# Patient Record
Sex: Female | Born: 1955
Health system: Southern US, Community
[De-identification: ages and names within clinical notes are randomized; demographics above are authoritative.]

## PROBLEM LIST (undated history)

## (undated) DIAGNOSIS — M1990 Unspecified osteoarthritis, unspecified site: Secondary | ICD-10-CM

## (undated) DIAGNOSIS — Z21 Asymptomatic human immunodeficiency virus [HIV] infection status: Secondary | ICD-10-CM

## (undated) DIAGNOSIS — E119 Type 2 diabetes mellitus without complications: Secondary | ICD-10-CM

## (undated) DIAGNOSIS — B20 Human immunodeficiency virus [HIV] disease: Secondary | ICD-10-CM

## (undated) DIAGNOSIS — R200 Anesthesia of skin: Secondary | ICD-10-CM

## (undated) DIAGNOSIS — M21372 Foot drop, left foot: Secondary | ICD-10-CM

## (undated) DIAGNOSIS — E785 Hyperlipidemia, unspecified: Secondary | ICD-10-CM

## (undated) DIAGNOSIS — K219 Gastro-esophageal reflux disease without esophagitis: Secondary | ICD-10-CM

## (undated) DIAGNOSIS — F419 Anxiety disorder, unspecified: Secondary | ICD-10-CM

## (undated) HISTORY — DX: Foot drop, left foot: M21.372

## (undated) HISTORY — PX: TONSILLECTOMY: SUR1361

## (undated) HISTORY — DX: Anesthesia of skin: R20.0

## (undated) HISTORY — DX: Human immunodeficiency virus (HIV) disease: B20

## (undated) HISTORY — PX: FOOT SURGERY: SHX648

## (undated) HISTORY — DX: Anxiety disorder, unspecified: F41.9

## (undated) HISTORY — DX: Unspecified osteoarthritis, unspecified site: M19.90

## (undated) HISTORY — PX: CHOLECYSTECTOMY: SHX55

## (undated) HISTORY — DX: Gastro-esophageal reflux disease without esophagitis: K21.9

## (undated) HISTORY — DX: Hyperlipidemia, unspecified: E78.5

---

## 1998-05-06 ENCOUNTER — Ambulatory Visit (HOSPITAL_COMMUNITY): Admission: RE | Admit: 1998-05-06 | Discharge: 1998-05-06 | Payer: Self-pay | Admitting: Family Medicine

## 1999-01-14 ENCOUNTER — Other Ambulatory Visit: Admission: RE | Admit: 1999-01-14 | Discharge: 1999-01-14 | Payer: Self-pay | Admitting: Obstetrics and Gynecology

## 1999-05-03 ENCOUNTER — Ambulatory Visit (HOSPITAL_COMMUNITY): Admission: RE | Admit: 1999-05-03 | Discharge: 1999-05-03 | Payer: Self-pay | Admitting: Obstetrics and Gynecology

## 1999-05-03 ENCOUNTER — Encounter (INDEPENDENT_AMBULATORY_CARE_PROVIDER_SITE_OTHER): Payer: Self-pay | Admitting: Specialist

## 1999-05-26 ENCOUNTER — Other Ambulatory Visit: Admission: RE | Admit: 1999-05-26 | Discharge: 1999-05-26 | Payer: Self-pay | Admitting: Obstetrics and Gynecology

## 1999-09-26 ENCOUNTER — Encounter: Payer: Self-pay | Admitting: Family Medicine

## 1999-09-26 ENCOUNTER — Encounter: Admission: RE | Admit: 1999-09-26 | Discharge: 1999-09-26 | Payer: Self-pay | Admitting: Family Medicine

## 2000-01-11 ENCOUNTER — Encounter (INDEPENDENT_AMBULATORY_CARE_PROVIDER_SITE_OTHER): Payer: Self-pay | Admitting: *Deleted

## 2000-01-11 ENCOUNTER — Encounter: Payer: Self-pay | Admitting: Gastroenterology

## 2000-01-11 ENCOUNTER — Ambulatory Visit (HOSPITAL_COMMUNITY): Admission: RE | Admit: 2000-01-11 | Discharge: 2000-01-11 | Payer: Self-pay | Admitting: Gastroenterology

## 2000-06-21 ENCOUNTER — Other Ambulatory Visit: Admission: RE | Admit: 2000-06-21 | Discharge: 2000-06-21 | Payer: Self-pay | Admitting: Obstetrics and Gynecology

## 2002-03-27 ENCOUNTER — Encounter: Payer: Self-pay | Admitting: Family Medicine

## 2002-03-27 ENCOUNTER — Ambulatory Visit (HOSPITAL_COMMUNITY): Admission: RE | Admit: 2002-03-27 | Discharge: 2002-03-27 | Payer: Self-pay | Admitting: Family Medicine

## 2002-04-29 ENCOUNTER — Ambulatory Visit (HOSPITAL_COMMUNITY): Admission: RE | Admit: 2002-04-29 | Discharge: 2002-04-29 | Payer: Self-pay | Admitting: Gastroenterology

## 2002-06-03 ENCOUNTER — Other Ambulatory Visit: Admission: RE | Admit: 2002-06-03 | Discharge: 2002-06-03 | Payer: Self-pay | Admitting: Obstetrics and Gynecology

## 2002-06-06 ENCOUNTER — Encounter: Admission: RE | Admit: 2002-06-06 | Discharge: 2002-06-06 | Payer: Self-pay | Admitting: Obstetrics and Gynecology

## 2002-06-06 ENCOUNTER — Encounter: Payer: Self-pay | Admitting: Obstetrics and Gynecology

## 2002-07-01 ENCOUNTER — Encounter: Payer: Self-pay | Admitting: *Deleted

## 2002-07-01 ENCOUNTER — Ambulatory Visit (HOSPITAL_COMMUNITY): Admission: RE | Admit: 2002-07-01 | Discharge: 2002-07-01 | Payer: Self-pay | Admitting: *Deleted

## 2003-02-07 ENCOUNTER — Encounter: Payer: Self-pay | Admitting: Family Medicine

## 2003-02-07 ENCOUNTER — Ambulatory Visit (HOSPITAL_COMMUNITY): Admission: RE | Admit: 2003-02-07 | Discharge: 2003-02-07 | Payer: Self-pay | Admitting: Family Medicine

## 2003-07-08 ENCOUNTER — Encounter: Payer: Self-pay | Admitting: Obstetrics and Gynecology

## 2003-07-08 ENCOUNTER — Encounter: Admission: RE | Admit: 2003-07-08 | Discharge: 2003-07-08 | Payer: Self-pay | Admitting: Obstetrics and Gynecology

## 2003-09-10 ENCOUNTER — Other Ambulatory Visit: Admission: RE | Admit: 2003-09-10 | Discharge: 2003-09-10 | Payer: Self-pay | Admitting: Obstetrics and Gynecology

## 2004-09-28 ENCOUNTER — Other Ambulatory Visit: Admission: RE | Admit: 2004-09-28 | Discharge: 2004-09-28 | Payer: Self-pay | Admitting: Obstetrics and Gynecology

## 2006-02-28 ENCOUNTER — Encounter: Admission: RE | Admit: 2006-02-28 | Discharge: 2006-02-28 | Payer: Self-pay | Admitting: Obstetrics and Gynecology

## 2009-05-06 ENCOUNTER — Encounter: Admission: RE | Admit: 2009-05-06 | Discharge: 2009-05-06 | Payer: Self-pay | Admitting: Family Medicine

## 2009-11-25 ENCOUNTER — Ambulatory Visit (HOSPITAL_COMMUNITY): Admission: RE | Admit: 2009-11-25 | Discharge: 2009-11-25 | Payer: Self-pay | Admitting: Gastroenterology

## 2009-12-06 ENCOUNTER — Encounter: Payer: Self-pay | Admitting: Infectious Diseases

## 2010-01-20 ENCOUNTER — Encounter (INDEPENDENT_AMBULATORY_CARE_PROVIDER_SITE_OTHER): Payer: Self-pay | Admitting: *Deleted

## 2010-01-20 DIAGNOSIS — M069 Rheumatoid arthritis, unspecified: Secondary | ICD-10-CM | POA: Insufficient documentation

## 2010-01-20 DIAGNOSIS — K738 Other chronic hepatitis, not elsewhere classified: Secondary | ICD-10-CM | POA: Insufficient documentation

## 2010-01-20 DIAGNOSIS — F411 Generalized anxiety disorder: Secondary | ICD-10-CM | POA: Insufficient documentation

## 2010-01-20 DIAGNOSIS — R740 Nonspecific elevation of levels of transaminase and lactic acid dehydrogenase [LDH]: Secondary | ICD-10-CM

## 2010-01-20 DIAGNOSIS — Z9089 Acquired absence of other organs: Secondary | ICD-10-CM | POA: Insufficient documentation

## 2010-01-20 DIAGNOSIS — J309 Allergic rhinitis, unspecified: Secondary | ICD-10-CM | POA: Insufficient documentation

## 2010-01-20 DIAGNOSIS — Z8719 Personal history of other diseases of the digestive system: Secondary | ICD-10-CM | POA: Insufficient documentation

## 2010-01-20 DIAGNOSIS — R7401 Elevation of levels of liver transaminase levels: Secondary | ICD-10-CM | POA: Insufficient documentation

## 2010-01-20 DIAGNOSIS — E7800 Pure hypercholesterolemia, unspecified: Secondary | ICD-10-CM | POA: Insufficient documentation

## 2010-02-02 ENCOUNTER — Ambulatory Visit: Payer: Self-pay | Admitting: Infectious Diseases

## 2010-02-02 LAB — CONVERTED CEMR LAB

## 2010-10-03 ENCOUNTER — Ambulatory Visit
Admission: RE | Admit: 2010-10-03 | Discharge: 2010-10-03 | Payer: Self-pay | Source: Home / Self Care | Attending: Infectious Diseases | Admitting: Infectious Diseases

## 2010-10-20 NOTE — Assessment & Plan Note (Signed)
Summary: TB MEDS/VS   History of Present Illness: 55 yo with RA dxed within the last 2 yearrs referred by for management of +PPD 02-02-10. At that time she was started on INH and her PCP was asked to check monthly LFTs on her (she also has a hx of chronic active hepatitis). She went to her home town in [place] and got records that showed her dad had TB but there were no records that she had been treated. Has not yet started on DMARD. She lost her INH rx since her last visit.    Preventive Screening-Counseling & Management  Alcohol-Tobacco     Alcohol drinks/day: 0     Smoking Status: never  Current Medications (verified): 1)  Hydroxychloroquine Sulfate 200 Mg Tabs (Hydroxychloroquine Sulfate) .... Take 1 Tablet By Mouth Two Times A Day Per Pcp 2)  Sulfasalazine 500 Mg Tabs (Sulfasalazine) .... Take 1 Tablet By Mouth Two Times A Day Per Pcp 3)  Centrum Ultra Womens  Tabs (Multiple Vitamins-Minerals) .... Take 1 Tablet By Mouth Once A Day Per Pcp 4)  Isoniazid 300 Mg Tabs (Isoniazid) .... One By Mouth Once Daily For 9 Months 5)  Pyridoxine Hcl 50 Mg Tabs (Pyridoxine Hcl) .... One By Mouth Once Daily For 9 Months  Allergies (verified): 1)  ! Codeine    Updated Prior Medication List: HYDROXYCHLOROQUINE SULFATE 200 MG TABS (HYDROXYCHLOROQUINE SULFATE) Take 1 tablet by mouth two times a day per PCP SULFASALAZINE 500 MG TABS (SULFASALAZINE) Take 1 tablet by mouth two times a day per PCP CENTRUM ULTRA WOMENS  TABS (MULTIPLE VITAMINS-MINERALS) Take 1 tablet by mouth once a day per PCP ISONIAZID 300 MG TABS (ISONIAZID) one by mouth once daily for 9 months PYRIDOXINE HCL 50 MG TABS (PYRIDOXINE HCL) one by mouth once daily for 9 months  Current Allergies (reviewed today): ! CODEINE Family History: father had TB mother demenita brother deceased - Leukemia sister htn  Social History: Lives with her husband.  Has a 31 yr daughter with 2 grandkids. tob - quit 12 yrs ago alcohol -  occas  Review of Systems       wt steady, no f/c. no cough, no sob.   Vital Signs:  Patient profile:   55 year old female Height:      67 inches (170.18 cm) Weight:      180 pounds (81.82 kg) BMI:     28.29 Temp:     98.0 degrees F (36.67 degrees C) oral Pulse rate:   84 / minute BP sitting:   120 / 80  (left arm)  Vitals Entered By: Starleen Arms CMA (October 03, 2010 3:46 PM)  Physical Exam  General:  well-developed, well-nourished, and well-hydrated.   Eyes:  pupils equal, pupils round, and pupils reactive to light.   Mouth:  pharynx pink and moist and no exudates.   Neck:  no masses.   Lungs:  normal respiratory effort and normal breath sounds.   Heart:  normal rate, regular rhythm, and no murmur.   Abdomen:  soft, non-tender, and normal bowel sounds.     Impression & Recommendations:  Problem # 1:  CONTACT WITH OR EXPOSURE TO TUBERCULOSIS (ICD-V01.1)  will re-write her medcations. she will get them filled at [facility]. she will cont to have her LFTs monitored by her PCP. will see her back in 3 months.   Orders: Est. Patient Level III (19147)  Problem # 2:  RHEUMATOID ARTHRITIS (ICD-714.0) can start DMARD once on rx for PPD+ .  Prescriptions: PYRIDOXINE HCL 50 MG TABS (PYRIDOXINE HCL) one by mouth once daily for 9 months  #30 x 8   Entered and Authorized by:   Johny Sax MD   Signed by:   Johny Sax MD on 10/03/2010   Method used:   Print then Give to Patient   RxID:   [id number] ISONIAZID 300 MG TABS (ISONIAZID) one by mouth once daily for 9 months  #30 x 8   Entered and Authorized by:   Johny Sax MD   Signed by:   Johny Sax MD on 10/03/2010   Method used:   Print then Give to Patient   RxID:   [id number]

## 2010-10-20 NOTE — Consult Note (Signed)
Summary: [facility]   Imported By: Florinda Marker 02/03/2010 16:54:36  _____________________________________________________________________  External Attachment:    Type:   Image     Comment:   External Document

## 2010-10-20 NOTE — Letter (Signed)
Summary: Generic on Letterhead Paper  [facility]  [address]   [place], [place] [address]   Phone: [phone]  Fax: [phone]     Today's Date:  Feb 02, 2010  Re:  [facility]   Dear Dr Tiburcio Pea, Mrs Lovan will be starting INH for latent TB infection prior to possibly starting a biological agent for her RA.  Given her underlying hepatitis history, I would suggest monthly LFTS.  Would you kindly arrange for this through your lab. Please call me with questions.    Sincerely,    Clydie Braun MD

## 2010-10-20 NOTE — Miscellaneous (Signed)
Summary: Problems, Medications and Allergies undated  Clinical Lists Changes  Problems: Added new problem of ANXIETY STATE, UNSPECIFIED (ICD-300.00) Added new problem of ALLERGIC RHINITIS CAUSE UNSPECIFIED (ICD-477.9) Added new problem of NONSPEC ELEVATION OF LEVELS OF TRANSAMINASE/LDH (ICD-790.4) Added new problem of PURE HYPERCHOLESTEROLEMIA (ICD-272.0) Added new problem of RHEUMATOID ARTHRITIS (ICD-714.0) Added new problem of GASTROESOPHAGEAL REFLUX DISEASE, HX OF (ICD-V12.79) Added new problem of CAH (ICD-571.49) Added new problem of CONTACT WITH OR EXPOSURE TO TUBERCULOSIS (ICD-V01.1) - hx of w/ father Added new problem of CHOLECYSTECTOMY, HX OF (ICD-V45.79) - 1990 Added new problem of TONSILLECTOMY, HX OF (ICD-V45.79) Medications: Added new medication of HYDROXYCHLOROQUINE SULFATE 200 MG TABS (HYDROXYCHLOROQUINE SULFATE) Take 1 tablet by mouth two times a day per PCP Added new medication of SULFASALAZINE 500 MG TABS (SULFASALAZINE) Take 1 tablet by mouth two times a day per PCP Added new medication of CENTRUM ULTRA WOMENS  TABS (MULTIPLE VITAMINS-MINERALS) Take 1 tablet by mouth once a day per PCP Allergies: Added new allergy or adverse reaction of CODEINE Observations: Added new observation of ALLERGY REV: Done (01/20/2010 14:49) Added new observation of NKA: F (01/20/2010 14:49)

## 2010-10-20 NOTE — Miscellaneous (Signed)
Summary: HIPAA Restrictions  HIPAA Restrictions   Imported By: Florinda Marker 02/03/2010 10:16:35  _____________________________________________________________________  External Attachment:    Type:   Image     Comment:   External Document

## 2010-10-20 NOTE — Assessment & Plan Note (Signed)
Summary: new pt inh therapy/   CC:  new patient / inh therapy.  History of Present Illness: 55 yo with RA dxed within the last 2 yearrs referred by Dr Eustace Quail for management of +PPD   Her father was dxed with TB when she was 15 and had a tb test done then.  She had a positive test at that time.  She says that the health department treated her with meds but not sure about the dosing, name or duration.  She had another PPD done at her PCPs and it was positive (Dr Neva Seat is her PCP).  she cannot ask her mother about this since she has dementia.  SHe also has a history of chroninc active hepatitis first dxed in 2001  Preventive Screening-Counseling & Management  Alcohol-Tobacco     Alcohol drinks/day: occassionally     Alcohol type: wine     Smoking Status: quit     Year Quit: 1999  Caffeine-Diet-Exercise     Caffeine use/day: coffee and tea     Does Patient Exercise: no  Safety-Violence-Falls     Seat Belt Use: yes   Current Allergies (reviewed today): ! CODEINE Past History:  Past Medical History: RA Chronic active hepatitis 0 s/p bxp 2001 with grade 3 inflammation and grade 2 fibrosis.   Positive Hepatitis B e antibody. Anxiety Hyperlipidemia Genella Rife  Past Surgical History: s.p chole d/c  tonsilectomy.  Family History: tb in her father mother demenita brother deceased - Leukemia sister htn  Social History: Not currently working but was at [employer], [employer] with her husband.  Has a 70 yr daughter with 2 grandkids. tob - quit 12 yrs ago alcohol - occas  Review of Systems       11 systems reviewed and negative except per HPI   Vital Signs:  Patient profile:   55 year old female Height:      67 inches (170.18 cm) Weight:      176.12 pounds (80.05 kg) BMI:     27.68 Temp:     98.5 degrees F (36.94 degrees C) oral Pulse rate:   97 / minute BP sitting:   147 / 91  (right arm)  Vitals Entered By: Baxter Hire) (Feb 02, 2010  2:10 PM) CC: new patient / inh therapy Pain Assessment Patient in pain? no      Nutritional Status BMI of 25 - 29 = overweight Nutritional Status Detail appetite is good per patient  Does patient need assistance? Functional Status Self care Ambulation Normal   Physical Exam  General:  alert, well-developed, and well-nourished.   Head:  normocephalic and atraumatic.   Eyes:  vision grossly intact, pupils equal, and pupils round.   Ears:  R ear normal and L ear normal.   Mouth:  good dentition.   Neck:  supple.   Lungs:  normal respiratory effort and normal breath sounds.   Heart:  normal rate and regular rhythm.   Abdomen:  soft, non-tender, no hepatomegaly, and no splenomegaly.   Msk:  normal ROM and no joint tenderness.  no active synovitis at this time Extremities:  no cce Neurologic:  alert & oriented X3 and cranial nerves II-XII intact.   Skin:  no rashes.   Cervical Nodes:  no anterior cervical adenopathy and no posterior cervical adenopathy.   Psych:  Oriented X3.   Additional Exam:  Liver bxp   Unfortunately, i do not have a specific diagnosis for the findings in this  liver biopsy. As you suggested, there are findings that indicate prior hepatic   injury, but it seem that whatever process caused these findings has essentially   resolved, leaving only traces of evidence of what appears to have been a   necrotizing injury pattern, given the extensive collections of debris-laden   macrophages. It looks as though the patient's liver enzymes have been returning   in normal levels as well, correlating with these histologic findings. While i   do not know exactly what caused the findings, i do not see any evidence of any   ongoing hepatic injury or disease. I also shared this case with my colleague,   dr. Shirlean Mylar, who agrees.  1. LIVER, NEEDLE/CORE BIOPSY, 3 18G CORES : FINDINGS SUGGESTIVE OF LARGELY   REVOLVED HEPATIC INJURY INCLUDING FOCAL PERIPORTAL AND  CHOLESTATIC CHANGE,   EXTENSIVE DEBRIS-LADEN MACROPHAGES IN PORTAL TRACTS AND LOBULES, AND SCATTERED   CHRONIC PORTAL INFLAMMATION, SEE COMMENT  most recent lfts 1/18 2011 ast 40 alt 50    Impression & Recommendations:  Problem # 1:  CONTACT WITH OR EXPOSURE TO TUBERCULOSIS (ICD-V01.1)  She has a very unclear history of treatment but definitely has a history of exposure as a teen .  I do not know how we can confirm if she has been treated in the past.  Would favor INH treatment for 9 months with monthly lfts. I will give RX and ask her pcp to do monthly LFTs.  Of note she has tried to get her records from the [place] [facility] and will try again tomorrow  when she is back visitng home.   Orders: Consultation Level IV ([address]109)  Problem # 2:  RHEUMATOID ARTHRITIS (ICD-714.0)  on plaquenil and sulfasalazine and seems to be doing relatively well.    Dr Corliss Skains plans to start biologics so will need to treat LTBI first.  Orders: Consultation Level IV (60454) T-HIV Antibody  (Reflex) ([id number])  Problem # 3:  NONSPEC ELEVATION OF LEVELS OF TRANSAMINASE/LDH (ICD-790.4)  s/p bxp x 2 and she has seen Dr Madilyn Fireman.  Per Dr Corliss Skains and my discussion, Dr Madilyn Fireman has given the go ahead to treat with meds as she has no active hepatitis at this time.  Will check monthly lfts on INH and stop if we see a >3times rise from baseline.  Fredia Beets given her a letter asking Dr Tiburcio Pea to check this montly   Orders: Consultation Level IV [phone]) T-HIV Antibody  (Reflex) [id number])  Medications Added to Medication List This Visit: 1)  Isoniazid 300 Mg Tabs (Isoniazid) .... One by mouth once daily for 9 months 2)  Pyridoxine Hcl 50 Mg Tabs (Pyridoxine hcl) .... One by mouth once daily for 9 months  Patient Instructions: 1)  Start INH and pyridoxine daily for the next 9 months. 2)  Get monthly liver tests at your primary doctors office. 3)  Call if you have any abdominal pain, nausea, vomiting, yellowing of the  eyes or skin, fevers, weight loss, rash or other concerning symptoms. 4)  Follow up as needed if any of the above occur Prescriptions: PYRIDOXINE HCL 50 MG TABS (PYRIDOXINE HCL) one by mouth once daily for 9 months  #30 x 8   Entered and Authorized by:   Clydie Braun MD   Signed by:   Clydie Braun MD on 02/02/2010   Method used:   Print then Give to Patient   RxID:   [id number] ISONIAZID 300 MG TABS (ISONIAZID) one by mouth once daily for  9 months  #30 x 8   Entered and Authorized by:   Clydie Braun MD   Signed by:   Clydie Braun MD on 02/02/2010   Method used:   Print then Give to Patient   RxID:   [id number]

## 2010-11-01 ENCOUNTER — Other Ambulatory Visit: Payer: Self-pay | Admitting: Obstetrics and Gynecology

## 2010-11-04 ENCOUNTER — Telehealth: Payer: Self-pay | Admitting: Infectious Diseases

## 2010-11-29 ENCOUNTER — Encounter: Payer: Self-pay | Admitting: *Deleted

## 2010-12-06 NOTE — Progress Notes (Signed)
  Phone Note Call from Patient   Summary of Call: Patient needs order to have her LFT's checked monthly at [facility] due to her elevated LFT's and being on INH therapy Initial call taken by: Starleen Arms CMA,  November 04, 2010 9:58 AM  Follow-up for Phone Call        please check LFT monthly for INH therapy

## 2010-12-09 LAB — PROTIME-INR
INR: 0.89 (ref 0.00–1.49)
Prothrombin Time: 12 seconds (ref 11.6–15.2)

## 2010-12-09 LAB — CBC
Hemoglobin: 13.9 g/dL (ref 12.0–15.0)
MCHC: 35.1 g/dL (ref 30.0–36.0)
RDW: 12.9 % (ref 11.5–15.5)
WBC: 4.8 10*3/uL (ref 4.0–10.5)

## 2010-12-09 LAB — APTT: aPTT: 24 seconds (ref 24–37)

## 2011-02-03 NOTE — Op Note (Signed)
   Courtney Escobar, Courtney Escobar                       ACCOUNT NO.:  [id number]   MEDICAL RECORD NO.:  [id number]                   PATIENT TYPE:  AMB   LOCATION:  ENDO                                 FACILITY:  [facility]   PHYSICIAN:  Barrie Folk, M.D.                  DATE OF BIRTH:  [birth year]   DATE OF PROCEDURE:  04/29/2002  DATE OF DISCHARGE:                                 OPERATIVE REPORT   PROCEDURE:  Esophagogastroduodenoscopy.   INDICATION FOR PROCEDURE:  Recurrent chest pain and a burning sensation in  her throat only partly improved by Aciphex.   DESCRIPTION OF PROCEDURE:  The patient was placed in the left lateral  decubitus position and placed on the pulse monitor with continuous low-flow  oxygen delivered by nasal cannula.  She was sedated with 60 mg IV Demerol  and 6 mg IV Versed.  The Olympus video endoscope was advanced under direct  vision into the oropharynx and esophagus.  The esophagus was relatively  straight and of normal caliber with the squamocolumnar line at 38 cm above a  small 1-1.5 cm hiatal hernia.  There also appeared to be an incomplete ring  that was visible over about 30-40% of the circumference of the lower  esophagus but was not completely circumferential with no significant  narrowing of the lumen at that point.  There was no visible esophagitis,  esophageal ulcer or other abnormality of the esophagus or GE junction.  The  stomach was entered, and a small amount of liquid secretions were suctioned  from the fundus.  Retroflexed view of the cardia was unremarkable.  The  fundus, body, antrum, and pylorus all appeared normal.  The duodenum was  entered, and both the bulb and second portion were well-inspected and  appeared to be within normal limits.  The scope was then withdrawn, and the  patient returned to the recovery room in stable condition.  She tolerated  the procedure well, and there were no immediate complications.   IMPRESSION:  1. Small  hiatal hernia.  2. Incomplete lower esophageal ring.   PLAN:  Continue Aciphex and keep appointment that has been made with her  otolaryngologist for further evaluation of her burning sensation in her  throat.                                               Barrie Folk, M.D.    JCH/MEDQ  D:  04/29/2002  T:  04/30/2002  Job:  [id number]   cc:   Jerel Shepherd. Ezzard Standing, M.D.

## 2012-02-16 ENCOUNTER — Telehealth: Payer: Self-pay | Admitting: *Deleted

## 2012-02-16 NOTE — Telephone Encounter (Signed)
She is seeing Dr. Titus Dubin for RA. Wants to try Enbrel but the md wants clearance from [place] that she has completed her TB meds & is ok to take thi sdrug. Needs a letter from her md here sent to RA md. Phone is [phone]   Will fax when we get the letter

## 2012-02-19 NOTE — Telephone Encounter (Signed)
Dr. Ninetta Lights asked that thi spt get the letter from the health dept. I called & left her a message to call me back

## 2012-03-05 ENCOUNTER — Telehealth: Payer: Self-pay | Admitting: *Deleted

## 2012-03-05 NOTE — Telephone Encounter (Signed)
She states the health department was never involved with her care, so they cannot provide a letter.  Says Dr. Jovita Gamma her the meds here & she took all 9 months. Her RA md appt is the 28th & wants a letter before then so she can try Enbrel. I told her I will ask md again but he is out of town until later this month

## 2012-03-15 ENCOUNTER — Ambulatory Visit
Admission: RE | Admit: 2012-03-15 | Discharge: 2012-03-15 | Disposition: A | Discharge status: Home / Self Care | Payer: BC Managed Care – PPO | Source: Ambulatory Visit | Attending: Rheumatology | Admitting: Rheumatology

## 2012-03-15 ENCOUNTER — Other Ambulatory Visit: Payer: Self-pay | Admitting: Rheumatology

## 2012-03-15 DIAGNOSIS — Z111 Encounter for screening for respiratory tuberculosis: Secondary | ICD-10-CM

## 2012-05-22 ENCOUNTER — Ambulatory Visit (INDEPENDENT_AMBULATORY_CARE_PROVIDER_SITE_OTHER): Payer: BC Managed Care – PPO | Admitting: Physician Assistant

## 2012-05-22 VITALS — BP 120/80 | HR 131 | Temp 102.2°F | Resp 16 | Ht 66.0 in | Wt 169.6 lb

## 2012-05-22 DIAGNOSIS — R319 Hematuria, unspecified: Secondary | ICD-10-CM

## 2012-05-22 DIAGNOSIS — R8271 Bacteriuria: Secondary | ICD-10-CM

## 2012-05-22 DIAGNOSIS — R350 Frequency of micturition: Secondary | ICD-10-CM

## 2012-05-22 DIAGNOSIS — R82998 Other abnormal findings in urine: Secondary | ICD-10-CM

## 2012-05-22 DIAGNOSIS — J329 Chronic sinusitis, unspecified: Secondary | ICD-10-CM

## 2012-05-22 LAB — POCT URINALYSIS DIPSTICK
Glucose, UA: NEGATIVE
Ketones, UA: 15
Urobilinogen, UA: >=8

## 2012-05-22 LAB — POCT UA - MICROSCOPIC ONLY
Crystals, Ur, HPF, POC: NEGATIVE
Mucus, UA: NEGATIVE
Yeast, UA: NEGATIVE

## 2012-05-22 MED ORDER — LEVOFLOXACIN 500 MG PO TABS: 500.0000 mg | ORAL_TABLET | Freq: Every day | ORAL | Status: AC

## 2012-05-22 MED ORDER — PHENAZOPYRIDINE HCL 200 MG PO TABS: 200.0000 mg | ORAL_TABLET | Freq: Three times a day (TID) | ORAL | Status: AC | PRN

## 2012-05-22 MED ORDER — IPRATROPIUM BROMIDE 0.03 % NA SOLN: 2.0000 | Freq: Two times a day (BID) | NASAL | Status: DC

## 2012-05-22 NOTE — Progress Notes (Signed)
Subjective:    Patient ID: Courtney Escobar, female    DOB: 1956/04/03, 56 y.o.   MRN: [id number]  HPI This 56 y.o. female presents for evaluation of HA, gum pain, ear pain, "sinus."  Symptoms began 05/19/2012 in the evening.  Subjective fever, bad taste in her mouth, no nausea, vomiting, diarrhea; increased urinary frequency and darker urine than usual. No coughing, no ST, no congestion.    Review of Systems  Past Medical History  Diagnosis Date  . Arthritis   . Anxiety   . GERD (gastroesophageal reflux disease)     due to anxiety  . Hyperlipidemia     Past Surgical History  Procedure Date  . Tonsillectomy   . Foot surgery   . Cholecystectomy     Prior to Admission medications   Medication Sig Start Date End Date Taking? Authorizing Provider  etanercept (ENBREL) 50 MG/ML injection Inject 50 mg into the skin once a week.   Yes Historical Provider, MD  hydroxychloroquine (PLAQUENIL) 200 MG tablet Take 200 mg by mouth 2 (two) times daily. Per pcp    Yes Historical Provider, MD  sulfaDIAZINE 500 MG tablet Take 500 mg by mouth 2 (two) times daily. Per pcp    Yes Historical Provider, MD  Multiple Vitamins-Minerals (CENTRUM ULTRA WOMENS) TABS Take 1 tablet by mouth daily. Per pcp     Historical Provider, MD  pyridoxine (B-6) 50 MG tablet Take 50 mg by mouth daily. For 9 months     Historical Provider, MD    Allergies  Allergen Reactions  . Codeine     REACTION: itching    History   Social History  . Marital Status: Married    Spouse Name: Asher Muir    Number of Children: 1  . Years of Education: 12   Occupational History  . Inspect and Secondary school teacher   Social History Main Topics  . Smoking status: Former Smoker    Types: Cigarettes    Quit date: 05/22/1996  . Smokeless tobacco: Never Used  . Alcohol Use: 0.6 oz/week    1 Cans of beer per week     occasionally, on the weekend  . Drug Use: No  . Sexually Active: Not Currently -- Female partner(s)   Birth Control/ Protection: Post-menopausal   Other Topics Concern  . Not on file   Social History Narrative  . No narrative on file    Family History  Problem Relation Age of Onset  . Hypertension Sister   . Arthritis Brother     ?rheumatoid arthritis, dermal changes  . Cancer Brother        Objective:   Physical Exam Blood pressure 120/80, pulse 131, temperature 102.2 F (39 C), temperature source Oral, resp. rate 16, height 5\' 6"  (1.676 m), weight 169 lb 9.6 oz (76.93 kg), SpO2 98.00%. Body mass index is 27.37 kg/(m^2). Well-developed, well nourished BF who is awake, alert and oriented, in NAD, but who obviously doesn't feel well. HEENT: [place]/AT, sclera and conjunctiva are clear.  EAC are patent, TMs are normal in appearance. Nasal mucosa is pink and moist. OP is clear.  Sinuses are non-tender on palpation. Neck: supple, non-tender, no lymphadenopathy, thyromegaly. Heart: RRR, no murmur Lungs: normal effort, CTA Extremities: no cyanosis, clubbing or edema. Skin: warm and dry without rash.  Results for orders placed in visit on 05/22/12  POCT UA - MICROSCOPIC ONLY      Component Value Range   WBC, Ur, HPF, POC TNTC  RBC, urine, microscopic TNTC     Bacteria, U Microscopic 4+     Mucus, UA negative     Epithelial cells, urine per micros 1-3     Crystals, Ur, HPF, POC negative     Casts, Ur, LPF, POC negative     Yeast, UA negative    POCT URINALYSIS DIPSTICK      Component Value Range   Color, UA orange     Clarity, UA turbid     Glucose, UA negative     Bilirubin, UA moderate     Ketones, UA 15     Spec Grav, UA 1.015     Blood, UA large     pH, UA 6.5     Protein, UA 100     Urobilinogen, UA >=8.0     Nitrite, UA positive     Leukocytes, UA large (3+)        Assessment & Plan:   1. Sinusitis  levofloxacin (LEVAQUIN) 500 MG tablet, ipratropium (ATROVENT) 0.03 % nasal spray  2. Urinary frequency  POCT UA - Microscopic Only, POCT urinalysis dipstick,  phenazopyridine (PYRIDIUM) 200 MG tablet  3. Bacteriuria with pyuria  Urine culture, levofloxacin (LEVAQUIN) 500 MG tablet  4. Hematuria  RTC for repeat UA in 2 weeks.   Levaquin is selected to cover for both Sinusitis and UTI.  She will hold the Enbrel until she completes the antibiotic therapy. OOW today and tomorrow, return to work 05/27/2012.

## 2012-05-25 LAB — URINE CULTURE

## 2013-11-21 ENCOUNTER — Other Ambulatory Visit: Payer: Self-pay | Admitting: Rheumatology

## 2013-11-21 ENCOUNTER — Ambulatory Visit
Admission: RE | Admit: 2013-11-21 | Discharge: 2013-11-21 | Disposition: A | Discharge status: Home / Self Care | Payer: Managed Care, Other (non HMO) | Source: Ambulatory Visit | Attending: Rheumatology | Admitting: Rheumatology

## 2013-11-21 DIAGNOSIS — A150 Tuberculosis of lung: Secondary | ICD-10-CM

## 2014-04-16 ENCOUNTER — Other Ambulatory Visit: Payer: Self-pay | Admitting: Obstetrics and Gynecology

## 2014-04-17 LAB — CYTOLOGY - PAP

## 2014-06-19 ENCOUNTER — Other Ambulatory Visit: Payer: Self-pay | Admitting: Dermatology

## 2014-07-23 ENCOUNTER — Other Ambulatory Visit: Payer: Self-pay | Admitting: Dermatology

## 2014-09-01 ENCOUNTER — Other Ambulatory Visit: Payer: Self-pay | Admitting: Gastroenterology

## 2014-09-01 DIAGNOSIS — R945 Abnormal results of liver function studies: Principal | ICD-10-CM

## 2014-09-01 DIAGNOSIS — R7989 Other specified abnormal findings of blood chemistry: Secondary | ICD-10-CM

## 2014-09-04 ENCOUNTER — Ambulatory Visit
Admission: RE | Admit: 2014-09-04 | Discharge: 2014-09-04 | Disposition: A | Discharge status: Home / Self Care | Payer: Managed Care, Other (non HMO) | Source: Ambulatory Visit | Attending: Gastroenterology | Admitting: Gastroenterology

## 2014-09-04 DIAGNOSIS — R7989 Other specified abnormal findings of blood chemistry: Secondary | ICD-10-CM

## 2014-09-04 DIAGNOSIS — R945 Abnormal results of liver function studies: Principal | ICD-10-CM

## 2015-02-17 ENCOUNTER — Other Ambulatory Visit: Payer: Self-pay | Admitting: Gastroenterology

## 2015-02-17 DIAGNOSIS — R7402 Elevation of levels of lactic acid dehydrogenase (LDH): Secondary | ICD-10-CM

## 2015-02-17 DIAGNOSIS — R7401 Elevation of levels of liver transaminase levels: Secondary | ICD-10-CM

## 2015-02-17 DIAGNOSIS — R740 Nonspecific elevation of levels of transaminase and lactic acid dehydrogenase [LDH]: Principal | ICD-10-CM

## 2015-02-26 ENCOUNTER — Ambulatory Visit
Admission: RE | Admit: 2015-02-26 | Discharge: 2015-02-26 | Disposition: A | Discharge status: Home / Self Care | Payer: Managed Care, Other (non HMO) | Source: Ambulatory Visit | Attending: Gastroenterology | Admitting: Gastroenterology

## 2015-02-26 DIAGNOSIS — R740 Nonspecific elevation of levels of transaminase and lactic acid dehydrogenase [LDH]: Principal | ICD-10-CM

## 2015-02-26 DIAGNOSIS — R7402 Elevation of levels of lactic acid dehydrogenase (LDH): Secondary | ICD-10-CM

## 2015-02-26 DIAGNOSIS — R7401 Elevation of levels of liver transaminase levels: Secondary | ICD-10-CM

## 2015-02-26 MED ORDER — GADOBENATE DIMEGLUMINE 529 MG/ML IV SOLN
14.0000 mL | Freq: Once | INTRAVENOUS | Status: AC | PRN
  Administered 2015-02-26: 14 mL via INTRAVENOUS

## 2015-03-09 ENCOUNTER — Ambulatory Visit
Admission: RE | Admit: 2015-03-09 | Discharge: 2015-03-09 | Disposition: A | Discharge status: Home / Self Care | Payer: Managed Care, Other (non HMO) | Source: Ambulatory Visit | Attending: Family Medicine | Admitting: Family Medicine

## 2015-03-09 ENCOUNTER — Other Ambulatory Visit: Payer: Self-pay | Admitting: Family Medicine

## 2015-03-09 DIAGNOSIS — A150 Tuberculosis of lung: Secondary | ICD-10-CM

## 2015-03-16 ENCOUNTER — Encounter: Payer: Self-pay | Admitting: Infectious Diseases

## 2015-03-16 ENCOUNTER — Encounter: Payer: Self-pay | Admitting: *Deleted

## 2015-03-16 ENCOUNTER — Ambulatory Visit (INDEPENDENT_AMBULATORY_CARE_PROVIDER_SITE_OTHER): Payer: 59 | Admitting: Infectious Diseases

## 2015-03-16 VITALS — BP 122/83 | HR 139 | Temp 97.8°F | Ht 67.0 in | Wt 145.0 lb

## 2015-03-16 DIAGNOSIS — H6120 Impacted cerumen, unspecified ear: Secondary | ICD-10-CM | POA: Insufficient documentation

## 2015-03-16 DIAGNOSIS — H6123 Impacted cerumen, bilateral: Secondary | ICD-10-CM

## 2015-03-16 DIAGNOSIS — B20 Human immunodeficiency virus [HIV] disease: Secondary | ICD-10-CM | POA: Insufficient documentation

## 2015-03-16 DIAGNOSIS — Z23 Encounter for immunization: Secondary | ICD-10-CM | POA: Diagnosis not present

## 2015-03-16 DIAGNOSIS — Z113 Encounter for screening for infections with a predominantly sexual mode of transmission: Secondary | ICD-10-CM

## 2015-03-16 DIAGNOSIS — E119 Type 2 diabetes mellitus without complications: Secondary | ICD-10-CM | POA: Diagnosis not present

## 2015-03-16 DIAGNOSIS — Z79899 Other long term (current) drug therapy: Secondary | ICD-10-CM

## 2015-03-16 LAB — COMPREHENSIVE METABOLIC PANEL
ALT: 43 U/L — AB (ref 0–35)
AST: 93 U/L — ABNORMAL HIGH (ref 0–37)
Albumin: 2.5 g/dL — ABNORMAL LOW (ref 3.5–5.2)
Alkaline Phosphatase: 553 U/L — ABNORMAL HIGH (ref 39–117)
BUN: 8 mg/dL (ref 6–23)
CALCIUM: 8.2 mg/dL — AB (ref 8.4–10.5)
CHLORIDE: 99 meq/L (ref 96–112)
CO2: 25 meq/L (ref 19–32)
CREATININE: 0.51 mg/dL (ref 0.50–1.10)
Glucose, Bld: 137 mg/dL — ABNORMAL HIGH (ref 70–99)
Potassium: 3.5 mEq/L (ref 3.5–5.3)
Sodium: 136 mEq/L (ref 135–145)
Total Bilirubin: 2.4 mg/dL — ABNORMAL HIGH (ref 0.2–1.2)
Total Protein: 6.2 g/dL (ref 6.0–8.3)

## 2015-03-16 LAB — LIPID PANEL
CHOLESTEROL: 195 mg/dL (ref 0–200)
HDL: 26 mg/dL — ABNORMAL LOW (ref >=46)
LDL CALC: 143 mg/dL — AB (ref 0–99)
Total CHOL/HDL Ratio: 7.5 Ratio
Triglycerides: 130 mg/dL (ref <150)
VLDL: 26 mg/dL (ref 0–40)

## 2015-03-16 LAB — HEPATITIS B SURFACE ANTIGEN: HEP B S AG: NEGATIVE

## 2015-03-16 LAB — CBC
HEMATOCRIT: 32 % — AB (ref 36.0–46.0)
Hemoglobin: 11.1 g/dL — ABNORMAL LOW (ref 12.0–15.0)
MCH: 31.6 pg (ref 26.0–34.0)
MCHC: 34.7 g/dL (ref 30.0–36.0)
MCV: 91.2 fL (ref 78.0–100.0)
MPV: 10.4 fL (ref 8.6–12.4)
PLATELETS: 206 10*3/uL (ref 150–400)
RBC: 3.51 MIL/uL — ABNORMAL LOW (ref 3.87–5.11)
RDW: 13.9 % (ref 11.5–15.5)
WBC: 7.3 10*3/uL (ref 4.0–10.5)

## 2015-03-16 LAB — HEMOGLOBIN A1C
HEMOGLOBIN A1C: 5.4 % (ref <5.7)
MEAN PLASMA GLUCOSE: 108 mg/dL (ref <117)

## 2015-03-16 LAB — HEPATITIS C ANTIBODY: HCV Ab: NEGATIVE

## 2015-03-16 LAB — HEPATITIS B SURFACE ANTIBODY,QUALITATIVE: HEP B S AB: POSITIVE — AB

## 2015-03-16 LAB — HEPATITIS A ANTIBODY, TOTAL: Hep A Total Ab: NONREACTIVE

## 2015-03-16 LAB — HEPATITIS B CORE ANTIBODY, TOTAL: Hep B Core Total Ab: REACTIVE — AB

## 2015-03-16 MED ORDER — AZITHROMYCIN 600 MG PO TABS: 1200.0000 mg | ORAL_TABLET | ORAL | Status: DC

## 2015-03-16 MED ORDER — SULFAMETHOXAZOLE-TRIMETHOPRIM 400-80 MG PO TABS: 1.0000 | ORAL_TABLET | ORAL | Status: DC

## 2015-03-16 MED ORDER — DOLUTEGRAVIR SODIUM 50 MG PO TABS: 50.0000 mg | ORAL_TABLET | Freq: Every day | ORAL | Status: DC

## 2015-03-16 MED ORDER — EMTRICITABINE-TENOFOVIR AF 200-25 MG PO TABS: 1.0000 | ORAL_TABLET | Freq: Every day | ORAL | Status: DC

## 2015-03-16 NOTE — Progress Notes (Signed)
Subjective:    Patient ID: Courtney Escobar, female    DOB: 1955-11-16, 59 y.o.   MRN: [id number]  HPI 59 yo F who was seen previously in ID for LTBI in 2011 (HIV - then) as she was being screened for embrel (off since 05-2014). Her father had TB. She was treated with INH at that time. She also has hx of RA, elevated lipids, chronic hepatitis (elevated alk phos, MR abd 02-27-15 normal). Has had 2 negative liver Bx per pt.  She is married and has only been sexually active with one other person ~5 yr ago when she and her husband separated.  She was seen by her PCP 6-21 and had Glc of 200 and was noted to have new HIV+ (check as she had unexplained wt loss ~30# over 6 months). She was also noted to have an oral ulcer and thrush.   Her CD4 was found to be 12.   PMHx, Soc, FHx reviewed.   Review of Systems  Constitutional: Positive for appetite change and unexpected weight change.  HENT: Positive for ear pain and mouth sores.   Respiratory: Positive for cough. Negative for shortness of breath.   Gastrointestinal: Negative for diarrhea and constipation.  Genitourinary: Positive for frequency. Negative for menstrual problem.  mennorheic > 5 yrs.Last pap 04-2014     Objective:   Physical Exam  Constitutional: She appears well-developed and well-nourished.  HENT:  Ears:  Mouth/Throat: No oropharyngeal exudate.  Eyes: EOM are normal. Pupils are equal, round, and reactive to light.  Neck: Neck supple.  Cardiovascular: Normal rate, regular rhythm and normal heart sounds.   Pulmonary/Chest: Effort normal and breath sounds normal.  Abdominal: Soft. Bowel sounds are normal. There is no tenderness.  Musculoskeletal: She exhibits no edema.  Lymphadenopathy:    She has no cervical adenopathy.       Assessment & Plan:

## 2015-03-16 NOTE — Addendum Note (Signed)
Addended by: Myrtis Hopping A on: 03/16/2015 10:14 AM   Modules accepted: Orders

## 2015-03-16 NOTE — Assessment & Plan Note (Signed)
Will lavage

## 2015-03-16 NOTE — Assessment & Plan Note (Signed)
Will check her A1C, have her seen by PCP asap

## 2015-03-16 NOTE — Progress Notes (Signed)
Patient ID: Courtney Escobar, female   DOB: 1956/06/12, 59 y.o.   MRN: [id number] HPI: Courtney Escobar is a 59 y.o. female who was newly dx and is here for her initial visit.   Allergies: Allergies  Allergen Reactions  . Codeine     REACTION: itching    Vitals: Temp: 97.8 F (36.6 C) (06/28 0856) Temp Source: Oral (06/28 0856) BP: 122/83 mmHg (06/28 0856) Pulse Rate: 139 (06/28 0856)  Past Medical History: Past Medical History  Diagnosis Date  . Arthritis   . Anxiety   . GERD (gastroesophageal reflux disease)     due to anxiety  . Hyperlipidemia   . Acquired immune deficiency syndrome     Social History: History   Social History  . Marital Status: Married    Spouse Name: [name]  . Number of Children: 1  . Years of Education: 12   Occupational History  . 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 Smoking status: Former Smoker    Types: Cigarettes    Quit date: 05/22/1996  . Smokeless tobacco: Never Used  . Alcohol Use: No     Comment: none since 10/19/14  . Drug Use: No  . Sexual Activity:    Partners: Male    Birth Control/ Protection: Post-menopausal   Other Topics Concern  . Not on file   Social History Narrative    Previous Regimen: Naive  Current Regimen: None  Labs: No results found for: HIV1RNAQUANT, HIV1RNAVL, CD4TABS, HEPBSAB, HEPBSAG, HCVAB  CrCl: CrCl cannot be calculated (Patient has no serum creatinine result on file.).  Lipids: No results found for: CHOL, TRIG, HDL, CHOLHDL, VLDL, LDLCALC  Assessment: She was seen here a few years ago for TB screening for Enbrel. She was tested neg for HIV at that time. Her recent visit with her PCP found to be positive for HIV. Her CD4 was apparently 12. She prob has HIV for at least several years now. We are planning to start her on ART, PCP and MAC prophylaxis at this point. She does have insurance but it's [employer] so Genvoya will be an issue. We are deciding to use DTG +  TRV instead due to insurance issue. After the discussion with her, taking meds is not an issue with her. Really stressed the importance of adherence here.   Recommendations: Tivicay 50mg  PO qday Truvaday 1 PO qday Azith 1200mg  PO qwk Septra DS 1 PO qday  Wilfred Lacy, PharmD Clinical [facility] 03/16/2015, 2:13 PM

## 2015-03-16 NOTE — Assessment & Plan Note (Addendum)
Will recheck all her labs Will start on bactrim TIW Will start her on genvoya Husband needs to be tested Will see her back in 1 month Offered/refused condoms.  Start vax series in 1 month as serologies indicate, start pnvx today I do not see thrush today.

## 2015-03-17 LAB — URINE CYTOLOGY ANCILLARY ONLY
CHLAMYDIA, DNA PROBE: NEGATIVE
NEISSERIA GONORRHEA: NEGATIVE

## 2015-03-17 LAB — URINALYSIS, ROUTINE W REFLEX MICROSCOPIC
GLUCOSE, UA: NEGATIVE mg/dL
Hgb urine dipstick: NEGATIVE
Ketones, ur: NEGATIVE mg/dL
Leukocytes, UA: NEGATIVE
Nitrite: NEGATIVE
PH: 7 (ref 5.0–8.0)
Protein, ur: NEGATIVE mg/dL
SPECIFIC GRAVITY, URINE: 1.011 (ref 1.005–1.030)
Urobilinogen, UA: 2 mg/dL — ABNORMAL HIGH (ref 0.0–1.0)

## 2015-03-17 LAB — RPR

## 2015-03-17 LAB — T-HELPER CELL (CD4) - (RCID CLINIC ONLY)
CD4 % Helper T Cell: <1 % — ABNORMAL LOW (ref 33–55)
CD4 T Cell Abs: <10 /uL — ABNORMAL LOW (ref 400–2700)

## 2015-03-17 LAB — HIV-1 RNA ULTRAQUANT REFLEX TO GENTYP+
HIV 1 RNA Quant: 118978 copies/mL — ABNORMAL HIGH (ref <20)
HIV-1 RNA Quant, Log: 5.08 {Log} — ABNORMAL HIGH (ref <1.30)

## 2015-03-19 ENCOUNTER — Telehealth: Payer: Self-pay | Admitting: *Deleted

## 2015-03-19 NOTE — Telephone Encounter (Signed)
What can I do about my cough?  Pt was started on Bactrim DS and Azithromycin at [facility] on 03/17/15.  RN advised to to take OTC Robitussin or Mucinex per the package instructions.  Pt verbalized understanding.

## 2015-03-23 LAB — HLA B*5701: HLA-B*5701 w/rflx HLA-B High: NEGATIVE

## 2015-03-24 ENCOUNTER — Telehealth: Payer: Self-pay | Admitting: *Deleted

## 2015-03-24 LAB — HIV-1 INTEGRASE GENOTYPE

## 2015-03-24 NOTE — Telephone Encounter (Signed)
Requesting to discuss her HIV medications possible side effects with pharmacist.  Pt gets off work at 4 PM. ([phone])   Pt was told by her PCP, Dr. Minette Brine at [facility], that since Dr. Johnnye Sima is seeing her for new HIV diagnosis that Dr. Kenton Kingfisher is turning over her primary care to Dr. Johnnye Sima.  Pt unclear about how that "works."

## 2015-03-25 ENCOUNTER — Telehealth: Payer: Self-pay | Admitting: Pharmacist Clinician (PhC)/ Clinical Pharmacy Specialist

## 2015-03-25 NOTE — Telephone Encounter (Signed)
Courtney Escobar called yesterday and asked about the side effects for her new ART. Just described some mild side effects with her regimen. It should be minimal.

## 2015-03-26 NOTE — Telephone Encounter (Signed)
Patient called back this morning to ask if Dr Johnnye Sima has addressed her issue with Dr Kenton Kingfisher. Advised her no but that there is nothing we can do if Dr Kenton Kingfisher will not treat her any longer. She will have to find a new PCP she needs to find a doctor to help with her DM. Advised her will mail her out a list of PCP's in the area she can call and see if they are taking new patients and make an appt.

## 2015-03-27 ENCOUNTER — Inpatient Hospital Stay (HOSPITAL_COMMUNITY)
Admission: EM | Admit: 2015-03-27 | Discharge: 2015-04-02 | DRG: 974 | Disposition: A | Discharge status: Home Health | Payer: 59 | Attending: Internal Medicine | Admitting: Internal Medicine

## 2015-03-27 ENCOUNTER — Emergency Department (HOSPITAL_COMMUNITY): Payer: 59

## 2015-03-27 ENCOUNTER — Encounter (HOSPITAL_COMMUNITY): Payer: Self-pay

## 2015-03-27 DIAGNOSIS — E43 Unspecified severe protein-calorie malnutrition: Secondary | ICD-10-CM | POA: Diagnosis present

## 2015-03-27 DIAGNOSIS — E1165 Type 2 diabetes mellitus with hyperglycemia: Secondary | ICD-10-CM | POA: Diagnosis present

## 2015-03-27 DIAGNOSIS — G9341 Metabolic encephalopathy: Secondary | ICD-10-CM | POA: Diagnosis present

## 2015-03-27 DIAGNOSIS — R6521 Severe sepsis with septic shock: Secondary | ICD-10-CM | POA: Diagnosis present

## 2015-03-27 DIAGNOSIS — Z9911 Dependence on respirator [ventilator] status: Secondary | ICD-10-CM | POA: Diagnosis not present

## 2015-03-27 DIAGNOSIS — J181 Lobar pneumonia, unspecified organism: Secondary | ICD-10-CM | POA: Diagnosis not present

## 2015-03-27 DIAGNOSIS — Z79899 Other long term (current) drug therapy: Secondary | ICD-10-CM

## 2015-03-27 DIAGNOSIS — E872 Acidosis: Secondary | ICD-10-CM | POA: Diagnosis present

## 2015-03-27 DIAGNOSIS — B20 Human immunodeficiency virus [HIV] disease: Secondary | ICD-10-CM | POA: Diagnosis present

## 2015-03-27 DIAGNOSIS — T380X5A Adverse effect of glucocorticoids and synthetic analogues, initial encounter: Secondary | ICD-10-CM | POA: Diagnosis present

## 2015-03-27 DIAGNOSIS — B59 Pneumocystosis: Secondary | ICD-10-CM | POA: Diagnosis not present

## 2015-03-27 DIAGNOSIS — K219 Gastro-esophageal reflux disease without esophagitis: Secondary | ICD-10-CM | POA: Diagnosis present

## 2015-03-27 DIAGNOSIS — A419 Sepsis, unspecified organism: Secondary | ICD-10-CM | POA: Diagnosis not present

## 2015-03-27 DIAGNOSIS — D72829 Elevated white blood cell count, unspecified: Secondary | ICD-10-CM | POA: Diagnosis present

## 2015-03-27 DIAGNOSIS — M1990 Unspecified osteoarthritis, unspecified site: Secondary | ICD-10-CM | POA: Diagnosis present

## 2015-03-27 DIAGNOSIS — E861 Hypovolemia: Secondary | ICD-10-CM | POA: Diagnosis present

## 2015-03-27 DIAGNOSIS — Z8249 Family history of ischemic heart disease and other diseases of the circulatory system: Secondary | ICD-10-CM

## 2015-03-27 DIAGNOSIS — D638 Anemia in other chronic diseases classified elsewhere: Secondary | ICD-10-CM | POA: Diagnosis present

## 2015-03-27 DIAGNOSIS — Z87891 Personal history of nicotine dependence: Secondary | ICD-10-CM

## 2015-03-27 DIAGNOSIS — Z809 Family history of malignant neoplasm, unspecified: Secondary | ICD-10-CM

## 2015-03-27 DIAGNOSIS — J80 Acute respiratory distress syndrome: Secondary | ICD-10-CM

## 2015-03-27 DIAGNOSIS — I10 Essential (primary) hypertension: Secondary | ICD-10-CM | POA: Diagnosis present

## 2015-03-27 DIAGNOSIS — Z21 Asymptomatic human immunodeficiency virus [HIV] infection status: Secondary | ICD-10-CM | POA: Diagnosis not present

## 2015-03-27 DIAGNOSIS — K759 Inflammatory liver disease, unspecified: Secondary | ICD-10-CM | POA: Diagnosis present

## 2015-03-27 DIAGNOSIS — E871 Hypo-osmolality and hyponatremia: Secondary | ICD-10-CM | POA: Diagnosis present

## 2015-03-27 DIAGNOSIS — J9600 Acute respiratory failure, unspecified whether with hypoxia or hypercapnia: Secondary | ICD-10-CM

## 2015-03-27 DIAGNOSIS — D893 Immune reconstitution syndrome: Secondary | ICD-10-CM | POA: Insufficient documentation

## 2015-03-27 DIAGNOSIS — F419 Anxiety disorder, unspecified: Secondary | ICD-10-CM | POA: Diagnosis present

## 2015-03-27 DIAGNOSIS — R351 Nocturia: Secondary | ICD-10-CM | POA: Diagnosis present

## 2015-03-27 DIAGNOSIS — J189 Pneumonia, unspecified organism: Secondary | ICD-10-CM | POA: Diagnosis not present

## 2015-03-27 DIAGNOSIS — J9601 Acute respiratory failure with hypoxia: Secondary | ICD-10-CM | POA: Diagnosis not present

## 2015-03-27 DIAGNOSIS — Z885 Allergy status to narcotic agent status: Secondary | ICD-10-CM | POA: Diagnosis not present

## 2015-03-27 DIAGNOSIS — E785 Hyperlipidemia, unspecified: Secondary | ICD-10-CM | POA: Diagnosis present

## 2015-03-27 DIAGNOSIS — I959 Hypotension, unspecified: Secondary | ICD-10-CM | POA: Diagnosis not present

## 2015-03-27 DIAGNOSIS — Z452 Encounter for adjustment and management of vascular access device: Secondary | ICD-10-CM

## 2015-03-27 DIAGNOSIS — R0602 Shortness of breath: Secondary | ICD-10-CM | POA: Diagnosis present

## 2015-03-27 HISTORY — DX: Type 2 diabetes mellitus without complications: E11.9

## 2015-03-27 HISTORY — DX: Human immunodeficiency virus (HIV) disease: B20

## 2015-03-27 HISTORY — DX: Asymptomatic human immunodeficiency virus (hiv) infection status: Z21

## 2015-03-27 LAB — CBC WITH DIFFERENTIAL/PLATELET
BASOS PCT: 1 % (ref 0–1)
BASOS PCT: 1 % (ref 0–1)
Basophils Absolute: 0.1 10*3/uL (ref 0.0–0.1)
Basophils Absolute: 0.1 10*3/uL (ref 0.0–0.1)
Eosinophils Absolute: 0.2 10*3/uL (ref 0.0–0.7)
Eosinophils Absolute: 0.2 10*3/uL (ref 0.0–0.7)
Eosinophils Relative: 2 % (ref 0–5)
Eosinophils Relative: 2 % (ref 0–5)
HCT: 32.4 % — ABNORMAL LOW (ref 36.0–46.0)
HCT: 35.9 % — ABNORMAL LOW (ref 36.0–46.0)
HEMOGLOBIN: 11.2 g/dL — AB (ref 12.0–15.0)
HEMOGLOBIN: 12.5 g/dL (ref 12.0–15.0)
LYMPHS PCT: 10 % — AB (ref 12–46)
Lymphocytes Relative: 10 % — ABNORMAL LOW (ref 12–46)
Lymphs Abs: 1.2 10*3/uL (ref 0.7–4.0)
Lymphs Abs: 1.2 10*3/uL (ref 0.7–4.0)
MCH: 31.9 pg (ref 26.0–34.0)
MCH: 32.4 pg (ref 26.0–34.0)
MCHC: 34.6 g/dL (ref 30.0–36.0)
MCHC: 34.8 g/dL (ref 30.0–36.0)
MCV: 92.3 fL (ref 78.0–100.0)
MCV: 93 fL (ref 78.0–100.0)
MONOS PCT: 3 % (ref 3–12)
Monocytes Absolute: 0.4 10*3/uL (ref 0.1–1.0)
Monocytes Absolute: 0.4 10*3/uL (ref 0.1–1.0)
Monocytes Relative: 3 % (ref 3–12)
NEUTROS PCT: 84 % — AB (ref 43–77)
NEUTROS PCT: 84 % — AB (ref 43–77)
Neutro Abs: 9.8 10*3/uL — ABNORMAL HIGH (ref 1.7–7.7)
Neutro Abs: 10.5 10*3/uL — ABNORMAL HIGH (ref 1.7–7.7)
PLATELETS: 288 10*3/uL (ref 150–400)
Platelets: 258 10*3/uL (ref 150–400)
RBC: 3.51 MIL/uL — ABNORMAL LOW (ref 3.87–5.11)
RBC: 3.86 MIL/uL — ABNORMAL LOW (ref 3.87–5.11)
RDW: 14.7 % (ref 11.5–15.5)
RDW: 14.9 % (ref 11.5–15.5)
WBC: 11.7 10*3/uL — ABNORMAL HIGH (ref 4.0–10.5)
WBC: 12.4 10*3/uL — ABNORMAL HIGH (ref 4.0–10.5)

## 2015-03-27 LAB — BASIC METABOLIC PANEL
Anion gap: 12 (ref 5–15)
Anion gap: 10 (ref 5–15)
BUN: 11 mg/dL (ref 6–20)
BUN: 11 mg/dL (ref 6–20)
CO2: 21 mmol/L — ABNORMAL LOW (ref 22–32)
CO2: 24 mmol/L (ref 22–32)
CREATININE: 0.72 mg/dL (ref 0.44–1.00)
CREATININE: 0.57 mg/dL (ref 0.44–1.00)
Calcium: 8.6 mg/dL — ABNORMAL LOW (ref 8.9–10.3)
Calcium: 8.1 mg/dL — ABNORMAL LOW (ref 8.9–10.3)
Chloride: 101 mmol/L (ref 101–111)
Chloride: 101 mmol/L (ref 101–111)
GFR calc Af Amer: >60 mL/min (ref >60)
GFR calc Af Amer: >60 mL/min (ref >60)
GLUCOSE: 74 mg/dL (ref 65–99)
Glucose, Bld: 134 mg/dL — ABNORMAL HIGH (ref 65–99)
Potassium: 3.8 mmol/L (ref 3.5–5.1)
Potassium: 3.5 mmol/L (ref 3.5–5.1)
SODIUM: 135 mmol/L (ref 135–145)
Sodium: 134 mmol/L — ABNORMAL LOW (ref 135–145)

## 2015-03-27 LAB — PROTIME-INR
INR: 1.22 (ref 0.00–1.49)
Prothrombin Time: 15.6 seconds — ABNORMAL HIGH (ref 11.6–15.2)

## 2015-03-27 LAB — BLOOD GAS, ARTERIAL
ACID-BASE EXCESS: 1.9 mmol/L (ref 0.0–2.0)
Bicarbonate: 23.9 mEq/L (ref 20.0–24.0)
Drawn by: 422461
O2 Content: 6 L/min
O2 Saturation: 84.6 %
PATIENT TEMPERATURE: 98.2
PH ART: 7.517 — AB (ref 7.350–7.450)
TCO2: 21.1 mmol/L (ref 0–100)
pCO2 arterial: 29.5 mmHg — ABNORMAL LOW (ref 35.0–45.0)
pO2, Arterial: 50.2 mmHg — ABNORMAL LOW (ref 80.0–100.0)

## 2015-03-27 LAB — I-STAT TROPONIN, ED: Troponin i, poc: 0.01 ng/mL (ref 0.00–0.08)

## 2015-03-27 LAB — LACTIC ACID, PLASMA
LACTIC ACID, VENOUS: 2.6 mmol/L — AB (ref 0.5–2.0)
Lactic Acid, Venous: 2.9 mmol/L (ref 0.5–2.0)

## 2015-03-27 LAB — PROCALCITONIN: PROCALCITONIN: 1.83 ng/mL

## 2015-03-27 LAB — MAGNESIUM: MAGNESIUM: 1.7 mg/dL (ref 1.7–2.4)

## 2015-03-27 LAB — HIV-1 GENOTYPR PLUS

## 2015-03-27 LAB — APTT: aPTT: 24 seconds (ref 24–37)

## 2015-03-27 LAB — GLUCOSE, CAPILLARY: Glucose-Capillary: 82 mg/dL (ref 65–99)

## 2015-03-27 LAB — BRAIN NATRIURETIC PEPTIDE: B Natriuretic Peptide: 50.4 pg/mL (ref 0.0–100.0)

## 2015-03-27 LAB — D-DIMER, QUANTITATIVE (NOT AT ARMC): D DIMER QUANT: 3.14 ug{FEU}/mL — AB (ref 0.00–0.48)

## 2015-03-27 LAB — TSH: TSH: 0.978 u[IU]/mL (ref 0.350–4.500)

## 2015-03-27 LAB — MRSA PCR SCREENING: MRSA BY PCR: NEGATIVE

## 2015-03-27 LAB — CBG MONITORING, ED: GLUCOSE-CAPILLARY: 82 mg/dL (ref 65–99)

## 2015-03-27 LAB — PHOSPHORUS: PHOSPHORUS: 3.2 mg/dL (ref 2.5–4.6)

## 2015-03-27 MED ORDER — CEFTRIAXONE SODIUM 250 MG IJ SOLR
250.0000 mg | Freq: Once | INTRAMUSCULAR | Status: AC
  Administered 2015-03-27: 250 mg via INTRAMUSCULAR
  Filled 2015-03-27: qty 250

## 2015-03-27 MED ORDER — DOLUTEGRAVIR SODIUM 50 MG PO TABS
50.0000 mg | ORAL_TABLET | Freq: Every day | ORAL | Status: DC
  Administered 2015-03-27 – 2015-04-02 (×5): 50 mg via ORAL
  Filled 2015-03-27 (×9): qty 1

## 2015-03-27 MED ORDER — LIDOCAINE HCL (PF) 1 % IJ SOLN
INTRAMUSCULAR | Status: AC
  Filled 2015-03-27: qty 5

## 2015-03-27 MED ORDER — SULFAMETHOXAZOLE-TRIMETHOPRIM 800-160 MG PO TABS
1.0000 | ORAL_TABLET | Freq: Once | ORAL | Status: AC
  Administered 2015-03-27: 1 via ORAL
  Filled 2015-03-27: qty 1

## 2015-03-27 MED ORDER — SODIUM CHLORIDE 0.9 % IJ SOLN
3.0000 mL | Freq: Two times a day (BID) | INTRAMUSCULAR | Status: DC
  Administered 2015-03-27 – 2015-04-01 (×10): 3 mL via INTRAVENOUS

## 2015-03-27 MED ORDER — ACETAMINOPHEN 650 MG RE SUPP
650.0000 mg | Freq: Four times a day (QID) | RECTAL | Status: DC | PRN
Start: 2015-03-27 — End: 2015-03-29

## 2015-03-27 MED ORDER — DEXTROSE 5 % IV SOLN
500.0000 mg | INTRAVENOUS | Status: DC
  Administered 2015-03-28 – 2015-04-02 (×6): 500 mg via INTRAVENOUS
  Filled 2015-03-27 (×6): qty 500

## 2015-03-27 MED ORDER — MORPHINE SULFATE 2 MG/ML IJ SOLN
0.5000 mg | Freq: Once | INTRAMUSCULAR | Status: AC
  Administered 2015-03-27: 0.5 mg via INTRAVENOUS
  Filled 2015-03-27: qty 1

## 2015-03-27 MED ORDER — CEFTRIAXONE SODIUM IN DEXTROSE 20 MG/ML IV SOLN
1.0000 g | INTRAVENOUS | Status: DC
  Administered 2015-03-27 – 2015-04-02 (×7): 1 g via INTRAVENOUS
  Filled 2015-03-27 (×8): qty 50

## 2015-03-27 MED ORDER — ENSURE ENLIVE PO LIQD: 237.0000 mL | Freq: Two times a day (BID) | ORAL | Status: DC

## 2015-03-27 MED ORDER — ONDANSETRON HCL 4 MG PO TABS: 4.0000 mg | ORAL_TABLET | Freq: Four times a day (QID) | ORAL | Status: DC | PRN

## 2015-03-27 MED ORDER — ACETAMINOPHEN 325 MG PO TABS
650.0000 mg | ORAL_TABLET | Freq: Four times a day (QID) | ORAL | Status: DC | PRN
  Administered 2015-03-27: 650 mg via ORAL
  Filled 2015-03-27: qty 2

## 2015-03-27 MED ORDER — EMTRICITABINE-TENOFOVIR AF 200-25 MG PO TABS
1.0000 | ORAL_TABLET | Freq: Every day | ORAL | Status: DC
  Administered 2015-03-27: 1 via ORAL

## 2015-03-27 MED ORDER — DIPHENHYDRAMINE HCL 50 MG/ML IJ SOLN
12.5000 mg | Freq: Three times a day (TID) | INTRAMUSCULAR | Status: DC | PRN
  Administered 2015-04-01: 12.5 mg via INTRAVENOUS
  Filled 2015-03-27: qty 1

## 2015-03-27 MED ORDER — IPRATROPIUM-ALBUTEROL 0.5-2.5 (3) MG/3ML IN SOLN
3.0000 mL | RESPIRATORY_TRACT | Status: DC
  Administered 2015-03-27 – 2015-03-29 (×10): 3 mL via RESPIRATORY_TRACT
  Filled 2015-03-27 (×10): qty 3

## 2015-03-27 MED ORDER — CETYLPYRIDINIUM CHLORIDE 0.05 % MT LIQD
7.0000 mL | Freq: Two times a day (BID) | OROMUCOSAL | Status: DC
  Administered 2015-03-27: 7 mL via OROMUCOSAL

## 2015-03-27 MED ORDER — EMTRICITABINE-TENOFOVIR AF 200-25 MG PO TABS
1.0000 | ORAL_TABLET | Freq: Every day | ORAL | Status: DC
  Filled 2015-03-27: qty 1

## 2015-03-27 MED ORDER — METOPROLOL TARTRATE 1 MG/ML IV SOLN
5.0000 mg | Freq: Once | INTRAVENOUS | Status: AC
  Administered 2015-03-27: 5 mg via INTRAVENOUS
  Filled 2015-03-27: qty 5

## 2015-03-27 MED ORDER — SODIUM CHLORIDE 0.9 % IV BOLUS (SEPSIS)
250.0000 mL | Freq: Once | INTRAVENOUS | Status: AC
  Administered 2015-03-27: 250 mL via INTRAVENOUS

## 2015-03-27 MED ORDER — SODIUM CHLORIDE 0.9 % IV BOLUS (SEPSIS): 1000.0000 mL | Freq: Once | INTRAVENOUS | Status: DC

## 2015-03-27 MED ORDER — ONDANSETRON HCL 4 MG/2ML IJ SOLN
4.0000 mg | Freq: Four times a day (QID) | INTRAMUSCULAR | Status: DC | PRN
Start: 2015-03-27 — End: 2015-04-02

## 2015-03-27 MED ORDER — DEXTROSE 5 % IV SOLN
500.0000 mg | Freq: Once | INTRAVENOUS | Status: AC
  Administered 2015-03-27: 500 mg via INTRAVENOUS
  Filled 2015-03-27: qty 500

## 2015-03-27 MED ORDER — IOHEXOL 350 MG/ML SOLN
100.0000 mL | Freq: Once | INTRAVENOUS | Status: AC | PRN
  Administered 2015-03-27: 80 mL via INTRAVENOUS

## 2015-03-27 MED ORDER — CEFTRIAXONE SODIUM IN DEXTROSE 20 MG/ML IV SOLN: 1.0000 g | Freq: Once | INTRAVENOUS | Status: DC

## 2015-03-27 MED ORDER — DEXTROSE 5 % IV SOLN: 500.0000 mg | Freq: Once | INTRAVENOUS | Status: DC

## 2015-03-27 MED ORDER — FUROSEMIDE 10 MG/ML IJ SOLN: 40.0000 mg | Freq: Once | INTRAMUSCULAR | Status: DC

## 2015-03-27 MED ORDER — DOLUTEGRAVIR SODIUM 50 MG PO TABS
50.0000 mg | ORAL_TABLET | Freq: Every day | ORAL | Status: DC
  Filled 2015-03-27: qty 1

## 2015-03-27 MED ORDER — SULFAMETHOXAZOLE-TRIMETHOPRIM 800-160 MG PO TABS: 1.0000 | ORAL_TABLET | Freq: Every day | ORAL | Status: DC

## 2015-03-27 NOTE — Progress Notes (Signed)
Notified Elink Occupational hygienist. Pt seems to be having less WOB, and more comfortable. However, pt is still tachycardic and tachypneic. Pt has been wearing venturi mask receiving 55 % FiO2 at 14 liters since 2100. Also temperature has decreased to 99.6 orally. Will continue to monitor and assess.

## 2015-03-27 NOTE — ED Notes (Signed)
She remains in no distress and is taken to [facility] at this time.

## 2015-03-27 NOTE — ED Notes (Signed)
She states she has had dry mouth and polyuria, esp. At night x ~3 months.  She states she was recently dx with type II diabetes; however, has not received any medications for treatment of same.  Her skin is normal, warm and dry and she hyperventilates when observed. At times when observed surreptitiously her respirations are normal.

## 2015-03-27 NOTE — H&P (Signed)
[facility] History and Physical  Courtney Escobar TIW:[id number] DOB: 11/28/1955 DOA: 03/27/2015  Referring physician: ER physician: Dr. Theone Murdoch PCP: No primary care provider on file. -Follows with ID clinic   Chief Complaint: shortness of breath   HPI:  59 y.o. female with past medical history of HIV (on ART) who presented to [facility] ED with worsening shortness of breath for past few months but worsening over a day or so. Shortness of breath was worse with movement and is present at rest. She has intermittent cough. No chest pain. No palpitations. No fevers. No abdominal pain, nausea or vomiting. No weight loss and no reports of night sweats. No lightheadedness.   In ED, pt was hypoxic with oxygen saturation of 88% but has improved to 96% with nasal canula oxygen support. HR was 95, RR 22 and afebrile. Blood work showed sodium of 134, WBC count pf 11.7, hemoglobin 11.2, D dimer was 3.14, BNP was WNL, lactic acid 2.6. CXR showed multifocal patchy opacities bilaterally suspicious for multifocal pneumonia. CT angio chest ruled out pulmonary embolism but showed extensive airspace opacities throughout both lungs involving bases worse than apices, left greater than right. She was started on azithromycin, rocephin and bactrim for possible PCP. She was initially on telemetry unit but due to worse breathing she was transferred to SDU.   Assessment & Plan    Principal Problem: Acute respiratory failure with hypoxia / multifocal pneumonia / possible PCP - Hypoxia likely from multifocal pneumonia (community and possible PCP) - Pneumonia order set placed; follow up blood culture results, HIV, influenza, strep pneumonia, legionella and resp culture results - Started on azithromycin and rocephin - Called e-link to make aware of patient's clinical status - Started duoneb every 4 hours scheduled adn as needed for shortness of breath or wheezing - Continue oxygen support via [place] to keep O2 sats above  90%  Active Problems: Sepsis due to multilobar pneumonia, unspecified organism / Leukocytosis - Sepsis criteria met on admission with tachycardia, tachypnea, hypoxia, lactic acidosis,elevated procalcitonin level. Source of infection - multifocal pneumonia seen on CXR and CT angio chest  - Sepsis work up initiated - Follow up blood culture results - Continue azithromycin and rocephin  HIV / AIDS - On ART - Last CD4 count 03/16/2015 less than 10 - Started bactrim for PCP prophylaxis - Azithromycin will cover for MAC - Consult ID in am - May need fluconazole and/or acyclovir but will follow up on ID recommendations    DVT prophylaxis:  - SCD's bilaterally   Radiological Exams on Admission: Dg Chest 2 View 03/27/2015  Multifocal patchy opacities bilaterally, suspicious for multifocal pneumonia.   Electronically Signed   By: Julian Hy M.D.   On: 03/27/2015 11:49   Ct Angio Chest Pe W/cm &/or Wo Cm 03/27/2015   1. Extensive airspace opacities throughout both lungs involving bases worse than apices, left greater than right. Asymmetric edema, pneumonia, and a variety of inflammatory processes can give this appearance. 2. Bilateral hilar and mediastinal adenopathy, possibly reactive but nonspecific. 3. Negative for acute PE or thoracic aortic dissection.   Electronically Signed   By: Lucrezia Europe M.D.   On: 03/27/2015 12:47    EKG: I have personally reviewed EKG. EKG shows sinus tachycardia   Code Status: Full Family Communication: Plan of care discussed with the patient  Disposition Plan: Admit for further evaluation; telemetry --> SDU  Leisa Lenz, MD  [facility] Pager [phone]  Time spent in minutes: 75 minutes  Review  of Systems:  Constitutional: Negative for fever, chills and malaise/fatigue. Negative for diaphoresis.  HENT: Negative for hearing loss, ear pain, nosebleeds, congestion, sore throat, neck pain, tinnitus and ear discharge.   Eyes: Negative for blurred  vision, double vision, photophobia, pain, discharge and redness.  Respiratory: per HPI.   Cardiovascular: Negative for chest pain, palpitations, orthopnea, claudication and leg swelling.  Gastrointestinal: Negative for nausea, vomiting and abdominal pain. Negative for heartburn, constipation, blood in stool and melena.  Genitourinary: Negative for dysuria, urgency, frequency, hematuria and flank pain.  Musculoskeletal: Negative for myalgias, back pain, joint pain and falls.  Skin: Negative for itching and rash.  Neurological: Negative for dizziness and weakness. Negative for tingling, tremors, sensory change, speech change, focal weakness, loss of consciousness and headaches.  Endo/Heme/Allergies: Negative for environmental allergies and polydipsia. Does not bruise/bleed easily.  Psychiatric/Behavioral: Negative for suicidal ideas. The patient is not nervous/anxious.      Past Medical History  Diagnosis Date  . Arthritis   . Anxiety   . GERD (gastroesophageal reflux disease)     due to anxiety  . Hyperlipidemia   . Acquired immune deficiency syndrome   . Diabetes mellitus without complication   . HIV (human immunodeficiency virus infection)    Past Surgical History  Procedure Laterality Date  . Tonsillectomy    . Foot surgery    . Cholecystectomy     Social History:  reports that she quit smoking about 18 years ago. Her smoking use included Cigarettes. She has never used smokeless tobacco. She reports that she does not drink alcohol or use illicit drugs.  Allergies  Allergen Reactions  . Codeine     REACTION: itching    Family History:  Family History  Problem Relation Age of Onset  . Hypertension Sister   . Arthritis Brother     ?rheumatoid arthritis, dermal changes  . Cancer Brother   . Tuberculosis Father      Prior to Admission medications   Medication Sig Start Date End Date Taking? Authorizing Provider  dolutegravir (TIVICAY) 50 MG tablet Take 1 tablet (50 mg  total) by mouth daily. 03/16/15  Yes Campbell Riches, MD  emtricitabine-tenofovir AF (DESCOVY) 200-25 MG per tablet Take 1 tablet by mouth daily. 03/16/15  Yes Campbell Riches, MD  fluocinonide ointment (LIDEX) 2.35 % Apply 1 application topically daily.  03/16/15  Yes Historical Provider, MD  sulfamethoxazole-trimethoprim (BACTRIM) 400-80 MG per tablet Take 1 tablet by mouth 3 (three) times a week. 03/16/15  Yes Campbell Riches, MD  azithromycin (ZITHROMAX) 600 MG tablet Take 2 tablets (1,200 mg total) by mouth once a week. 03/16/15   Campbell Riches, MD   Physical Exam: Filed Vitals:   03/27/15 1343 03/27/15 1523 03/27/15 1604 03/27/15 1618  BP:  144/81 123/75   Pulse:  132 128   Temp:  99.1 F (37.3 C) 99.1 F (37.3 C)   TempSrc:  Oral Oral   Resp:  32 32   Height:    '5\' 7"'  (1.702 m)  Weight:    63.9 kg (140 lb 14 oz)  SpO2: 94% 92% 98%     Physical Exam  Constitutional: Appears well-developed and well-nourished. No distress.  HENT: Normocephalic. No tonsillar erythema or exudates Eyes: Conjunctivae and EOM are normal. PERRLA, no scleral icterus.  Neck: Normal ROM. Neck supple. No JVD. No tracheal deviation. No thyromegaly.  CVS: RRR, S1/S2 +, no murmurs, no gallops, no carotid bruit.  Pulmonary: Diminished breath sounds, no wheezing .  Abdominal: Soft. BS +,  no distension, tenderness, rebound or guarding.  Musculoskeletal: Normal range of motion. No edema and no tenderness.  Lymphadenopathy: No lymphadenopathy noted, cervical, inguinal. Neuro: Alert. Normal reflexes, muscle tone coordination. No focal neurologic deficits. Skin: Skin is warm and dry. No rash noted.  No erythema. No pallor.  Psychiatric: Normal mood and affect. Behavior, judgment, thought content normal.   Labs on Admission:  Basic Metabolic Panel:  Recent Labs Lab 03/27/15 1056  NA 134*  K 3.8  CL 101  CO2 21*  GLUCOSE 134*  BUN 11  CREATININE 0.72  CALCIUM 8.6*   Liver Function Tests: No  results for input(s): AST, ALT, ALKPHOS, BILITOT, PROT, ALBUMIN in the last 168 hours. No results for input(s): LIPASE, AMYLASE in the last 168 hours. No results for input(s): AMMONIA in the last 168 hours. CBC:  Recent Labs Lab 03/27/15 1327  WBC 11.7*  NEUTROABS 9.8*  HGB 11.2*  HCT 32.4*  MCV 92.3  PLT 258   Cardiac Enzymes: No results for input(s): CKTOTAL, CKMB, CKMBINDEX, TROPONINI in the last 168 hours. BNP: Invalid input(s): POCBNP CBG:  Recent Labs Lab 03/27/15 1030  GLUCAP 82    If 7PM-7AM, please contact night-coverage [email or web address] Password [facility] 03/27/2015, 4:46 PM

## 2015-03-27 NOTE — Progress Notes (Signed)
CRITICAL VALUE ALERT  Critical value received: Lactic acid 2.9  Date of notification:  03/27/15   Time of notification:  2055  Critical value read back: Yes   Nurse who received alert: Polly Cobia RN   MD notified (1st page):  MD Mungal   Time of first page: 2059  MD notified (2nd page):  Time of second page:  Responding MD: Dreama Saa RN   Time MD responded: 2100

## 2015-03-27 NOTE — Progress Notes (Signed)
eLink Physician-Brief Progress Note Patient Name: Courtney Escobar DOB: 1956/05/27 MRN: [id number]   Date of Service  03/27/2015  HPI/Events of Note  59 yo female with PMHx of HIV (on HAART), HLD, GERD, presenting with dyspnea found to have respiratory distress and multifocal pneumonia (on CT Chest)  eICU Interventions  Resp Distress - sats >88%, may need to use ventimask or NRB - 0.5 morphine IV x 1, prn benadryl (itching with codeine) - monitor resp status - cont with current antibiotics - 250 NS bolus given HR and insensible water loss.   HTN  - no hx of HTN - secondary to inflammatory state\infection - metoprolol 5mg  IV x 1, may assist with respiratory status also.      Intervention Category Evaluation Type: New Patient Evaluation  Gurfateh Mcclain 03/27/2015, 8:19 PM

## 2015-03-27 NOTE — Progress Notes (Signed)
Received pt to room from 1427. Pt noted anxious and tachypneic. ECG noted SVT in the 140's. Alerted CCM, but no consult at this time. Notified Dr Charlies Silvers also. Pt denied any pain, or SOB. Alert and oriented x4.

## 2015-03-27 NOTE — ED Provider Notes (Signed)
CSN: [id number]     Arrival date & time 03/27/15  1009 History   First MD Initiated Contact with Patient 03/27/15 1022     Chief Complaint  Patient presents with  . Hyperventilating     (Consider location/radiation/quality/duration/timing/severity/associated sxs/prior Treatment) Patient is a 59 y.o. female presenting with shortness of breath.  Shortness of Breath Severity:  Moderate Onset quality:  Gradual Duration:  3 months Timing:  Constant Progression:  Unchanged Chronicity:  New Context comment:  Recent HIV dx, states she is anxious Relieved by:  Nothing Worsened by:  Nothing tried Ineffective treatments:  None tried Associated symptoms: no abdominal pain, no chest pain, no cough, no fever and no vomiting     Past Medical History  Diagnosis Date  . Arthritis   . Anxiety   . GERD (gastroesophageal reflux disease)     due to anxiety  . Hyperlipidemia   . Acquired immune deficiency syndrome   . Diabetes mellitus without complication   . HIV (human immunodeficiency virus infection)    Past Surgical History  Procedure Laterality Date  . Tonsillectomy    . Foot surgery    . Cholecystectomy     Family History  Problem Relation Age of Onset  . Hypertension Sister   . Arthritis Brother     ?rheumatoid arthritis, dermal changes  . Cancer Brother   . Tuberculosis Father    History  Substance Use Topics  . Smoking status: Former Smoker    Types: Cigarettes    Quit date: 05/22/1996  . Smokeless tobacco: Never Used  . Alcohol Use: No     Comment: none since 10/19/14   OB History    Gravida Para Term Preterm AB TAB SAB Ectopic Multiple Living   2 1 1  0 1 0 0 0 0 1     Review of Systems  Constitutional: Negative for fever.  Respiratory: Positive for shortness of breath. Negative for cough.   Cardiovascular: Negative for chest pain.  Gastrointestinal: Negative for vomiting and abdominal pain.  All other systems reviewed and are negative.     Allergies   Codeine  Home Medications   Prior to Admission medications   Medication Sig Start Date End Date Taking? Authorizing Provider  dolutegravir (TIVICAY) 50 MG tablet Take 1 tablet (50 mg total) by mouth daily. 03/16/15  Yes Campbell Riches, MD  emtricitabine-tenofovir AF (DESCOVY) 200-25 MG per tablet Take 1 tablet by mouth daily. 03/16/15  Yes Campbell Riches, MD  fluocinonide ointment (LIDEX) 3.71 % Apply 1 application topically daily.  03/16/15  Yes Historical Provider, MD  sulfamethoxazole-trimethoprim (BACTRIM) 400-80 MG per tablet Take 1 tablet by mouth 3 (three) times a week. 03/16/15  Yes Campbell Riches, MD  azithromycin (ZITHROMAX) 600 MG tablet Take 2 tablets (1,200 mg total) by mouth once a week. 03/16/15   Campbell Riches, MD   BP 89/66 mmHg  Pulse 109  Temp(Src) 99.1 F (37.3 C) (Core (Comment))  Resp 29  Ht 5\' 7"  (1.702 m)  Wt 140 lb 14 oz (63.9 kg)  BMI 22.06 kg/m2  SpO2 94% Physical Exam  Constitutional: She is oriented to person, place, and time. She appears well-developed and well-nourished.  HENT:  Head: Normocephalic and atraumatic.  Right Ear: External ear normal.  Left Ear: External ear normal.  Eyes: Conjunctivae and EOM are normal. Pupils are equal, round, and reactive to light.  Neck: Normal range of motion. Neck supple.  Cardiovascular: Regular rhythm, normal heart sounds and intact  distal pulses.  Tachycardia present.   HR 155 on my exam  Pulmonary/Chest: Effort normal and breath sounds normal. Tachypnea noted.  Abdominal: Soft. Bowel sounds are normal. There is no tenderness.  Musculoskeletal: Normal range of motion.  Neurological: She is alert and oriented to person, place, and time.  Skin: Skin is warm and dry.  Vitals reviewed.   ED Course  Procedures (including critical care time) Labs Review Labs Reviewed  BASIC METABOLIC PANEL - Abnormal; Notable for the following:    Sodium 134 (*)    CO2 21 (*)    Glucose, Bld 134 (*)    Calcium 8.6  (*)    All other components within normal limits  D-DIMER, QUANTITATIVE (NOT AT [facility]) - Abnormal; Notable for the following:    D-Dimer, Quant 3.14 (*)    All other components within normal limits  CBC WITH DIFFERENTIAL/PLATELET - Abnormal; Notable for the following:    WBC 11.7 (*)    RBC 3.51 (*)    Hemoglobin 11.2 (*)    HCT 32.4 (*)    Neutrophils Relative % 84 (*)    Lymphocytes Relative 10 (*)    Neutro Abs 9.8 (*)    All other components within normal limits  CBC WITH DIFFERENTIAL/PLATELET - Abnormal; Notable for the following:    WBC 12.4 (*)    RBC 3.86 (*)    HCT 35.9 (*)    Neutrophils Relative % 84 (*)    Lymphocytes Relative 10 (*)    Neutro Abs 10.5 (*)    All other components within normal limits  LACTIC ACID, PLASMA - Abnormal; Notable for the following:    Lactic Acid, Venous 2.6 (*)    All other components within normal limits  LACTIC ACID, PLASMA - Abnormal; Notable for the following:    Lactic Acid, Venous 2.9 (*)    All other components within normal limits  PROTIME-INR - Abnormal; Notable for the following:    Prothrombin Time 15.6 (*)    All other components within normal limits  BASIC METABOLIC PANEL - Abnormal; Notable for the following:    Calcium 8.1 (*)    All other components within normal limits  COMPREHENSIVE METABOLIC PANEL - Abnormal; Notable for the following:    Calcium 7.8 (*)    Total Protein 5.9 (*)    Albumin 1.6 (*)    AST 148 (*)    ALT 65 (*)    Alkaline Phosphatase 337 (*)    Total Bilirubin 2.1 (*)    All other components within normal limits  BLOOD GAS, ARTERIAL - Abnormal; Notable for the following:    pH, Arterial 7.517 (*)    pCO2 arterial 29.5 (*)    pO2, Arterial 50.2 (*)    All other components within normal limits  BLOOD GAS, ARTERIAL - Abnormal; Notable for the following:    pH, Arterial 7.347 (*)    Acid-base deficit 2.6 (*)    All other components within normal limits  BLOOD GAS, ARTERIAL - Abnormal; Notable  for the following:    pH, Arterial 7.205 (*)    pCO2 arterial 59.6 (*)    pO2, Arterial 109 (*)    Acid-base deficit 6.1 (*)    All other components within normal limits  URINALYSIS, ROUTINE W REFLEX MICROSCOPIC (NOT AT [facility]) - Abnormal; Notable for the following:    Color, Urine AMBER (*)    APPearance CLOUDY (*)    Bilirubin Urine SMALL (*)    Protein, ur  30 (*)    All other components within normal limits  BLOOD GAS, ARTERIAL - Abnormal; Notable for the following:    pH, Arterial 7.255 (*)    pCO2 arterial 49.0 (*)    pO2, Arterial 105 (*)    Acid-base deficit 5.7 (*)    All other components within normal limits  URINE MICROSCOPIC-ADD ON - Abnormal; Notable for the following:    Casts HYALINE CASTS (*)    All other components within normal limits  GLUCOSE, CAPILLARY - Abnormal; Notable for the following:    Glucose-Capillary 123 (*)    All other components within normal limits  CBC - Abnormal; Notable for the following:    WBC 13.6 (*)    RBC 3.18 (*)    Hemoglobin 10.2 (*)    HCT 30.2 (*)    All other components within normal limits  MRSA PCR SCREENING  CULTURE, BLOOD (ROUTINE X 2)  CULTURE, BLOOD (ROUTINE X 2)  CULTURE, EXPECTORATED SPUTUM-ASSESSMENT  GRAM STAIN  PNEUMOCYSTIS JIROVECI SMEAR BY DFA  CULTURE, RESPIRATORY (NON-EXPECTORATED)  BRAIN NATRIURETIC PEPTIDE  PROCALCITONIN  GLUCOSE, CAPILLARY  APTT  TSH  PHOSPHORUS  MAGNESIUM  CARBOXYHEMOGLOBIN  HIV ANTIBODY (ROUTINE TESTING)  LEGIONELLA ANTIGEN, URINE  STREP PNEUMONIAE URINARY ANTIGEN  INFLUENZA PANEL BY PCR (TYPE A & B, H1N1)(NOT AT [facility])  T-HELPER CELLS (CD4) COUNT  CBC WITH DIFFERENTIAL/PLATELET  CBG MONITORING, ED  Randolm Idol, ED    Imaging Review Dg Chest 2 View  03/27/2015   CLINICAL DATA:  Cough, upper back pain, HIV  EXAM: CHEST  2 VIEW  COMPARISON:  03/09/2015  FINDINGS: Multifocal patchy opacities bilaterally with a mid/lower lung predominance, worrisome for multifocal pneumonia.  No  pleural effusion or pneumothorax.  The heart is normal in size.  Visualized osseous structures are within normal limits.  IMPRESSION: Multifocal patchy opacities bilaterally, suspicious for multifocal pneumonia.   Electronically Signed   By: Julian Hy M.D.   On: 03/27/2015 11:49   Ct Angio Chest Pe W/cm &/or Wo Cm  03/27/2015   CLINICAL DATA:  polyuria, esp. At night x ~3 months. She states she was recently dx with type II diabetes; however, has not received any medications for treatment of same. Her skin is normal, warm and dry and she hyperventilates when observed. At times when observed surreptitiously her respirations are normal. Sob with tachycardic progressive over a week  EXAM: CT ANGIOGRAPHY CHEST WITH CONTRAST  TECHNIQUE: Multidetector CT imaging of the chest was performed using the standard protocol during bolus administration of intravenous contrast. Multiplanar CT image reconstructions and MIPs were obtained to evaluate the vascular anatomy.  CONTRAST:  53mL OMNIPAQUE IOHEXOL 350 MG/ML SOLN  COMPARISON:  None.  FINDINGS: Left arm contrast injection. The SVC is patent. Right ventricle nondilated. Satisfactory opacification of pulmonary arteries noted, and there is no evidence of pulmonary emboli. Patent pulmonary veins bilaterally. Adequate contrast opacification of the thoracic aorta with no evidence of dissection, aneurysm, or stenosis. There is bovine variant brachiocephalic arch anatomy without proximal stenosis.  Trace left pleural effusion. No pericardial effusion. Bilateral hilar, subcarinal, and precarinal adenopathy. Emphysematous changes most marked in the apices. Extensive airspace consolidation throughout both lower lobes left worse than right, with a patchy airspace in extensive alveolar opacities throughout the lingula and right middle lobe. There peripheral airspace and alveolar opacities in both upper lobes. Innumerable subcentimeter cystic lucencies scattered throughout both  lungs primarily peripherally and in the bases. Lumbar spine and sternum intact. Visualized portions of upper  abdomen unremarkable.  Review of the MIP images confirms the above findings.  IMPRESSION: 1. Extensive airspace opacities throughout both lungs involving bases worse than apices, left greater than right. Asymmetric edema, pneumonia, and a variety of inflammatory processes can give this appearance. 2. Bilateral hilar and mediastinal adenopathy, possibly reactive but nonspecific. 3. Negative for acute PE or thoracic aortic dissection.   Electronically Signed   By: Lucrezia Europe M.D.   On: 03/27/2015 12:47   Dg Chest Port 1 View  03/28/2015   CLINICAL DATA:  Acute respiratory failure with hypoxia. Severe sepsis was septic shock. HIV. On ventilator.  EXAM: PORTABLE CHEST - 1 VIEW  COMPARISON:  03/28/2015  FINDINGS: Support lines and tubes in appropriate position. Diffuse symmetric bilateral airspace disease shows no significant interval change. No evidence of pneumothorax or definite pleural effusion. Heart size remains within normal limits.  IMPRESSION: Diffuse bilateral airspace disease, without significant change.   Electronically Signed   By: Earle Gell M.D.   On: 03/28/2015 07:36   Dg Chest Port 1 View  03/28/2015   CLINICAL DATA:  Encounter for central line placement  EXAM: PORTABLE CHEST - 1 VIEW  COMPARISON:  Chest x-ray from yesterday  FINDINGS: New left IJ central line, tip at the upper SVC. No pneumothorax. New endotracheal tube with tip just below the clavicular heads. A orogastric tube reaches the stomach at least.  Unchanged widespread airspace disease. Normal heart size and stable mediastinal contours. No evidence of effusion.  IMPRESSION: 1. New tubes and central line are in good position. No pneumothorax. 2. Unchanged widespread airspace disease.   Electronically Signed   By: Monte Fantasia M.D.   On: 03/28/2015 02:18     EKG Interpretation   Date/Time:  Saturday March 27 2015 11:32:09  EDT Ventricular Rate:  138 PR Interval:  158 QRS Duration: 89 QT Interval:  272 QTC Calculation: 412 R Axis:   18 Text Interpretation:  Sinus tachycardia Abnormal R-wave progression, early  transition No old tracing to compare Confirmed by Debby Freiberg [phone])  on 03/27/2015 2:23:49 PM       CRITICAL CARE Performed by: Debby Freiberg   Total critical care time: 35  Critical care time was exclusive of separately billable procedures and treating other patients.  Critical care was necessary to treat or prevent imminent or life-threatening deterioration.  Critical care was time spent personally by me on the following activities: development of treatment plan with patient and/or surrogate as well as nursing, discussions with consultants, evaluation of patient's response to treatment, examination of patient, obtaining history from patient or surrogate, ordering and performing treatments and interventions, ordering and review of laboratory studies, ordering and review of radiographic studies, pulse oximetry and re-evaluation of patient's condition.  MDM   Final diagnoses:  Respiratory failure, acute  Acute respiratory failure  Encounter for central line placement    59 y.o. female with pertinent PMH of HIV, DM presents with primary stated complaint of nocturia, increased frequency, malaise, and dyspnea.  Physical exam on arrival as above.  No fevers or cough, however pt is tachypneic and tachycardic on arrival.    Wu as above with bil PNA, last CD4 count undetectable.  Concern for PJP.  Admitted after bactrim, rocephin/azithro  I have reviewed all laboratory and imaging studies if ordered as above  1. Respiratory failure, acute   2. Acute respiratory failure   3. Encounter for central line placement         Debby Freiberg, MD 03/28/15  0916 

## 2015-03-27 NOTE — Progress Notes (Signed)
Notified Elink RN Gretchin in regards to patient respiratory status. Pt respiratory rate ranges from 35-upper 50s. Pt is tachycardic as well. Temperature 102.8 orally. Blood gas obtained. MD Mungal ordered to give metoprolol 5 mg, 250 cc bolus of normal saline, and 0.5 mg of morphine IV. Tylenol was also given for temp. Will continue to monitor and assess.

## 2015-03-27 NOTE — Progress Notes (Signed)
Report called to stepdown RN. Stacey Drain

## 2015-03-27 NOTE — ED Notes (Signed)
Spoke with Manuela Schwartz re time upstairs at [phone]

## 2015-03-27 NOTE — ED Notes (Signed)
She remains in no distress; and is tachypneic at times.  Her skin remains normal, warm and dry and she denies pain or discomfort--"just don't feel good".

## 2015-03-27 NOTE — Progress Notes (Addendum)
ANTIBIOTIC CONSULT NOTE - INITIAL  Pharmacy Consult for Ceftriaxone and Azithromycin Indication: CAP  Allergies  Allergen Reactions  . Codeine     REACTION: itching    Patient Measurements: Height: 5\' 7"  (170.2 cm) Weight: 140 lb 14 oz (63.9 kg) IBW/kg (Calculated) : 61.6  Vital Signs: Temp: 99.1 F (37.3 C) (07/09 1604) Temp Source: Oral (07/09 1604) BP: 123/75 mmHg (07/09 1604) Pulse Rate: 128 (07/09 1604) Intake/Output from previous day:   Intake/Output from this shift:    Labs:  Recent Labs  03/27/15 1056 03/27/15 1327  WBC  --  11.7*  HGB  --  11.2*  PLT  --  258  CREATININE 0.72  --    Estimated Creatinine Clearance: 75.4 mL/min (by C-G formula based on Cr of 0.72). No results for input(s): VANCOTROUGH, VANCOPEAK, VANCORANDOM, GENTTROUGH, GENTPEAK, GENTRANDOM, TOBRATROUGH, TOBRAPEAK, TOBRARND, AMIKACINPEAK, AMIKACINTROU, AMIKACIN in the last 72 hours.   Microbiology: No results found for this or any previous visit (from the past 720 hour(s)).  Medical History: Past Medical History  Diagnosis Date  . Arthritis   . Anxiety   . GERD (gastroesophageal reflux disease)     due to anxiety  . Hyperlipidemia   . Acquired immune deficiency syndrome   . Diabetes mellitus without complication   . HIV (human immunodeficiency virus infection)     Assessment: 84 y/oF with PMH of HIV, arthritis, anxiety, HLD, recently diagnosed type II diabetes who presents with dry mouth and polyuria x 3 months, dyspnea, and cough. CXR shows multifocalpatchy opacities bilaterally, suspicious for multifocal pneumonia. CTa of chest with extensive airspace opacities throughout both lungs involving bases worse than apices, left greater than right. CTa of chest negative for PE. Pharmacy consulted to dose Ceftriaxone and Azithromycin for CAP.   7/9 blood x 2: sent 7/9 Strep pneumo urinary antigen: ordered 7/9 Legionella urinary antigen: ordered  Goal of Therapy:  Appropriate  antibiotic dosing for indication Eradication of infection  Plan:   Azithromycin 500mg  IV q24h  Ceftriaxone 1g IV q24h  No dose adjustments indicated, so pharmacy will sign off and follow culture results, clinical course peripherally.   Lindell Spar, PharmD, BCPS Pager: [phone] 03/27/2015 5:48 PM

## 2015-03-28 ENCOUNTER — Inpatient Hospital Stay (HOSPITAL_COMMUNITY): Payer: 59

## 2015-03-28 DIAGNOSIS — R6521 Severe sepsis with septic shock: Secondary | ICD-10-CM

## 2015-03-28 DIAGNOSIS — J9601 Acute respiratory failure with hypoxia: Secondary | ICD-10-CM | POA: Diagnosis present

## 2015-03-28 LAB — COMPREHENSIVE METABOLIC PANEL
ALK PHOS: 337 U/L — AB (ref 38–126)
ALT: 65 U/L — AB (ref 14–54)
AST: 148 U/L — AB (ref 15–41)
Albumin: 1.6 g/dL — ABNORMAL LOW (ref 3.5–5.0)
Anion gap: 6 (ref 5–15)
BILIRUBIN TOTAL: 2.1 mg/dL — AB (ref 0.3–1.2)
BUN: 14 mg/dL (ref 6–20)
CHLORIDE: 106 mmol/L (ref 101–111)
CO2: 24 mmol/L (ref 22–32)
Calcium: 7.8 mg/dL — ABNORMAL LOW (ref 8.9–10.3)
Creatinine, Ser: 0.56 mg/dL (ref 0.44–1.00)
GFR calc Af Amer: >60 mL/min (ref >60)
GFR calc non Af Amer: >60 mL/min (ref >60)
Glucose, Bld: 97 mg/dL (ref 65–99)
Potassium: 3.7 mmol/L (ref 3.5–5.1)
SODIUM: 136 mmol/L (ref 135–145)
Total Protein: 5.9 g/dL — ABNORMAL LOW (ref 6.5–8.1)

## 2015-03-28 LAB — CBC WITH DIFFERENTIAL/PLATELET
BASOS PCT: 1 % (ref 0–1)
Basophils Absolute: 0.1 10*3/uL (ref 0.0–0.1)
EOS ABS: 0 10*3/uL (ref 0.0–0.7)
Eosinophils Relative: 0 % (ref 0–5)
HEMATOCRIT: 29.1 % — AB (ref 36.0–46.0)
Hemoglobin: 9.7 g/dL — ABNORMAL LOW (ref 12.0–15.0)
Lymphocytes Relative: 10 % — ABNORMAL LOW (ref 12–46)
Lymphs Abs: 1.1 10*3/uL (ref 0.7–4.0)
MCH: 31.6 pg (ref 26.0–34.0)
MCHC: 33.3 g/dL (ref 30.0–36.0)
MCV: 94.8 fL (ref 78.0–100.0)
MONOS PCT: 1 % — AB (ref 3–12)
Monocytes Absolute: 0.1 10*3/uL (ref 0.1–1.0)
NEUTROS PCT: 88 % — AB (ref 43–77)
Neutro Abs: 9.3 10*3/uL — ABNORMAL HIGH (ref 1.7–7.7)
Platelets: 221 10*3/uL (ref 150–400)
RBC: 3.07 MIL/uL — ABNORMAL LOW (ref 3.87–5.11)
RDW: 15.3 % (ref 11.5–15.5)
WBC: 10.6 10*3/uL — AB (ref 4.0–10.5)

## 2015-03-28 LAB — BLOOD GAS, ARTERIAL
ACID-BASE DEFICIT: 2.6 mmol/L — AB (ref 0.0–2.0)
Acid-base deficit: 6.1 mmol/L — ABNORMAL HIGH (ref 0.0–2.0)
Acid-base deficit: 5.7 mmol/L — ABNORMAL HIGH (ref 0.0–2.0)
BICARBONATE: 22.4 meq/L (ref 20.0–24.0)
BICARBONATE: 20.9 meq/L (ref 20.0–24.0)
Bicarbonate: 22.4 mEq/L (ref 20.0–24.0)
Drawn by: 235321
Drawn by: 308601
Drawn by: 422461
FIO2: 1 %
FIO2: 0.8 %
FIO2: 0.7 %
MECHVT: 370 mL
MECHVT: 370 mL
O2 SAT: 95.3 %
O2 SAT: 95.6 %
O2 Saturation: 94.6 %
PATIENT TEMPERATURE: 37.8
PATIENT TEMPERATURE: 99.3
PEEP/CPAP: 10 cmH2O
PEEP: 5 cmH2O
PEEP: 10 cmH2O
Patient temperature: 98.8
RATE: 20 resp/min
RATE: 28 resp/min
RATE: 35 resp/min
TCO2: 20.6 mmol/L (ref 0–100)
TCO2: 20.3 mmol/L (ref 0–100)
TCO2: 19.7 mmol/L (ref 0–100)
VT: 490 mL
pCO2 arterial: 42 mmHg (ref 35.0–45.0)
pCO2 arterial: 59.6 mmHg (ref 35.0–45.0)
pCO2 arterial: 49 mmHg — ABNORMAL HIGH (ref 35.0–45.0)
pH, Arterial: 7.347 — ABNORMAL LOW (ref 7.350–7.450)
pH, Arterial: 7.205 — ABNORMAL LOW (ref 7.350–7.450)
pH, Arterial: 7.255 — ABNORMAL LOW (ref 7.350–7.450)
pO2, Arterial: 94.6 mmHg (ref 80.0–100.0)
pO2, Arterial: 109 mmHg — ABNORMAL HIGH (ref 80.0–100.0)
pO2, Arterial: 105 mmHg — ABNORMAL HIGH (ref 80.0–100.0)

## 2015-03-28 LAB — GLUCOSE, CAPILLARY
Glucose-Capillary: 123 mg/dL — ABNORMAL HIGH (ref 65–99)
Glucose-Capillary: 163 mg/dL — ABNORMAL HIGH (ref 65–99)

## 2015-03-28 LAB — CBC
HCT: 30.2 % — ABNORMAL LOW (ref 36.0–46.0)
HEMOGLOBIN: 10.2 g/dL — AB (ref 12.0–15.0)
MCH: 32.1 pg (ref 26.0–34.0)
MCHC: 33.8 g/dL (ref 30.0–36.0)
MCV: 95 fL (ref 78.0–100.0)
PLATELETS: 223 10*3/uL (ref 150–400)
RBC: 3.18 MIL/uL — ABNORMAL LOW (ref 3.87–5.11)
RDW: 15.3 % (ref 11.5–15.5)
WBC: 13.6 10*3/uL — ABNORMAL HIGH (ref 4.0–10.5)

## 2015-03-28 LAB — MAGNESIUM: MAGNESIUM: 1.8 mg/dL (ref 1.7–2.4)

## 2015-03-28 LAB — BASIC METABOLIC PANEL
ANION GAP: 6 (ref 5–15)
BUN: 13 mg/dL (ref 6–20)
CALCIUM: 7.6 mg/dL — AB (ref 8.9–10.3)
CO2: 22 mmol/L (ref 22–32)
Chloride: 105 mmol/L (ref 101–111)
Creatinine, Ser: 0.52 mg/dL (ref 0.44–1.00)
GFR calc Af Amer: >60 mL/min (ref >60)
Glucose, Bld: 189 mg/dL — ABNORMAL HIGH (ref 65–99)
POTASSIUM: 4 mmol/L (ref 3.5–5.1)
SODIUM: 133 mmol/L — AB (ref 135–145)

## 2015-03-28 LAB — URINALYSIS, ROUTINE W REFLEX MICROSCOPIC
Glucose, UA: NEGATIVE mg/dL
Hgb urine dipstick: NEGATIVE
Ketones, ur: NEGATIVE mg/dL
Leukocytes, UA: NEGATIVE
Nitrite: NEGATIVE
PH: 6 (ref 5.0–8.0)
Protein, ur: 30 mg/dL — AB
Specific Gravity, Urine: 1.02 (ref 1.005–1.030)
UROBILINOGEN UA: 1 mg/dL (ref 0.0–1.0)

## 2015-03-28 LAB — CARBOXYHEMOGLOBIN
CARBOXYHEMOGLOBIN: 0.9 % (ref 0.5–1.5)
METHEMOGLOBIN: 0.8 % (ref 0.0–1.5)
O2 Saturation: 87.2 %
TOTAL HEMOGLOBIN: 13.3 g/dL (ref 12.0–16.0)

## 2015-03-28 LAB — LACTIC ACID, PLASMA: LACTIC ACID, VENOUS: 3.5 mmol/L — AB (ref 0.5–2.0)

## 2015-03-28 LAB — INFLUENZA PANEL BY PCR (TYPE A & B)
H1N1 flu by pcr: NOT DETECTED
INFLBPCR: NEGATIVE
Influenza A By PCR: NEGATIVE

## 2015-03-28 LAB — URINE MICROSCOPIC-ADD ON

## 2015-03-28 LAB — STREP PNEUMONIAE URINARY ANTIGEN: STREP PNEUMO URINARY ANTIGEN: NEGATIVE

## 2015-03-28 MED ORDER — CHLORHEXIDINE GLUCONATE 0.12 % MT SOLN
15.0000 mL | Freq: Two times a day (BID) | OROMUCOSAL | Status: DC
  Administered 2015-03-28 – 2015-04-02 (×8): 15 mL via OROMUCOSAL
  Filled 2015-03-28 (×8): qty 15

## 2015-03-28 MED ORDER — ROCURONIUM BROMIDE 50 MG/5ML IV SOLN
INTRAVENOUS | Status: AC
  Administered 2015-03-28: 5 mg
  Filled 2015-03-28: qty 2

## 2015-03-28 MED ORDER — FENTANYL CITRATE (PF) 100 MCG/2ML IJ SOLN: 50.0000 ug | Freq: Once | INTRAMUSCULAR | Status: DC

## 2015-03-28 MED ORDER — FENTANYL CITRATE (PF) 100 MCG/2ML IJ SOLN
INTRAMUSCULAR | Status: AC
  Administered 2015-03-28: 100 ug
  Filled 2015-03-28: qty 2

## 2015-03-28 MED ORDER — SUCCINYLCHOLINE CHLORIDE 20 MG/ML IJ SOLN
INTRAMUSCULAR | Status: AC
  Filled 2015-03-28: qty 1

## 2015-03-28 MED ORDER — MIDAZOLAM HCL 2 MG/2ML IJ SOLN
INTRAMUSCULAR | Status: AC
  Administered 2015-03-28: 2 mg
  Filled 2015-03-28: qty 2

## 2015-03-28 MED ORDER — EMTRICITABINE-TENOFOVIR DF 200-300 MG PO TABS
1.0000 | ORAL_TABLET | Freq: Every day | ORAL | Status: DC
  Administered 2015-03-28 – 2015-04-01 (×4): 1 via ORAL
  Filled 2015-03-28 (×7): qty 1

## 2015-03-28 MED ORDER — SODIUM CHLORIDE 0.9 % IV SOLN
1.0000 mg/h | INTRAVENOUS | Status: DC
  Administered 2015-03-28 – 2015-03-30 (×4): 1 mg/h via INTRAVENOUS
  Filled 2015-03-28 (×3): qty 10

## 2015-03-28 MED ORDER — MIDAZOLAM HCL 2 MG/2ML IJ SOLN
2.0000 mg | INTRAMUSCULAR | Status: DC | PRN
  Administered 2015-03-29 (×2): 2 mg via INTRAVENOUS

## 2015-03-28 MED ORDER — SODIUM CHLORIDE 0.9 % IV SOLN
25.0000 ug/h | INTRAVENOUS | Status: DC
  Administered 2015-03-28 – 2015-03-30 (×2): 50 ug/h via INTRAVENOUS
  Administered 2015-03-30: 150 ug/h via INTRAVENOUS
  Filled 2015-03-28 (×4): qty 50

## 2015-03-28 MED ORDER — LIDOCAINE HCL (CARDIAC) 20 MG/ML IV SOLN
INTRAVENOUS | Status: AC
  Filled 2015-03-28: qty 5

## 2015-03-28 MED ORDER — PANTOPRAZOLE SODIUM 40 MG IV SOLR
40.0000 mg | Freq: Every day | INTRAVENOUS | Status: DC
  Administered 2015-03-28: 40 mg via INTRAVENOUS
  Filled 2015-03-28: qty 40

## 2015-03-28 MED ORDER — SULFAMETHOXAZOLE-TRIMETHOPRIM 400-80 MG/5ML IV SOLN
420.0000 mg | Freq: Three times a day (TID) | INTRAVENOUS | Status: DC
  Administered 2015-03-28 – 2015-03-30 (×8): 420 mg via INTRAVENOUS
  Filled 2015-03-28 (×11): qty 26.3

## 2015-03-28 MED ORDER — MIDAZOLAM HCL 2 MG/2ML IJ SOLN: 2.0000 mg | INTRAMUSCULAR | Status: DC | PRN

## 2015-03-28 MED ORDER — ETOMIDATE 2 MG/ML IV SOLN
INTRAVENOUS | Status: AC
  Administered 2015-03-28: 20 mg
  Filled 2015-03-28: qty 20

## 2015-03-28 MED ORDER — METHYLPREDNISOLONE SODIUM SUCC 40 MG IJ SOLR
40.0000 mg | Freq: Two times a day (BID) | INTRAMUSCULAR | Status: DC
  Administered 2015-03-28 – 2015-03-31 (×8): 40 mg via INTRAVENOUS
  Filled 2015-03-28 (×8): qty 1

## 2015-03-28 MED ORDER — CETYLPYRIDINIUM CHLORIDE 0.05 % MT LIQD
7.0000 mL | Freq: Four times a day (QID) | OROMUCOSAL | Status: DC
  Administered 2015-03-28 – 2015-04-02 (×19): 7 mL via OROMUCOSAL

## 2015-03-28 MED ORDER — SODIUM CHLORIDE 0.9 % IV SOLN
INTRAVENOUS | Status: DC
  Administered 2015-03-28 – 2015-04-01 (×6): via INTRAVENOUS

## 2015-03-28 MED ORDER — NOREPINEPHRINE BITARTRATE 1 MG/ML IV SOLN
5.0000 ug/min | INTRAVENOUS | Status: DC
  Filled 2015-03-28: qty 4

## 2015-03-28 MED ORDER — FENTANYL BOLUS VIA INFUSION
50.0000 ug | INTRAVENOUS | Status: DC | PRN
  Administered 2015-03-29: 50 ug via INTRAVENOUS
  Filled 2015-03-28: qty 50

## 2015-03-28 NOTE — Progress Notes (Signed)
Respiratory equipment wiped down.

## 2015-03-28 NOTE — Progress Notes (Signed)
ANTIBIOTIC CONSULT NOTE - INITIAL  Pharmacy Consult for Bactrim Indication: Pneumocystis Pneumonia  Allergies  Allergen Reactions  . Codeine     REACTION: itching    Patient Measurements: Height: 5\' 7"  (170.2 cm) Weight: 140 lb 14 oz (63.9 kg) IBW/kg (Calculated) : 61.6 Adjusted Body Weight:   Vital Signs: Temp: 98.9 F (37.2 C) (07/09 2333) Temp Source: Axillary (07/09 2333) BP: 127/81 mmHg (07/09 2100) Pulse Rate: 119 (07/10 0036) Intake/Output from previous day: 07/09 0701 - 07/10 0700 In: 50 [IV Piggyback:50] Out: -  Intake/Output from this shift: Total I/O In: 50 [IV Piggyback:50] Out: -   Labs:  Recent Labs  03/27/15 1056 03/27/15 1327 03/27/15 1643 03/27/15 1650  WBC  --  11.7* 12.4*  --   HGB  --  11.2* 12.5  --   PLT  --  258 288  --   CREATININE 0.72  --   --  0.57   Estimated Creatinine Clearance: 75.4 mL/min (by C-G formula based on Cr of 0.57). No results for input(s): VANCOTROUGH, VANCOPEAK, VANCORANDOM, GENTTROUGH, GENTPEAK, GENTRANDOM, TOBRATROUGH, TOBRAPEAK, TOBRARND, AMIKACINPEAK, AMIKACINTROU, AMIKACIN in the last 72 hours.   Microbiology: Recent Results (from the past 720 hour(s))  MRSA PCR Screening     Status: None   Collection Time: 03/27/15  2:00 PM  Result Value Ref Range Status   MRSA by PCR NEGATIVE NEGATIVE Final    Comment:        The GeneXpert MRSA Assay (FDA approved for NASAL specimens only), is one component of a comprehensive MRSA colonization surveillance program. It is not intended to diagnose MRSA infection nor to guide or monitor treatment for MRSA infections.     Medical History: Past Medical History  Diagnosis Date  . Arthritis   . Anxiety   . GERD (gastroesophageal reflux disease)     due to anxiety  . Hyperlipidemia   . Acquired immune deficiency syndrome   . Diabetes mellitus without complication   . HIV (human immunodeficiency virus infection)     Medications:  Anti-infectives    Start      Dose/Rate Route Frequency Ordered Stop   03/28/15 1500  azithromycin (ZITHROMAX) 500 mg in dextrose 5 % 250 mL IVPB     500 mg 250 mL/hr over 60 Minutes Intravenous Every 24 hours 03/27/15 1647     03/28/15 1000  sulfamethoxazole-trimethoprim (BACTRIM DS,SEPTRA DS) 800-160 MG per tablet 1 tablet  Status:  Discontinued     1 tablet Oral Daily 03/27/15 1728 03/28/15 0047   03/28/15 0100  sulfamethoxazole-trimethoprim (BACTRIM) 420 mg of trimethoprim in dextrose 5 % 500 mL IVPB     420 mg of trimethoprim 350.8 mL/hr over 90 Minutes Intravenous 3 times per day 03/28/15 0055     03/27/15 2000  cefTRIAXone (ROCEPHIN) 1 g in dextrose 5 % 50 mL IVPB - Premix     1 g 100 mL/hr over 30 Minutes Intravenous Every 24 hours 03/27/15 1647     03/27/15 2000  dolutegravir (TIVICAY) tablet 50 mg     50 mg Oral Daily 03/27/15 1753     03/27/15 1800  dolutegravir (TIVICAY) tablet 50 mg  Status:  Discontinued     50 mg Oral Daily 03/27/15 1706 03/27/15 1753   03/27/15 1800  emtricitabine-tenofovir AF (DESCOVY) 200-25 MG per tablet 1 tablet  Status:  Discontinued     1 tablet Oral Daily 03/27/15 1706 03/27/15 1720   03/27/15 1800  emtricitabine-tenofovir AF (DESCOVY) 200-25 MG per tablet 1 tablet  1 tablet Oral Daily 03/27/15 1720     03/27/15 1645  cefTRIAXone (ROCEPHIN) 1 g in dextrose 5 % 50 mL IVPB - Premix  Status:  Discontinued     1 g 100 mL/hr over 30 Minutes Intravenous  Once 03/27/15 1642 03/27/15 1644   03/27/15 1645  azithromycin (ZITHROMAX) 500 mg in dextrose 5 % 250 mL IVPB  Status:  Discontinued     500 mg 250 mL/hr over 60 Minutes Intravenous  Once 03/27/15 1642 03/27/15 1644   03/27/15 1430  cefTRIAXone (ROCEPHIN) injection 250 mg     250 mg Intramuscular  Once 03/27/15 1415 03/27/15 1515   03/27/15 1430  azithromycin (ZITHROMAX) 500 mg in dextrose 5 % 250 mL IVPB     500 mg 250 mL/hr over 60 Minutes Intravenous  Once 03/27/15 1415 03/27/15 1614   03/27/15 1430   sulfamethoxazole-trimethoprim (BACTRIM DS,SEPTRA DS) 800-160 MG per tablet 1 tablet     1 tablet Oral  Once 03/27/15 1415 03/27/15 1514     Assessment: MD wishes for pharmacy to dose bactrim to Pneumocystis Pneumonia.    Goal of Therapy:  Bactrim dosed based on patient weight and renal function   Plan:  Follow up culture results  Bactrim 420mg  iv q8hr  Tyler Deis, Shea Stakes [place]ford 03/28/2015,12:57 AM

## 2015-03-28 NOTE — Consult Note (Signed)
[facility]  Total days of antibiotics 2        Day 2 ceftriaxone        Day 2 azithrom        Day 2 bactrim/ steroids       Reason for Consult: advanced HIV disease, presumed PCP   Referring Physician: ramaswamy  Principal Problem:   Severe sepsis with septic shock Active Problems:   HIV (human immunodeficiency virus infection)   CAP (community acquired pneumonia)   Leukocytosis   Acute respiratory failure with hypoxia    HPI: Courtney Escobar is a 59 y.o. female with hx of RA, DM, hx of ltbi tx in 2001 and recent diagnosed of HIV disease in June 2016 in setting of 30 lb unexplained weight loss. Cd 4 count 12 /VL 119,000 (geno L90M resistance), had first visit to [facility] with Dr. Johnnye Sima on 6/28. She was started on tivicay daily, truvada daily as well as azithromycin weekly and bactrim Ds daily for oi proph. She presents on 7/9 to the ED with worsening shortness of breath, dry cough, fever of 102.3F, found to have tachypnea, tachycardia nad hypotension with sBP 80s. She developed respiratory distress requiring intubation. She was empirically started on tx doses of bactrim for presumed pcp, steroids, azithromycin and ceftriaxone for CAP. PCCM management of vent and started on ARDS protocol. Labs revealed leukocytosis fo 12 down to 10 this am.La of 3.5. cxr showed diffuse bilateral infiltrates. CTA confirmed Extensive airspace opacities throughout both lungs involvingbases worse than apices, left greater than right. Asymmetric edema, pneumonia, and a variety of inflammatory processes can give thisappearance. Bilateral hilar and mediastinal adenopathy, possibly reactive butnonspecific. Negative for acute PE.  I spoke with her husband and daughter (from [place]) who mentioned that they know of her HIV status. She has been taking her meds routinely for the last 7-10 days since starting them. Her husband has noticed that she had been tachypneic at rest for several days  roughly 2 wks but worsening in the last day prior to admit.   They are looking for new pcp who decided to no longer se her.they also feel that she would need counseling in adjusting to new diagnosis   Past Medical History  Diagnosis Date  . Arthritis   . Anxiety   . GERD (gastroesophageal reflux disease)     due to anxiety  . Hyperlipidemia   . Acquired immune deficiency syndrome   . Diabetes mellitus without complication   . HIV (human immunodeficiency virus infection)     Allergies:  Allergies  Allergen Reactions  . Codeine     REACTION: itching    MEDICATIONS: . antiseptic oral rinse  7 mL Mouth Rinse QID  . azithromycin  500 mg Intravenous Q24H  . cefTRIAXone (ROCEPHIN)  IV  1 g Intravenous Q24H  . chlorhexidine  15 mL Mouth Rinse BID  . dolutegravir  50 mg Oral Daily  . emtricitabine-tenofovir AF  1 tablet Oral Daily  . fentaNYL (SUBLIMAZE) injection  50 mcg Intravenous Once  . ipratropium-albuterol  3 mL Nebulization Q4H  . methylPREDNISolone (SOLU-MEDROL) injection  40 mg Intravenous BID  . pantoprazole (PROTONIX) IV  40 mg Intravenous Daily  . sodium chloride  1,000 mL Intravenous Once  . sodium chloride  3 mL Intravenous Q12H  . sulfamethoxazole-trimethoprim  420 mg of trimethoprim Intravenous 3 times per day    History  Substance Use Topics  . Smoking status: Former Smoker    Types:  Cigarettes    Quit date: 05/22/1996  . Smokeless tobacco: Never Used  . Alcohol Use: No     Comment: none since 10/19/14    Family History  Problem Relation Age of Onset  . Hypertension Sister   . Arthritis Brother     ?rheumatoid arthritis, dermal changes  . Cancer Brother   . Tuberculosis Father     Review of Systems -  Unable to obtain due to intubation and sedation OBJECTIVE: Temp:  [98.1 F (36.7 C)-102.8 F (39.3 C)] 99.1 F (37.3 C) (07/10 0800) Pulse Rate:  [95-147] 109 (07/10 0800) Resp:  [19-59] 29 (07/10 0800) BP: (80-165)/(59-137) 89/66 mmHg (07/10  0800) SpO2:  [93 %-100 %] 96 % (07/10 1131) FiO2 (%):  [50 %-100 %] 50 % (07/10 1133) Weight:  [140 lb 14 oz (63.9 kg)] 140 lb 14 oz (63.9 kg) (07/09 1618) Physical Exam  Constitutional:  Sedated, intubated.appears well-developed and well-nourished. No distress.  HENT: [place]/AT, PERRLA, no scleral icterus Mouth/Throat: Oropharynx is clear and moist. No oropharyngeal exudate.  Cardiovascular: Normal rate, regular rhythm and normal heart sounds. Exam reveals no gallop and no friction rub.  No murmur heard.  Pulmonary/Chest: Effort normal and breath sounds normal. No respiratory distress.  has no wheezes.  Neck = supple, no nuchal rigidity Abdominal: Soft. Bowel sounds are decreased  exhibits no distension. There is no tenderness.  Lymphadenopathy: no cervical adenopathy. No axillary adenopathy Skin: Skin is warm and dry. No rash noted. No erythema.   LABS: Results for orders placed or performed during the hospital encounter of 03/27/15 (from the past 48 hour(s))  CBG monitoring, ED     Status: None   Collection Time: 03/27/15 10:30 AM  Result Value Ref Range   Glucose-Capillary 82 65 - 99 mg/dL  Brain natriuretic peptide     Status: None   Collection Time: 03/27/15 10:53 AM  Result Value Ref Range   B Natriuretic Peptide 50.4 0.0 - 100.0 pg/mL  Basic metabolic panel     Status: Abnormal   Collection Time: 03/27/15 10:56 AM  Result Value Ref Range   Sodium 134 (L) 135 - 145 mmol/L   Potassium 3.8 3.5 - 5.1 mmol/L   Chloride 101 101 - 111 mmol/L   CO2 21 (L) 22 - 32 mmol/L   Glucose, Bld 134 (H) 65 - 99 mg/dL   BUN 11 6 - 20 mg/dL   Creatinine, Ser 0.72 0.44 - 1.00 mg/dL   Calcium 8.6 (L) 8.9 - 10.3 mg/dL   GFR calc non Af Amer >60 >60 mL/min   GFR calc Af Amer >60 >60 mL/min    Comment: (NOTE) The eGFR has been calculated using the CKD EPI equation. This calculation has not been validated in all clinical situations. eGFR's persistently <60 mL/min signify possible Chronic  Kidney Disease.    Anion gap 12 5 - 15  D-dimer, quantitative (not at [facility])     Status: Abnormal   Collection Time: 03/27/15 10:56 AM  Result Value Ref Range   D-Dimer, Quant 3.14 (H) 0.00 - 0.48 ug/mL-FEU    Comment:        AT THE INHOUSE ESTABLISHED CUTOFF VALUE OF 0.48 ug/mL FEU, THIS ASSAY HAS BEEN DOCUMENTED IN THE LITERATURE TO HAVE A SENSITIVITY AND NEGATIVE PREDICTIVE VALUE OF AT LEAST 98 TO 99%.  THE TEST RESULT SHOULD BE CORRELATED WITH AN ASSESSMENT OF THE CLINICAL PROBABILITY OF DVT / VTE.   I-stat troponin, ED     Status: None  Collection Time: 03/27/15 11:02 AM  Result Value Ref Range   Troponin i, poc 0.01 0.00 - 0.08 ng/mL   Comment 3            Comment: Due to the release kinetics of cTnI, a negative result within the first hours of the onset of symptoms does not rule out myocardial infarction with certainty. If myocardial infarction is still suspected, repeat the test at appropriate intervals.   CBC with Differential     Status: Abnormal   Collection Time: 03/27/15  1:27 PM  Result Value Ref Range   WBC 11.7 (H) 4.0 - 10.5 K/uL   RBC 3.51 (L) 3.87 - 5.11 MIL/uL   Hemoglobin 11.2 (L) 12.0 - 15.0 g/dL   HCT 32.4 (L) 36.0 - 46.0 %   MCV 92.3 78.0 - 100.0 fL   MCH 31.9 26.0 - 34.0 pg   MCHC 34.6 30.0 - 36.0 g/dL   RDW 14.7 11.5 - 15.5 %   Platelets 258 150 - 400 K/uL   Neutrophils Relative % 84 (H) 43 - 77 %   Lymphocytes Relative 10 (L) 12 - 46 %   Monocytes Relative 3 3 - 12 %   Eosinophils Relative 2 0 - 5 %   Basophils Relative 1 0 - 1 %   Neutro Abs 9.8 (H) 1.7 - 7.7 K/uL   Lymphs Abs 1.2 0.7 - 4.0 K/uL   Monocytes Absolute 0.4 0.1 - 1.0 K/uL   Eosinophils Absolute 0.2 0.0 - 0.7 K/uL   Basophils Absolute 0.1 0.0 - 0.1 K/uL   RBC Morphology POLYCHROMASIA PRESENT    WBC Morphology ATYPICAL LYMPHOCYTES   MRSA PCR Screening     Status: None   Collection Time: 03/27/15  2:00 PM  Result Value Ref Range   MRSA by PCR NEGATIVE NEGATIVE     Comment:        The GeneXpert MRSA Assay (FDA approved for NASAL specimens only), is one component of a comprehensive MRSA colonization surveillance program. It is not intended to diagnose MRSA infection nor to guide or monitor treatment for MRSA infections.   Glucose, capillary     Status: None   Collection Time: 03/27/15  4:41 PM  Result Value Ref Range   Glucose-Capillary 82 65 - 99 mg/dL  CBC with Differential     Status: Abnormal   Collection Time: 03/27/15  4:43 PM  Result Value Ref Range   WBC 12.4 (H) 4.0 - 10.5 K/uL   RBC 3.86 (L) 3.87 - 5.11 MIL/uL   Hemoglobin 12.5 12.0 - 15.0 g/dL   HCT 35.9 (L) 36.0 - 46.0 %   MCV 93.0 78.0 - 100.0 fL   MCH 32.4 26.0 - 34.0 pg   MCHC 34.8 30.0 - 36.0 g/dL   RDW 14.9 11.5 - 15.5 %   Platelets 288 150 - 400 K/uL   Neutrophils Relative % 84 (H) 43 - 77 %   Lymphocytes Relative 10 (L) 12 - 46 %   Monocytes Relative 3 3 - 12 %   Eosinophils Relative 2 0 - 5 %   Basophils Relative 1 0 - 1 %   Neutro Abs 10.5 (H) 1.7 - 7.7 K/uL   Lymphs Abs 1.2 0.7 - 4.0 K/uL   Monocytes Absolute 0.4 0.1 - 1.0 K/uL   Eosinophils Absolute 0.2 0.0 - 0.7 K/uL   Basophils Absolute 0.1 0.0 - 0.1 K/uL   WBC Morphology ATYPICAL LYMPHOCYTES   Lactic acid, plasma  Status: Abnormal   Collection Time: 03/27/15  4:43 PM  Result Value Ref Range   Lactic Acid, Venous 2.6 (HH) 0.5 - 2.0 mmol/L    Comment: RESULT REPEATED AND VERIFIED CRITICAL RESULT CALLED TO, READ BACK BY AND VERIFIED WITH: SOL AT 1838 ON 03/27/15 BY S.VANHOORNE   Procalcitonin     Status: None   Collection Time: 03/27/15  4:43 PM  Result Value Ref Range   Procalcitonin 1.83 ng/mL    Comment:        Interpretation: PCT > 0.5 ng/mL and <= 2 ng/mL: Systemic infection (sepsis) is possible, but other conditions are known to elevate PCT as well. (NOTE)         ICU PCT Algorithm               Non ICU PCT Algorithm    ----------------------------     ------------------------------          PCT < 0.25 ng/mL                 PCT < 0.1 ng/mL     Stopping of antibiotics            Stopping of antibiotics       strongly encouraged.               strongly encouraged.    ----------------------------     ------------------------------       PCT level decrease by               PCT < 0.25 ng/mL       >= 80% from peak PCT       OR PCT 0.25 - 0.5 ng/mL          Stopping of antibiotics                                             encouraged.     Stopping of antibiotics           encouraged.    ----------------------------     ------------------------------       PCT level decrease by              PCT >= 0.25 ng/mL       < 80% from peak PCT        AND PCT >= 0.5 ng/mL             Continuing antibiotics                                              encouraged.       Continuing antibiotics            encouraged.    ----------------------------     ------------------------------     PCT level increase compared          PCT > 0.5 ng/mL         with peak PCT AND          PCT >= 0.5 ng/mL             Escalation of antibiotics  strongly encouraged.      Escalation of antibiotics        strongly encouraged.   Phosphorus     Status: None   Collection Time: 03/27/15  4:50 PM  Result Value Ref Range   Phosphorus 3.2 2.5 - 4.6 mg/dL  Magnesium     Status: None   Collection Time: 03/27/15  4:50 PM  Result Value Ref Range   Magnesium 1.7 1.7 - 2.4 mg/dL  Basic metabolic panel     Status: Abnormal   Collection Time: 03/27/15  4:50 PM  Result Value Ref Range   Sodium 135 135 - 145 mmol/L   Potassium 3.5 3.5 - 5.1 mmol/L   Chloride 101 101 - 111 mmol/L   CO2 24 22 - 32 mmol/L   Glucose, Bld 74 65 - 99 mg/dL   BUN 11 6 - 20 mg/dL   Creatinine, Ser 0.57 0.44 - 1.00 mg/dL   Calcium 8.1 (L) 8.9 - 10.3 mg/dL   GFR calc non Af Amer >60 >60 mL/min   GFR calc Af Amer >60 >60 mL/min    Comment: (NOTE) The eGFR has been calculated using the CKD EPI  equation. This calculation has not been validated in all clinical situations. eGFR's persistently <60 mL/min signify possible Chronic Kidney Disease.    Anion gap 10 5 - 15  APTT     Status: None   Collection Time: 03/27/15  5:30 PM  Result Value Ref Range   aPTT 24 24 - 37 seconds  Protime-INR     Status: Abnormal   Collection Time: 03/27/15  5:30 PM  Result Value Ref Range   Prothrombin Time 15.6 (H) 11.6 - 15.2 seconds   INR 1.22 0.00 - 1.49  TSH     Status: None   Collection Time: 03/27/15  5:30 PM  Result Value Ref Range   TSH 0.978 0.350 - 4.500 uIU/mL  Influenza panel by pcr     Status: None   Collection Time: 03/27/15  6:07 PM  Result Value Ref Range   Influenza A By PCR NEGATIVE NEGATIVE   Influenza B By PCR NEGATIVE NEGATIVE   H1N1 flu by pcr NOT DETECTED NOT DETECTED    Comment:        The Xpert Flu assay (FDA approved for nasal aspirates or washes and nasopharyngeal swab specimens), is intended as an aid in the diagnosis of influenza and should not be used as a sole basis for treatment. Performed at [facility]   Lactic acid, plasma     Status: Abnormal   Collection Time: 03/27/15  7:43 PM  Result Value Ref Range   Lactic Acid, Venous 2.9 (HH) 0.5 - 2.0 mmol/L    Comment: REPEATED TO VERIFY CRITICAL RESULT CALLED TO, READ BACK BY AND VERIFIED WITH: C SMITH RN [id number] 03/27/15 A NAVARRO   Blood gas, arterial     Status: Abnormal   Collection Time: 03/27/15  7:44 PM  Result Value Ref Range   O2 Content 6.0 L/min   Delivery systems NASAL CANNULA    pH, Arterial 7.517 (H) 7.350 - 7.450   pCO2 arterial 29.5 (L) 35.0 - 45.0 mmHg   pO2, Arterial 50.2 (L) 80.0 - 100.0 mmHg   Bicarbonate 23.9 20.0 - 24.0 mEq/L   TCO2 21.1 0 - 100 mmol/L   Acid-Base Excess 1.9 0.0 - 2.0 mmol/L   O2 Saturation 84.6 %   Patient temperature 98.2    Collection site LEFT RADIAL  Drawn by [id number]    Sample type ARTERIAL DRAW    Allens test (pass/fail) PASS PASS  Draw ABG 1  hour after initiation of ventilator     Status: Abnormal   Collection Time: 03/28/15  1:45 AM  Result Value Ref Range   FIO2 1.00 %   Delivery systems VENTILATOR    Mode PRESSURE REGULATED VOLUME CONTROL    VT 490 mL   Rate 20 resp/min   Peep/cpap 5.0 cm H20   pH, Arterial 7.347 (L) 7.350 - 7.450   pCO2 arterial 42.0 35.0 - 45.0 mmHg   pO2, Arterial 94.6 80.0 - 100.0 mmHg   Bicarbonate 22.4 20.0 - 24.0 mEq/L   TCO2 20.6 0 - 100 mmol/L   Acid-base deficit 2.6 (H) 0.0 - 2.0 mmol/L   O2 Saturation 95.3 %   Patient temperature 98.8    Collection site RIGHT RADIAL    Drawn by [id number]    Sample type ARTERIAL DRAW    Allens test (pass/fail) PASS PASS  Blood gas, arterial     Status: Abnormal   Collection Time: 03/28/15  3:10 AM  Result Value Ref Range   FIO2 0.80 %   Delivery systems VENTILATOR    Mode PRESSURE REGULATED VOLUME CONTROL    VT 370 mL   Rate 28 resp/min   Peep/cpap 10.0 cm H20   pH, Arterial 7.205 (L) 7.350 - 7.450   pCO2 arterial 59.6 (HH) 35.0 - 45.0 mmHg    Comment: CRITICAL RESULT CALLED TO, READ BACK BY AND VERIFIED WITH:  Polly Cobia, RN AT [phone] BY JESSICA NEUGENT,RRT,RCP ON 03/28/15    pO2, Arterial 109 (H) 80.0 - 100.0 mmHg   Bicarbonate 22.4 20.0 - 24.0 mEq/L   TCO2 20.3 0 - 100 mmol/L   Acid-base deficit 6.1 (H) 0.0 - 2.0 mmol/L   O2 Saturation 94.6 %   Patient temperature 37.8    Collection site RIGHT BRACHIAL    Drawn by [id number]    Sample type ARTERIAL DRAW   Carboxyhemoglobin     Status: None   Collection Time: 03/28/15  3:19 AM  Result Value Ref Range   Total hemoglobin 13.3 12.0 - 16.0 g/dL   O2 Saturation 87.2 %   Carboxyhemoglobin 0.9 0.5 - 1.5 %   Methemoglobin 0.8 0.0 - 1.5 %  Urinalysis, Routine w reflex microscopic (not at [facility])     Status: Abnormal   Collection Time: 03/28/15  3:27 AM  Result Value Ref Range   Color, Urine AMBER (A) YELLOW    Comment: BIOCHEMICALS MAY BE AFFECTED BY COLOR   APPearance CLOUDY (A) CLEAR   Specific  Gravity, Urine 1.020 1.005 - 1.030   pH 6.0 5.0 - 8.0   Glucose, UA NEGATIVE NEGATIVE mg/dL   Hgb urine dipstick NEGATIVE NEGATIVE   Bilirubin Urine SMALL (A) NEGATIVE   Ketones, ur NEGATIVE NEGATIVE mg/dL   Protein, ur 30 (A) NEGATIVE mg/dL   Urobilinogen, UA 1.0 0.0 - 1.0 mg/dL   Nitrite NEGATIVE NEGATIVE   Leukocytes, UA NEGATIVE NEGATIVE  Urine microscopic-add on     Status: Abnormal   Collection Time: 03/28/15  3:27 AM  Result Value Ref Range   WBC, UA 0-2 <3 WBC/hpf   Casts HYALINE CASTS (A) NEGATIVE    Comment: GRANULAR CAST   Urine-Other AMORPHOUS URATES/PHOSPHATES   Blood gas, arterial     Status: Abnormal   Collection Time: 03/28/15  4:56 AM  Result Value Ref Range   FIO2 0.70 %  Delivery systems VENTILATOR    Mode PRESSURE REGULATED VOLUME CONTROL    VT 370 mL   Rate 35 resp/min   Peep/cpap 10.0 cm H20   pH, Arterial 7.255 (L) 7.350 - 7.450   pCO2 arterial 49.0 (H) 35.0 - 45.0 mmHg   pO2, Arterial 105 (H) 80.0 - 100.0 mmHg   Bicarbonate 20.9 20.0 - 24.0 mEq/L   TCO2 19.7 0 - 100 mmol/L   Acid-base deficit 5.7 (H) 0.0 - 2.0 mmol/L   O2 Saturation 95.6 %   Patient temperature 99.3    Collection site LEFT RADIAL    Drawn by [id number]    Sample type ARTERIAL DRAW    Allens test (pass/fail) PASS PASS  Glucose, capillary     Status: Abnormal   Collection Time: 03/28/15  5:16 AM  Result Value Ref Range   Glucose-Capillary 123 (H) 65 - 99 mg/dL  Comprehensive metabolic panel     Status: Abnormal   Collection Time: 03/28/15  6:25 AM  Result Value Ref Range   Sodium 136 135 - 145 mmol/L   Potassium 3.7 3.5 - 5.1 mmol/L   Chloride 106 101 - 111 mmol/L   CO2 24 22 - 32 mmol/L   Glucose, Bld 97 65 - 99 mg/dL   BUN 14 6 - 20 mg/dL   Creatinine, Ser 0.56 0.44 - 1.00 mg/dL   Calcium 7.8 (L) 8.9 - 10.3 mg/dL   Total Protein 5.9 (L) 6.5 - 8.1 g/dL   Albumin 1.6 (L) 3.5 - 5.0 g/dL   AST 148 (H) 15 - 41 U/L   ALT 65 (H) 14 - 54 U/L   Alkaline Phosphatase 337 (H) 38 - 126  U/L   Total Bilirubin 2.1 (H) 0.3 - 1.2 mg/dL   GFR calc non Af Amer >60 >60 mL/min   GFR calc Af Amer >60 >60 mL/min    Comment: (NOTE) The eGFR has been calculated using the CKD EPI equation. This calculation has not been validated in all clinical situations. eGFR's persistently <60 mL/min signify possible Chronic Kidney Disease.    Anion gap 6 5 - 15  CBC     Status: Abnormal   Collection Time: 03/28/15  7:42 AM  Result Value Ref Range   WBC 13.6 (H) 4.0 - 10.5 K/uL   RBC 3.18 (L) 3.87 - 5.11 MIL/uL   Hemoglobin 10.2 (L) 12.0 - 15.0 g/dL   HCT 30.2 (L) 36.0 - 46.0 %   MCV 95.0 78.0 - 100.0 fL   MCH 32.1 26.0 - 34.0 pg   MCHC 33.8 30.0 - 36.0 g/dL   RDW 15.3 11.5 - 15.5 %   Platelets 223 150 - 400 K/uL  Glucose, capillary     Status: Abnormal   Collection Time: 03/28/15  7:42 AM  Result Value Ref Range   Glucose-Capillary 163 (H) 65 - 99 mg/dL  CBC with Differential/Platelet     Status: Abnormal (Preliminary result)   Collection Time: 03/28/15  2:30 PM  Result Value Ref Range   WBC 10.6 (H) 4.0 - 10.5 K/uL   RBC 3.07 (L) 3.87 - 5.11 MIL/uL   Hemoglobin 9.7 (L) 12.0 - 15.0 g/dL   HCT 29.1 (L) 36.0 - 46.0 %   MCV 94.8 78.0 - 100.0 fL   MCH 31.6 26.0 - 34.0 pg   MCHC 33.3 30.0 - 36.0 g/dL   RDW 15.3 11.5 - 15.5 %   Platelets 221 150 - 400 K/uL   Neutrophils Relative % PENDING 43 -  77 %   Neutro Abs PENDING 1.7 - 7.7 K/uL   Band Neutrophils PENDING 0 - 10 %   Lymphocytes Relative PENDING 12 - 46 %   Lymphs Abs PENDING 0.7 - 4.0 K/uL   Monocytes Relative PENDING 3 - 12 %   Monocytes Absolute PENDING 0.1 - 1.0 K/uL   Eosinophils Relative PENDING 0 - 5 %   Eosinophils Absolute PENDING 0.0 - 0.7 K/uL   Basophils Relative PENDING 0 - 1 %   Basophils Absolute PENDING 0.0 - 0.1 K/uL   WBC Morphology PENDING    RBC Morphology PENDING    Smear Review PENDING    nRBC PENDING 0 /100 WBC   Metamyelocytes Relative PENDING %   Myelocytes PENDING %   Promyelocytes Absolute  PENDING %   Blasts PENDING %  Basic metabolic panel     Status: Abnormal   Collection Time: 03/28/15  2:30 PM  Result Value Ref Range   Sodium 133 (L) 135 - 145 mmol/L   Potassium 4.0 3.5 - 5.1 mmol/L   Chloride 105 101 - 111 mmol/L   CO2 22 22 - 32 mmol/L   Glucose, Bld 189 (H) 65 - 99 mg/dL   BUN 13 6 - 20 mg/dL   Creatinine, Ser 0.52 0.44 - 1.00 mg/dL   Calcium 7.6 (L) 8.9 - 10.3 mg/dL   GFR calc non Af Amer >60 >60 mL/min   GFR calc Af Amer >60 >60 mL/min    Comment: (NOTE) The eGFR has been calculated using the CKD EPI equation. This calculation has not been validated in all clinical situations. eGFR's persistently <60 mL/min signify possible Chronic Kidney Disease.    Anion gap 6 5 - 15  Magnesium     Status: None   Collection Time: 03/28/15  2:30 PM  Result Value Ref Range   Magnesium 1.8 1.7 - 2.4 mg/dL    MICRO:  IMAGING: Dg Chest 2 View  03/27/2015   CLINICAL DATA:  Cough, upper back pain, HIV  EXAM: CHEST  2 VIEW  COMPARISON:  03/09/2015  FINDINGS: Multifocal patchy opacities bilaterally with a mid/lower lung predominance, worrisome for multifocal pneumonia.  No pleural effusion or pneumothorax.  The heart is normal in size.  Visualized osseous structures are within normal limits.  IMPRESSION: Multifocal patchy opacities bilaterally, suspicious for multifocal pneumonia.   Electronically Signed   By: Julian Hy M.D.   On: 03/27/2015 11:49   Ct Angio Chest Pe W/cm &/or Wo Cm  03/27/2015   CLINICAL DATA:  polyuria, esp. At night x ~3 months. She states she was recently dx with type II diabetes; however, has not received any medications for treatment of same. Her skin is normal, warm and dry and she hyperventilates when observed. At times when observed surreptitiously her respirations are normal. Sob with tachycardic progressive over a week  EXAM: CT ANGIOGRAPHY CHEST WITH CONTRAST  TECHNIQUE: Multidetector CT imaging of the chest was performed using the standard  protocol during bolus administration of intravenous contrast. Multiplanar CT image reconstructions and MIPs were obtained to evaluate the vascular anatomy.  CONTRAST:  50m OMNIPAQUE IOHEXOL 350 MG/ML SOLN  COMPARISON:  None.  FINDINGS: Left arm contrast injection. The SVC is patent. Right ventricle nondilated. Satisfactory opacification of pulmonary arteries noted, and there is no evidence of pulmonary emboli. Patent pulmonary veins bilaterally. Adequate contrast opacification of the thoracic aorta with no evidence of dissection, aneurysm, or stenosis. There is bovine variant brachiocephalic arch anatomy without proximal stenosis.  Trace left pleural effusion. No pericardial effusion. Bilateral hilar, subcarinal, and precarinal adenopathy. Emphysematous changes most marked in the apices. Extensive airspace consolidation throughout both lower lobes left worse than right, with a patchy airspace in extensive alveolar opacities throughout the lingula and right middle lobe. There peripheral airspace and alveolar opacities in both upper lobes. Innumerable subcentimeter cystic lucencies scattered throughout both lungs primarily peripherally and in the bases. Lumbar spine and sternum intact. Visualized portions of upper abdomen unremarkable.  Review of the MIP images confirms the above findings.  IMPRESSION: 1. Extensive airspace opacities throughout both lungs involving bases worse than apices, left greater than right. Asymmetric edema, pneumonia, and a variety of inflammatory processes can give this appearance. 2. Bilateral hilar and mediastinal adenopathy, possibly reactive but nonspecific. 3. Negative for acute PE or thoracic aortic dissection.   Electronically Signed   By: Lucrezia Europe M.D.   On: 03/27/2015 12:47   Dg Chest Port 1 View  03/28/2015   CLINICAL DATA:  Acute respiratory failure with hypoxia. Severe sepsis was septic shock. HIV. On ventilator.  EXAM: PORTABLE CHEST - 1 VIEW  COMPARISON:  03/28/2015   FINDINGS: Support lines and tubes in appropriate position. Diffuse symmetric bilateral airspace disease shows no significant interval change. No evidence of pneumothorax or definite pleural effusion. Heart size remains within normal limits.  IMPRESSION: Diffuse bilateral airspace disease, without significant change.   Electronically Signed   By: Earle Gell M.D.   On: 03/28/2015 07:36   Dg Chest Port 1 View  03/28/2015   CLINICAL DATA:  Encounter for central line placement  EXAM: PORTABLE CHEST - 1 VIEW  COMPARISON:  Chest x-ray from yesterday  FINDINGS: New left IJ central line, tip at the upper SVC. No pneumothorax. New endotracheal tube with tip just below the clavicular heads. A orogastric tube reaches the stomach at least.  Unchanged widespread airspace disease. Normal heart size and stable mediastinal contours. No evidence of effusion.  IMPRESSION: 1. New tubes and central line are in good position. No pneumothorax. 2. Unchanged widespread airspace disease.   Electronically Signed   By: Monte Fantasia M.D.   On: 03/28/2015 02:18    Assessment/Plan:  59yo F with recent diagnosis of AIDS, just started tivicay and truvada beginning of July. She now presents with fevers, respiratory distress requiring intubation, cxr showing diffuse bilateral infiltrate concerning for PCP pneumonia. This is possibly the presentation of "unmasking" the opportunistic infection in setting of early immune reconstitution.  - recommend to continue with bactrim treatment doses plus steroids for PCP pneumonia - please get PCP IFA - continue with HIV regimen of tivicay daily plus truvada daily - can continue with CAP with ceftriaxone and azithromycin - recommend to get AFB blood culture looking for MAC  Dr Johnnye Sima to provide further recs tomorrow.  [facility] [phone]

## 2015-03-28 NOTE — Procedures (Signed)
Intubation Procedure Note Courtney Escobar [id number] 1955-11-08  Procedure: Intubation Indications: Respiratory insufficiency  Procedure Details Consent: Risks of procedure as well as the alternatives and risks of each were explained to the (patient/caregiver).  Consent for procedure obtained. Time Out: Verified patient identification, verified procedure, site/side was marked, verified correct patient position, special equipment/implants available, medications/allergies/relevent history reviewed, required imaging and test results available.  Performed  Maximum sterile technique was used including gloves, gown, hand hygiene and mask.  MAC and 3  Versed 4 mg  fentanyl 200 mcg - in divided doses Etomidate 20 mg Rocuronium 50  Evaluation Hemodynamic Status: BP stable throughout; O2 sats: stable throughout Patient's Current Condition: stable Complications: No apparent complications Patient did tolerate procedure well. Chest X-ray ordered to verify placement.  CXR: pending.   Debria Broecker V. 03/28/2015

## 2015-03-28 NOTE — Consult Note (Signed)
PULMONARY / CRITICAL CARE MEDICINE   Name: Courtney Escobar MRN: [id number] DOB: 11-17-1955    ADMISSION DATE:  03/27/2015 CONSULTATION DATE:  03/28/2015  REFERRING MD :  Charlies Silvers TRH  CHIEF COMPLAINT:   Resp distress, ARDS, poss PCP PNA  INITIAL PRESENTATION:  59 y.o.F  HIV on ART adm PM 7/9 to [facility] from ED with progressive dyspnea over months to acutely worsening and PCCM consulted early AM 03/28/2015 for bilateral progressive infiltrate. ?PCP PNA? PMH - RA , chronic hepatitis, neg liver bx  STUDIES:  7/9 CT Chest:  Bilateral infiltrates  SIGNIFICANT EVENTS: 6/28 ID consult reviewed 7/20 intubated   HISTORY OF PRESENT ILLNESS:   59 y.o.F with HIV presents with bilateral infiltrates and progressive decline x 2 weeks with non productive cough & dyspnea.  Adm to [facility] PM 7/9 and progressed with high lactate and resp distress early AM 03/28/2015. PCCM consulted. She was just diagnosed few weeks ago & started on HAART, CD4 was 12 . She also has RA , chronic hepatitis, neg liver bx Denies IVDU  PAST MEDICAL HISTORY :   has a past medical history of Arthritis; Anxiety; GERD (gastroesophageal reflux disease); Hyperlipidemia; Acquired immune deficiency syndrome; Diabetes mellitus without complication; and HIV (human immunodeficiency virus infection).  has past surgical history that includes Tonsillectomy; Foot surgery; and Cholecystectomy. Prior to Admission medications   Medication Sig Start Date End Date Taking? Authorizing Provider  dolutegravir (TIVICAY) 50 MG tablet Take 1 tablet (50 mg total) by mouth daily. 03/16/15  Yes Campbell Riches, MD  emtricitabine-tenofovir AF (DESCOVY) 200-25 MG per tablet Take 1 tablet by mouth daily. 03/16/15  Yes Campbell Riches, MD  fluocinonide ointment (LIDEX) 5.09 % Apply 1 application topically daily.  03/16/15  Yes Historical Provider, MD  sulfamethoxazole-trimethoprim (BACTRIM) 400-80 MG per tablet Take 1 tablet by mouth 3 (three) times a week. 03/16/15   Yes Campbell Riches, MD  azithromycin (ZITHROMAX) 600 MG tablet Take 2 tablets (1,200 mg total) by mouth once a week. 03/16/15   Campbell Riches, MD   Allergies  Allergen Reactions  . Codeine     REACTION: itching    FAMILY HISTORY:  indicated that her mother is alive. She indicated that her father is deceased. She indicated that her sister is alive. She indicated that only one of her two brothers is alive. She indicated that her daughter is alive.  SOCIAL HISTORY:  reports that she quit smoking about 18 years ago. Her smoking use included Cigarettes. She has never used smokeless tobacco. She reports that she does not drink alcohol or use illicit drugs.  REVIEW OF SYSTEMS:   Not able to obtain, pt in distress.  SUBJECTIVE:   VITAL SIGNS: Temp:  [98.2 F (36.8 C)-102.8 F (39.3 C)] 98.9 F (37.2 C) (07/09 2333) Pulse Rate:  [95-150] 118 (07/09 2100) Resp:  [22-59] 59 (07/09 2100) BP: (123-164)/(74-97) 127/81 mmHg (07/09 2100) SpO2:  [88 %-100 %] 100 % (07/09 2100) FiO2 (%):  [55 %] 55 % (07/09 2327) Weight:  [63.9 kg (140 lb 14 oz)] 63.9 kg (140 lb 14 oz) (07/09 1618) HEMODYNAMICS:   VENTILATOR SETTINGS: Vent Mode:  [-]  FiO2 (%):  [55 %] 55 % INTAKE / OUTPUT:  Intake/Output Summary (Last 24 hours) at 03/28/15 0029 Last data filed at 03/27/15 2052  Gross per 24 hour  Intake     50 ml  Output      0 ml  Net     50 ml  PHYSICAL EXAMINATION: Gen. Pleasant, thin, acutely ill, in mod resp distress, on bipap, anxious affect ENT - no lesions, no post nasal drip Neck: No JVD, no thyromegaly, no carotid bruits Lungs: use of accessory muscles, no dullness to percussion, BL air entry + without rales or rhonchi  Cardiovascular: Rhythm regular, heart sounds  normal, no murmurs, no peripheral edema Abdomen: soft and non-tender, no hepatosplenomegaly, BS normal. Musculoskeletal: No deformities, no cyanosis or clubbing Neuro:  alert, non focal Skin:  Warm, no lesions/  rash   LABS:  CBC  Recent Labs Lab 03/27/15 1327 03/27/15 1643  WBC 11.7* 12.4*  HGB 11.2* 12.5  HCT 32.4* 35.9*  PLT 258 288   Coag's  Recent Labs Lab 03/27/15 1730  APTT 24  INR 1.22   BMET  Recent Labs Lab 03/27/15 1056 03/27/15 1650  NA 134* 135  K 3.8 3.5  CL 101 101  CO2 21* 24  BUN 11 11  CREATININE 0.72 0.57  GLUCOSE 134* 74   Electrolytes  Recent Labs Lab 03/27/15 1056 03/27/15 1650  CALCIUM 8.6* 8.1*  MG  --  1.7  PHOS  --  3.2   Sepsis Markers  Recent Labs Lab 03/27/15 1643 03/27/15 1943  LATICACIDVEN 2.6* 2.9*  PROCALCITON 1.83  --    ABG  Recent Labs Lab 03/27/15 1944  PHART 7.517*  PCO2ART 29.5*  PO2ART 50.2*   Liver Enzymes No results for input(s): AST, ALT, ALKPHOS, BILITOT, ALBUMIN in the last 168 hours. Cardiac Enzymes No results for input(s): TROPONINI, PROBNP in the last 168 hours. Glucose  Recent Labs Lab 03/27/15 1030 03/27/15 1641  GLUCAP 82 82    Imaging Dg Chest 2 View  03/27/2015   CLINICAL DATA:  Cough, upper back pain, HIV  EXAM: CHEST  2 VIEW  COMPARISON:  03/09/2015  FINDINGS: Multifocal patchy opacities bilaterally with a mid/lower lung predominance, worrisome for multifocal pneumonia.  No pleural effusion or pneumothorax.  The heart is normal in size.  Visualized osseous structures are within normal limits.  IMPRESSION: Multifocal patchy opacities bilaterally, suspicious for multifocal pneumonia.   Electronically Signed   By: Julian Hy M.D.   On: 03/27/2015 11:49   Ct Angio Chest Pe W/cm &/or Wo Cm  03/27/2015   CLINICAL DATA:  polyuria, esp. At night x ~3 months. She states she was recently dx with type II diabetes; however, has not received any medications for treatment of same. Her skin is normal, warm and dry and she hyperventilates when observed. At times when observed surreptitiously her respirations are normal. Sob with tachycardic progressive over a week  EXAM: CT ANGIOGRAPHY CHEST WITH  CONTRAST  TECHNIQUE: Multidetector CT imaging of the chest was performed using the standard protocol during bolus administration of intravenous contrast. Multiplanar CT image reconstructions and MIPs were obtained to evaluate the vascular anatomy.  CONTRAST:  16mL OMNIPAQUE IOHEXOL 350 MG/ML SOLN  COMPARISON:  None.  FINDINGS: Left arm contrast injection. The SVC is patent. Right ventricle nondilated. Satisfactory opacification of pulmonary arteries noted, and there is no evidence of pulmonary emboli. Patent pulmonary veins bilaterally. Adequate contrast opacification of the thoracic aorta with no evidence of dissection, aneurysm, or stenosis. There is bovine variant brachiocephalic arch anatomy without proximal stenosis.  Trace left pleural effusion. No pericardial effusion. Bilateral hilar, subcarinal, and precarinal adenopathy. Emphysematous changes most marked in the apices. Extensive airspace consolidation throughout both lower lobes left worse than right, with a patchy airspace in extensive alveolar opacities throughout the lingula and  right middle lobe. There peripheral airspace and alveolar opacities in both upper lobes. Innumerable subcentimeter cystic lucencies scattered throughout both lungs primarily peripherally and in the bases. Lumbar spine and sternum intact. Visualized portions of upper abdomen unremarkable.  Review of the MIP images confirms the above findings.  IMPRESSION: 1. Extensive airspace opacities throughout both lungs involving bases worse than apices, left greater than right. Asymmetric edema, pneumonia, and a variety of inflammatory processes can give this appearance. 2. Bilateral hilar and mediastinal adenopathy, possibly reactive but nonspecific. 3. Negative for acute PE or thoracic aortic dissection.   Electronically Signed   By: Lucrezia Europe M.D.   On: 03/27/2015 12:47     ASSESSMENT / PLAN:  PULMONARY OETT 7/9 >> A:Acute hypoxemic resp failure Bilateral PNA ?PCP HIV   P:    Bipap,RR remains high >> proceed with intubation Vent settings ordered - ARDS protocol if high FIO2 needed (was on 55% venti mask)  CXR/ABG  CARDIOVASCULAR CVL none  A: Lactate 2.9.  Severe sepsis P:  Septic shock protocol  CVL placed - check CVP & co-ox x 1, rpt lactate  RENAL A:  Hyponatremia P:   montior  GASTROINTESTINAL A:  No acute issues P:   PPI Monitor NPO  HEMATOLOGIC A:  Mild anemia P:  monitor  INFECTIOUS A:  HIV Bilateral CAP NOS vs PCP P:   BCx2 03/26/14>>> Sputum cx7/9/16>>> DFA for PCP >>  Abx: Rocephin 7/9>> Azithromycin 7/9>> Bactrim 7/9>> ' Add solumedrol 40 q 12  for PCP  ENDOCRINE A:  No acute issues   P:   Monitor   NEUROLOGIC A:  anxiety P:   RASS goal: -1 fent gtt, versed prn   FAMILY  - Updates: pt & husband -husband is aware of HIV diagnosis & pt designated him HCPOA  - Inter-disciplinary family meet or Palliative Care meeting due by:  7/17    TODAY'S SUMMARY:  58 y.o. F with HIV , bilateral PNA /ARDS , severe sepsis, septic shock , rising lactate, acute respiratory failure. Will add empiric PCP therapy while awaiting BAL data, given low CD4 count  The patient is critically ill with multiple organ systems failure and requires high complexity decision making for assessment and support, frequent evaluation and titration of therapies, application of advanced monitoring technologies and extensive interpretation of multiple databases. Critical Care Time devoted to patient care services described in this note independent of APP time is 60 minutes.    03/28/2015, 12:29 AM

## 2015-03-28 NOTE — Progress Notes (Signed)
The Respiratory therapist for tonight told me she could not collect sputum sample for the pneumocystis smear due to the unavailability of a sputum trap container.

## 2015-03-28 NOTE — Progress Notes (Signed)
Quick bedside rounds  - S: RN denies issues  - O: ion fent versed, synch with vent, fio2 50%, 10 peep  PULMONARY  Recent Labs Lab 03/27/15 1944 03/28/15 0145 03/28/15 0310 03/28/15 0319 03/28/15 0456  PHART 7.517* 7.347* 7.205*  --  7.255*  PCO2ART 29.5* 42.0 59.6*  --  49.0*  PO2ART 50.2* 94.6 109*  --  105*  HCO3 23.9 22.4 22.4  --  20.9  TCO2 21.1 20.6 20.3  --  19.7  O2SAT 84.6 95.3 94.6 87.2 95.6    CBC  Recent Labs Lab 03/27/15 1327 03/27/15 1643 03/28/15 0742  HGB 11.2* 12.5 10.2*  HCT 32.4* 35.9* 30.2*  WBC 11.7* 12.4* 13.6*  PLT 258 288 223    COAGULATION  Recent Labs Lab 03/27/15 1730  INR 1.22    CARDIAC  No results for input(s): TROPONINI in the last 168 hours. No results for input(s): PROBNP in the last 168 hours.   CHEMISTRY  Recent Labs Lab 03/27/15 1056 03/27/15 1650 03/28/15 0625  NA 134* 135 136  K 3.8 3.5 3.7  CL 101 101 106  CO2 21* 24 24  GLUCOSE 134* 74 97  BUN 11 11 14   CREATININE 0.72 0.57 0.56  CALCIUM 8.6* 8.1* 7.8*  MG  --  1.7  --   PHOS  --  3.2  --    Estimated Creatinine Clearance: 75.4 mL/min (by C-G formula based on Cr of 0.56).   LIVER  Recent Labs Lab 03/27/15 1730 03/28/15 0625  AST  --  148*  ALT  --  65*  ALKPHOS  --  337*  BILITOT  --  2.1*  PROT  --  5.9*  ALBUMIN  --  1.6*  INR 1.22  --      INFECTIOUS  Recent Labs Lab 03/27/15 1643 03/27/15 1943  LATICACIDVEN 2.6* 2.9*  PROCALCITON 1.83  --      ENDOCRINE CBG (last 3)   Recent Labs  03/27/15 1641 03/28/15 0516 03/28/15 0742  GLUCAP 82 123* 163*         IMAGING x48h  - imaDg Chest 2 View  03/27/2015   CLINICAL DATA:  Cough, upper back pain, HIV  EXAM: CHEST  2 VIEW  COMPARISON:  03/09/2015  FINDINGS: Multifocal patchy opacities bilaterally with a mid/lower lung predominance, worrisome for multifocal pneumonia.  No pleural effusion or pneumothorax.  The heart is normal in size.  Visualized osseous structures are  within normal limits.  IMPRESSION: Multifocal patchy opacities bilaterally, suspicious for multifocal pneumonia.   Electronically Signed   By: Julian Hy M.D.   On: 03/27/2015 11:49   Ct Angio Chest Pe W/cm &/or Wo Cm  03/27/2015   CLINICAL DATA:  polyuria, esp. At night x ~3 months. She states she was recently dx with type II diabetes; however, has not received any medications for treatment of same. Her skin is normal, warm and dry and she hyperventilates when observed. At times when observed surreptitiously her respirations are normal. Sob with tachycardic progressive over a week  EXAM: CT ANGIOGRAPHY CHEST WITH CONTRAST  TECHNIQUE: Multidetector CT imaging of the chest was performed using the standard protocol during bolus administration of intravenous contrast. Multiplanar CT image reconstructions and MIPs were obtained to evaluate the vascular anatomy.  CONTRAST:  58mL OMNIPAQUE IOHEXOL 350 MG/ML SOLN  COMPARISON:  None.  FINDINGS: Left arm contrast injection. The SVC is patent. Right ventricle nondilated. Satisfactory opacification of pulmonary arteries noted, and there is no evidence of pulmonary  emboli. Patent pulmonary veins bilaterally. Adequate contrast opacification of the thoracic aorta with no evidence of dissection, aneurysm, or stenosis. There is bovine variant brachiocephalic arch anatomy without proximal stenosis.  Trace left pleural effusion. No pericardial effusion. Bilateral hilar, subcarinal, and precarinal adenopathy. Emphysematous changes most marked in the apices. Extensive airspace consolidation throughout both lower lobes left worse than right, with a patchy airspace in extensive alveolar opacities throughout the lingula and right middle lobe. There peripheral airspace and alveolar opacities in both upper lobes. Innumerable subcentimeter cystic lucencies scattered throughout both lungs primarily peripherally and in the bases. Lumbar spine and sternum intact. Visualized portions of  upper abdomen unremarkable.  Review of the MIP images confirms the above findings.  IMPRESSION: 1. Extensive airspace opacities throughout both lungs involving bases worse than apices, left greater than right. Asymmetric edema, pneumonia, and a variety of inflammatory processes can give this appearance. 2. Bilateral hilar and mediastinal adenopathy, possibly reactive but nonspecific. 3. Negative for acute PE or thoracic aortic dissection.   Electronically Signed   By: Lucrezia Europe M.D.   On: 03/27/2015 12:47   Dg Chest Port 1 View  03/28/2015   CLINICAL DATA:  Acute respiratory failure with hypoxia. Severe sepsis was septic shock. HIV. On ventilator.  EXAM: PORTABLE CHEST - 1 VIEW  COMPARISON:  03/28/2015  FINDINGS: Support lines and tubes in appropriate position. Diffuse symmetric bilateral airspace disease shows no significant interval change. No evidence of pneumothorax or definite pleural effusion. Heart size remains within normal limits.  IMPRESSION: Diffuse bilateral airspace disease, without significant change.   Electronically Signed   By: Earle Gell M.D.   On: 03/28/2015 07:36   Dg Chest Port 1 View  03/28/2015   CLINICAL DATA:  Encounter for central line placement  EXAM: PORTABLE CHEST - 1 VIEW  COMPARISON:  Chest x-ray from yesterday  FINDINGS: New left IJ central line, tip at the upper SVC. No pneumothorax. New endotracheal tube with tip just below the clavicular heads. A orogastric tube reaches the stomach at least.  Unchanged widespread airspace disease. Normal heart size and stable mediastinal contours. No evidence of effusion.  IMPRESSION: 1. New tubes and central line are in good position. No pneumothorax. 2. Unchanged widespread airspace disease.   Electronically Signed   By: Monte Fantasia M.D.   On: 03/28/2015 02:18    A/p  ARDS Lactic acidosis  P Full vent support Repeat labs     Dr. Brand Males, M.D., [facility].C.P Pulmonary and Critical Care Medicine Staff Physician [facility] Pager: [phone], If no answer or between  15:00h - 7:00h: call [phone]  03/28/2015 1:39 PM

## 2015-03-28 NOTE — Progress Notes (Signed)
eLink Physician-Brief Progress Note Patient Name: Courtney Escobar DOB: 04-07-1956 MRN: [id number]   Date of Service  03/28/2015  HPI/Events of Note  Pt with ARDS  eICU Interventions  Started ARDS protocol and adjusted peep to 10     Intervention Category Major Interventions: Respiratory failure - evaluation and management  Asencion Noble 03/28/2015, 2:20 AM

## 2015-03-28 NOTE — Progress Notes (Signed)
eLink Physician-Brief Progress Note Patient Name: Amirra Herling DOB: Jun 16, 1956 MRN: [id number]   Date of Service  03/28/2015  HPI/Events of Note  resp failure, bilateral ARDS pattern, HIV ?PCP  eICU Interventions  PCCM full note to follow, likely needs Vent. tfr to PCCM primary SVC     Intervention Category Major Interventions: Respiratory failure - evaluation and management  Asencion Noble 03/28/2015, 12:26 AM

## 2015-03-28 NOTE — Progress Notes (Signed)
eLink Physician-Brief Progress Note Patient Name: Jahna Liebert DOB: [birth year] MRN: [id number]   Date of Service  03/28/2015  HPI/Events of Note  pco2 59 on vent ards protocol  eICU Interventions  Change rate to 35 and f/u ABG     Intervention Category Major Interventions: Respiratory failure - evaluation and management  Asencion Noble 03/28/2015, 3:46 AM

## 2015-03-28 NOTE — Procedures (Signed)
Central Venous Catheter Insertion Procedure Note Courtney Escobar [id number] Jan 01, 1956  Procedure: Insertion of Central Venous Catheter Indications: Assessment of intravascular volume and Drug and/or fluid administration  Procedure Details Consent: Risks of procedure as well as the alternatives and risks of each were explained to the (patient/caregiver).  Consent for procedure obtained. Time Out: Verified patient identification, verified procedure, site/side was marked, verified correct patient position, special equipment/implants available, medications/allergies/relevent history reviewed, required imaging and test results available.  Performed  Maximum sterile technique was used including antiseptics, cap, gloves, gown, hand hygiene, mask and sheet. Skin prep: Chlorhexidine; local anesthetic administered A antimicrobial bonded/coated triple lumen catheter was placed in the left internal jugular vein using the Seldinger technique.  Evaluation Blood flow good Complications: No apparent complications Patient did tolerate procedure well. Chest X-ray ordered to verify placement.  CXR: pending.  ALVA,RAKESH V. 03/28/2015, 1:39 AM

## 2015-03-28 NOTE — Progress Notes (Signed)
CRITICAL VALUE ALERT  Critical value received: PCo2 59  Date of notification:  03/28/15  Time of notification: 0300  Critical value read back: Yes  Nurse who received alert: Lilli Light   MD notified (1st page):  MD Joya Gaskins   Time of first page:  0301  MD notified (2nd page):   Time of second page:  Responding MD:  MD Joya Gaskins   Time MD responded:  [phone]

## 2015-03-29 DIAGNOSIS — Z21 Asymptomatic human immunodeficiency virus [HIV] infection status: Secondary | ICD-10-CM

## 2015-03-29 DIAGNOSIS — I959 Hypotension, unspecified: Secondary | ICD-10-CM

## 2015-03-29 DIAGNOSIS — J9601 Acute respiratory failure with hypoxia: Secondary | ICD-10-CM

## 2015-03-29 DIAGNOSIS — E43 Unspecified severe protein-calorie malnutrition: Secondary | ICD-10-CM

## 2015-03-29 DIAGNOSIS — J189 Pneumonia, unspecified organism: Secondary | ICD-10-CM

## 2015-03-29 DIAGNOSIS — R7611 Nonspecific reaction to tuberculin skin test without active tuberculosis: Secondary | ICD-10-CM

## 2015-03-29 DIAGNOSIS — B59 Pneumocystosis: Secondary | ICD-10-CM

## 2015-03-29 DIAGNOSIS — Z9911 Dependence on respirator [ventilator] status: Secondary | ICD-10-CM

## 2015-03-29 LAB — GLUCOSE, CAPILLARY
GLUCOSE-CAPILLARY: 121 mg/dL — AB (ref 65–99)
GLUCOSE-CAPILLARY: 122 mg/dL — AB (ref 65–99)
GLUCOSE-CAPILLARY: 144 mg/dL — AB (ref 65–99)
Glucose-Capillary: 169 mg/dL — ABNORMAL HIGH (ref 65–99)
Glucose-Capillary: 115 mg/dL — ABNORMAL HIGH (ref 65–99)

## 2015-03-29 LAB — BASIC METABOLIC PANEL
Anion gap: 6 (ref 5–15)
BUN: 13 mg/dL (ref 6–20)
CHLORIDE: 105 mmol/L (ref 101–111)
CO2: 23 mmol/L (ref 22–32)
Calcium: 7.5 mg/dL — ABNORMAL LOW (ref 8.9–10.3)
Creatinine, Ser: 0.53 mg/dL (ref 0.44–1.00)
GFR calc Af Amer: >60 mL/min (ref >60)
GFR calc non Af Amer: >60 mL/min (ref >60)
GLUCOSE: 127 mg/dL — AB (ref 65–99)
Potassium: 3.7 mmol/L (ref 3.5–5.1)
Sodium: 134 mmol/L — ABNORMAL LOW (ref 135–145)

## 2015-03-29 LAB — CBC
HCT: 27.3 % — ABNORMAL LOW (ref 36.0–46.0)
HEMOGLOBIN: 9.3 g/dL — AB (ref 12.0–15.0)
MCH: 32.1 pg (ref 26.0–34.0)
MCHC: 34.1 g/dL (ref 30.0–36.0)
MCV: 94.1 fL (ref 78.0–100.0)
Platelets: 216 10*3/uL (ref 150–400)
RBC: 2.9 MIL/uL — ABNORMAL LOW (ref 3.87–5.11)
RDW: 15.4 % (ref 11.5–15.5)
WBC: 11.5 10*3/uL — ABNORMAL HIGH (ref 4.0–10.5)

## 2015-03-29 LAB — LACTIC ACID, PLASMA: Lactic Acid, Venous: 2.8 mmol/L (ref 0.5–2.0)

## 2015-03-29 LAB — T-HELPER CELLS (CD4) COUNT (NOT AT ARMC)
CD4 % Helper T Cell: 11 % — ABNORMAL LOW (ref 33–55)
CD4 T CELL ABS: 150 /uL — AB (ref 400–2700)

## 2015-03-29 LAB — LEGIONELLA ANTIGEN, URINE

## 2015-03-29 LAB — PNEUMOCYSTIS JIROVECI SMEAR BY DFA: PNEUMOCYSTIS JIROVECI AG: NEGATIVE

## 2015-03-29 MED ORDER — VITAL HIGH PROTEIN PO LIQD
1000.0000 mL | ORAL | Status: AC
  Administered 2015-03-29: 20 mL/h
  Filled 2015-03-29: qty 1000

## 2015-03-29 MED ORDER — SODIUM CHLORIDE 0.9 % IV BOLUS (SEPSIS)
1000.0000 mL | Freq: Once | INTRAVENOUS | Status: AC
  Administered 2015-03-29: 1000 mL via INTRAVENOUS

## 2015-03-29 MED ORDER — HEPARIN SODIUM (PORCINE) 5000 UNIT/ML IJ SOLN
5000.0000 [IU] | Freq: Three times a day (TID) | INTRAMUSCULAR | Status: DC
  Administered 2015-03-29 – 2015-04-02 (×14): 5000 [IU] via SUBCUTANEOUS
  Filled 2015-03-29 (×14): qty 1

## 2015-03-29 MED ORDER — PANTOPRAZOLE SODIUM 40 MG PO PACK
40.0000 mg | PACK | ORAL | Status: DC
  Administered 2015-03-29: 40 mg
  Filled 2015-03-29 (×3): qty 20

## 2015-03-29 MED ORDER — ACETAMINOPHEN 160 MG/5ML PO SOLN: 650.0000 mg | Freq: Four times a day (QID) | ORAL | Status: DC | PRN

## 2015-03-29 MED ORDER — INSULIN ASPART 100 UNIT/ML ~~LOC~~ SOLN
0.0000 [IU] | SUBCUTANEOUS | Status: DC
  Administered 2015-03-29: 4 [IU] via SUBCUTANEOUS
  Administered 2015-03-29 – 2015-03-30 (×3): 3 [IU] via SUBCUTANEOUS
  Administered 2015-03-30: 4 [IU] via SUBCUTANEOUS

## 2015-03-29 MED ORDER — IPRATROPIUM-ALBUTEROL 0.5-2.5 (3) MG/3ML IN SOLN
3.0000 mL | RESPIRATORY_TRACT | Status: DC | PRN
Start: 2015-03-29 — End: 2015-04-02

## 2015-03-29 MED ORDER — VITAL AF 1.2 CAL PO LIQD
1000.0000 mL | ORAL | Status: DC
  Administered 2015-03-29 (×2): 40 mL
  Filled 2015-03-29 (×2): qty 1000

## 2015-03-29 NOTE — Progress Notes (Signed)
Attempted to obtain sputum via a mucous specimen trap with no success. Patient has no endotracheal secretions when deep suctioned. Oral secretions, however, are copious at times. Sputum trap remains inline with suction for further attempts to collect.

## 2015-03-29 NOTE — Progress Notes (Signed)
PULMONARY / CRITICAL CARE MEDICINE   Name: Courtney Escobar MRN: [id number] DOB: 1956/07/10    ADMISSION DATE:  03/27/2015 CONSULTATION DATE:  03/29/2015  REFERRING MD :  Charlies Silvers TRH  CHIEF COMPLAINT:   Resp distress, ARDS, poss PCP PNA  INITIAL PRESENTATION:  59 y.o. female former smoker with hx of HIV on ART admitted 7/9 to [facility] from ED with progressive dyspnea over months to acutely worsening and PCCM consulted early AM 03/29/2015 for bilateral progressive infiltrate. PMH - RA , chronic hepatitis with neg liver bx  STUDIES:  7/9 CT Chest:  Bilateral infiltrates  SIGNIFICANT EVENTS: 6/28 ID consult 7/20 intubated  SUBJECTIVE:  Remains on PEEP 8.  VITAL SIGNS: Temp:  [97.9 F (36.6 C)-99.5 F (37.5 C)] 99.5 F (37.5 C) (07/11 0800) Pulse Rate:  [101-115] 104 (07/11 0700) Resp:  [17-38] 35 (07/11 0800) BP: (81-125)/(56-85) 103/59 mmHg (07/11 0800) SpO2:  [93 %-100 %] 100 % (07/11 0700) FiO2 (%):  [40 %-60 %] 40 % (07/11 0806) HEMODYNAMICS: CVP:  [8 mmHg-20 mmHg] 8 mmHg VENTILATOR SETTINGS: Vent Mode:  [-] PRVC FiO2 (%):  [40 %-60 %] 40 % Set Rate:  [35 bmp] 35 bmp Vt Set:  [370 mL] 370 mL PEEP:  [8 cmH20-10 cmH20] 8 cmH20 Plateau Pressure:  [21 cmH20-24 cmH20] 21 cmH20 INTAKE / OUTPUT:  Intake/Output Summary (Last 24 hours) at 03/29/15 0848 Last data filed at 03/29/15 0800  Gross per 24 hour  Intake 5172.42 ml  Output   1880 ml  Net 3292.42 ml    PHYSICAL EXAMINATION: General: sedated HEENT: ETT in place Lungs: b/l crackles, no wheeze Cardiovascular: regular, no murmur Abdomen: soft, non tender Musculoskeletal: No edema Neuro: RASS -1, follows commands, moves all extremities Skin: purple macules on arms   LABS:  CBC  Recent Labs Lab 03/28/15 0742 03/28/15 1430 03/29/15 0234  WBC 13.6* 10.6* 11.5*  HGB 10.2* 9.7* 9.3*  HCT 30.2* 29.1* 27.3*  PLT 223 221 216   Coag's  Recent Labs Lab 03/27/15 1730  APTT 24  INR 1.22   BMET  Recent  Labs Lab 03/28/15 0625 03/28/15 1430 03/29/15 0234  NA 136 133* 134*  K 3.7 4.0 3.7  CL 106 105 105  CO2 24 22 23   BUN 14 13 13   CREATININE 0.56 0.52 0.53  GLUCOSE 97 189* 127*   Electrolytes  Recent Labs Lab 03/27/15 1650 03/28/15 0625 03/28/15 1430 03/29/15 0234  CALCIUM 8.1* 7.8* 7.6* 7.5*  MG 1.7  --  1.8  --   PHOS 3.2  --   --   --    Sepsis Markers  Recent Labs Lab 03/27/15 1643 03/27/15 1943 03/28/15 1430 03/29/15 0234  LATICACIDVEN 2.6* 2.9* 3.5* 2.8*  PROCALCITON 1.83  --   --   --    ABG  Recent Labs Lab 03/28/15 0145 03/28/15 0310 03/28/15 0456  PHART 7.347* 7.205* 7.255*  PCO2ART 42.0 59.6* 49.0*  PO2ART 94.6 109* 105*   Liver Enzymes  Recent Labs Lab 03/28/15 0625  AST 148*  ALT 65*  ALKPHOS 337*  BILITOT 2.1*  ALBUMIN 1.6*   Glucose  Recent Labs Lab 03/27/15 1030 03/27/15 1641 03/28/15 0516 03/28/15 0742 03/29/15 0021  GLUCAP 82 82 123* 163* 144*    Imaging Dg Chest 2 View  03/27/2015   CLINICAL DATA:  Cough, upper back pain, HIV  EXAM: CHEST  2 VIEW  COMPARISON:  03/09/2015  FINDINGS: Multifocal patchy opacities bilaterally with a mid/lower lung predominance, worrisome for multifocal pneumonia.  No pleural effusion or pneumothorax.  The heart is normal in size.  Visualized osseous structures are within normal limits.  IMPRESSION: Multifocal patchy opacities bilaterally, suspicious for multifocal pneumonia.   Electronically Signed   By: Julian Hy M.D.   On: 03/27/2015 11:49   Ct Angio Chest Pe W/cm &/or Wo Cm  03/27/2015   CLINICAL DATA:  polyuria, esp. At night x ~3 months. She states she was recently dx with type II diabetes; however, has not received any medications for treatment of same. Her skin is normal, warm and dry and she hyperventilates when observed. At times when observed surreptitiously her respirations are normal. Sob with tachycardic progressive over a week  EXAM: CT ANGIOGRAPHY CHEST WITH CONTRAST   TECHNIQUE: Multidetector CT imaging of the chest was performed using the standard protocol during bolus administration of intravenous contrast. Multiplanar CT image reconstructions and MIPs were obtained to evaluate the vascular anatomy.  CONTRAST:  62mL OMNIPAQUE IOHEXOL 350 MG/ML SOLN  COMPARISON:  None.  FINDINGS: Left arm contrast injection. The SVC is patent. Right ventricle nondilated. Satisfactory opacification of pulmonary arteries noted, and there is no evidence of pulmonary emboli. Patent pulmonary veins bilaterally. Adequate contrast opacification of the thoracic aorta with no evidence of dissection, aneurysm, or stenosis. There is bovine variant brachiocephalic arch anatomy without proximal stenosis.  Trace left pleural effusion. No pericardial effusion. Bilateral hilar, subcarinal, and precarinal adenopathy. Emphysematous changes most marked in the apices. Extensive airspace consolidation throughout both lower lobes left worse than right, with a patchy airspace in extensive alveolar opacities throughout the lingula and right middle lobe. There peripheral airspace and alveolar opacities in both upper lobes. Innumerable subcentimeter cystic lucencies scattered throughout both lungs primarily peripherally and in the bases. Lumbar spine and sternum intact. Visualized portions of upper abdomen unremarkable.  Review of the MIP images confirms the above findings.  IMPRESSION: 1. Extensive airspace opacities throughout both lungs involving bases worse than apices, left greater than right. Asymmetric edema, pneumonia, and a variety of inflammatory processes can give this appearance. 2. Bilateral hilar and mediastinal adenopathy, possibly reactive but nonspecific. 3. Negative for acute PE or thoracic aortic dissection.   Electronically Signed   By: Lucrezia Europe M.D.   On: 03/27/2015 12:47   Dg Chest Port 1 View  03/28/2015   CLINICAL DATA:  Acute respiratory failure with hypoxia. Severe sepsis was septic shock.  HIV. On ventilator.  EXAM: PORTABLE CHEST - 1 VIEW  COMPARISON:  03/28/2015  FINDINGS: Support lines and tubes in appropriate position. Diffuse symmetric bilateral airspace disease shows no significant interval change. No evidence of pneumothorax or definite pleural effusion. Heart size remains within normal limits.  IMPRESSION: Diffuse bilateral airspace disease, without significant change.   Electronically Signed   By: Earle Gell M.D.   On: 03/28/2015 07:36   Dg Chest Port 1 View  03/28/2015   CLINICAL DATA:  Encounter for central line placement  EXAM: PORTABLE CHEST - 1 VIEW  COMPARISON:  Chest x-ray from yesterday  FINDINGS: New left IJ central line, tip at the upper SVC. No pneumothorax. New endotracheal tube with tip just below the clavicular heads. A orogastric tube reaches the stomach at least.  Unchanged widespread airspace disease. Normal heart size and stable mediastinal contours. No evidence of effusion.  IMPRESSION: 1. New tubes and central line are in good position. No pneumothorax. 2. Unchanged widespread airspace disease.   Electronically Signed   By: Monte Fantasia M.D.   On: 03/28/2015 02:18  ASSESSMENT / PLAN:  PULMONARY ETT 7/9 >> A: Acute hypoxemic resp failure 2nd to b/l pulmonary infiltrates in setting of HIV/AIDS. ARDS. P:   ARDS protocol started 7/09 F/u CXR, ABG Continue solumedrol with concern for PCP Changed BDs to prn  CARDIOVASCULAR Lt IJ CVL 7/10 >> A:  Severe sepsis. P:  Monitor hemodynamics  RENAL A:  Lactic acidosis. P:   F/u lactic acid  GASTROINTESTINAL A:   Nutrition. P:   Tube feeds on vent Protonix for SUP  HEMATOLOGIC A:   Anemia of critical illness and chronic disease. P:  F/u CBC SCD, SQ heparin for DVT prophylaxis  INFECTIOUS A:   B/l pulmonary infiltrates in setting of HIV/AIDS >> CD4 less than 10 from 03/16/15; influenza PCR negative 7/09. P:   BCx2 03/26/14>>> Sputum cx7/9/16>>> DFA for PCP >> Legionella Ag 7/10  >>  Rocephin 7/9>> Azithromycin 7/9>> Bactrim 7/9>>  Antiretroviral therapy per ID  ENDOCRINE A:   Steroid induced hyperglycemia. P:   SSI  NEUROLOGIC A:   Acute metabolic encephalopathy.  P:   RASS goal: -1  CC time 40 minutes.  Chesley Mires, MD [facility] Pulmonary/Critical Care 03/29/2015, 8:52 AM Pager:  [phone] After 3pm call: [phone]

## 2015-03-29 NOTE — Progress Notes (Signed)
Did initial review for hospitalization starting 7/9.  Also initial review for critical care level of care which started 7/10

## 2015-03-29 NOTE — Progress Notes (Addendum)
INFECTIOUS DISEASE PROGRESS NOTE  ID: Courtney Escobar is a 59 y.o. female with  Principal Problem:   Severe sepsis with septic shock Active Problems:   HIV (human immunodeficiency virus infection)   CAP (community acquired pneumonia)   Leukocytosis   Acute respiratory failure with hypoxia  Subjective: Sedated on vent.   Abtx:  Anti-infectives    Start     Dose/Rate Route Frequency Ordered Stop   03/28/15 2000  emtricitabine-tenofovir (TRUVADA) 200-300 MG per tablet 1 tablet     1 tablet Oral Daily 03/28/15 1651     03/28/15 1500  azithromycin (ZITHROMAX) 500 mg in dextrose 5 % 250 mL IVPB     500 mg 250 mL/hr over 60 Minutes Intravenous Every 24 hours 03/27/15 1647     03/28/15 1000  sulfamethoxazole-trimethoprim (BACTRIM DS,SEPTRA DS) 800-160 MG per tablet 1 tablet  Status:  Discontinued     1 tablet Oral Daily 03/27/15 1728 03/28/15 0047   03/28/15 0100  sulfamethoxazole-trimethoprim (BACTRIM) 420 mg of trimethoprim in dextrose 5 % 500 mL IVPB     420 mg of trimethoprim 350.8 mL/hr over 90 Minutes Intravenous 3 times per day 03/28/15 0055     03/27/15 2000  cefTRIAXone (ROCEPHIN) 1 g in dextrose 5 % 50 mL IVPB - Premix     1 g 100 mL/hr over 30 Minutes Intravenous Every 24 hours 03/27/15 1647     03/27/15 2000  dolutegravir (TIVICAY) tablet 50 mg     50 mg Oral Daily 03/27/15 1753     03/27/15 1800  dolutegravir (TIVICAY) tablet 50 mg  Status:  Discontinued     50 mg Oral Daily 03/27/15 1706 03/27/15 1753   03/27/15 1800  emtricitabine-tenofovir AF (DESCOVY) 200-25 MG per tablet 1 tablet  Status:  Discontinued     1 tablet Oral Daily 03/27/15 1706 03/27/15 1720   03/27/15 1800  emtricitabine-tenofovir AF (DESCOVY) 200-25 MG per tablet 1 tablet  Status:  Discontinued     1 tablet Oral Daily 03/27/15 1720 03/28/15 1651   03/27/15 1645  cefTRIAXone (ROCEPHIN) 1 g in dextrose 5 % 50 mL IVPB - Premix  Status:  Discontinued     1 g 100 mL/hr over 30 Minutes Intravenous  Once  03/27/15 1642 03/27/15 1644   03/27/15 1645  azithromycin (ZITHROMAX) 500 mg in dextrose 5 % 250 mL IVPB  Status:  Discontinued     500 mg 250 mL/hr over 60 Minutes Intravenous  Once 03/27/15 1642 03/27/15 1644   03/27/15 1430  cefTRIAXone (ROCEPHIN) injection 250 mg     250 mg Intramuscular  Once 03/27/15 1415 03/27/15 1515   03/27/15 1430  azithromycin (ZITHROMAX) 500 mg in dextrose 5 % 250 mL IVPB     500 mg 250 mL/hr over 60 Minutes Intravenous  Once 03/27/15 1415 03/27/15 1614   03/27/15 1430  sulfamethoxazole-trimethoprim (BACTRIM DS,SEPTRA DS) 800-160 MG per tablet 1 tablet     1 tablet Oral  Once 03/27/15 1415 03/27/15 1514      Medications:  Scheduled: . antiseptic oral rinse  7 mL Mouth Rinse QID  . azithromycin  500 mg Intravenous Q24H  . cefTRIAXone (ROCEPHIN)  IV  1 g Intravenous Q24H  . chlorhexidine  15 mL Mouth Rinse BID  . dolutegravir  50 mg Oral Daily  . emtricitabine-tenofovir  1 tablet Oral Q2000  . fentaNYL (SUBLIMAZE) injection  50 mcg Intravenous Once  . heparin subcutaneous  5,000 Units Subcutaneous 3 times per day  . insulin  aspart  0-20 Units Subcutaneous 6 times per day  . methylPREDNISolone (SOLU-MEDROL) injection  40 mg Intravenous BID  . pantoprazole sodium  40 mg Per Tube Q24H  . sodium chloride  1,000 mL Intravenous Once  . sodium chloride  3 mL Intravenous Q12H  . sulfamethoxazole-trimethoprim  420 mg of trimethoprim Intravenous 3 times per day    Objective: Vital signs in last 24 hours: Temp:  [98.1 F (36.7 C)-99.7 F (37.6 C)] 99.7 F (37.6 C) (07/11 1400) Pulse Rate:  [99-115] 99 (07/11 1400) Resp:  [17-38] 35 (07/11 1400) BP: (81-125)/(55-85) 95/57 mmHg (07/11 1400) SpO2:  [100 %] 100 % (07/11 1400) FiO2 (%):  [40 %-50 %] 40 % (07/11 1159)   General appearance: sedated, on vent Resp: rhonchi bilaterally Cardio: regular rate and rhythm GI: normal findings: soft, non-tender and abnormal findings:  hypoactive bowel  sounds Extremities: edema none  Lab Results  Recent Labs  03/28/15 1430 03/29/15 0234  WBC 10.6* 11.5*  HGB 9.7* 9.3*  HCT 29.1* 27.3*  NA 133* 134*  K 4.0 3.7  CL 105 105  CO2 22 23  BUN 13 13  CREATININE 0.52 0.53   Liver Panel  Recent Labs  03/28/15 0625  PROT 5.9*  ALBUMIN 1.6*  AST 148*  ALT 65*  ALKPHOS 337*  BILITOT 2.1*   Sedimentation Rate No results for input(s): ESRSEDRATE in the last 72 hours. C-Reactive Protein No results for input(s): CRP in the last 72 hours.  Microbiology: Recent Results (from the past 240 hour(s))  MRSA PCR Screening     Status: None   Collection Time: 03/27/15  2:00 PM  Result Value Ref Range Status   MRSA by PCR NEGATIVE NEGATIVE Final    Comment:        The GeneXpert MRSA Assay (FDA approved for NASAL specimens only), is one component of a comprehensive MRSA colonization surveillance program. It is not intended to diagnose MRSA infection nor to guide or monitor treatment for MRSA infections.   Culture, blood (x 2)     Status: None (Preliminary result)   Collection Time: 03/27/15  5:09 PM  Result Value Ref Range Status   Specimen Description BLOOD RIGHT ARM  Final   Special Requests BOTTLES DRAWN AEROBIC AND ANAEROBIC 10CC  Final   Culture   Final    NO GROWTH 1 DAY Performed at [facility]    Report Status PENDING  Incomplete  Culture, blood (x 2)     Status: None (Preliminary result)   Collection Time: 03/27/15  5:21 PM  Result Value Ref Range Status   Specimen Description BLOOD RIGHT ARM  Final   Special Requests BOTTLES DRAWN AEROBIC AND ANAEROBIC 10CC  Final   Culture   Final    NO GROWTH 1 DAY Performed at [facility]    Report Status PENDING  Incomplete  Culture, respiratory (NON-Expectorated)     Status: None (Preliminary result)   Collection Time: 03/28/15  1:15 AM  Result Value Ref Range Status   Specimen Description TRACHEAL ASPIRATE  Final   Special Requests NONE  Final   Gram  Stain   Final    NO WBC SEEN NO SQUAMOUS EPITHELIAL CELLS SEEN NO ORGANISMS SEEN Performed at [facility]    Culture   Final    NO GROWTH 1 DAY Performed at [facility]    Report Status PENDING  Incomplete  Pneumocystis smear by DFA     Status: None  Collection Time: 03/29/15  8:30 AM  Result Value Ref Range Status   Specimen Source-PJSRC TRACHEAL ASPIRATE  Final   Pneumocystis jiroveci Ag NEGATIVE  Final    Comment: Performed at [facility]    Studies/Results: Dg Chest Port 1 View  03/28/2015   CLINICAL DATA:  Acute respiratory failure with hypoxia. Severe sepsis was septic shock. HIV. On ventilator.  EXAM: PORTABLE CHEST - 1 VIEW  COMPARISON:  03/28/2015  FINDINGS: Support lines and tubes in appropriate position. Diffuse symmetric bilateral airspace disease shows no significant interval change. No evidence of pneumothorax or definite pleural effusion. Heart size remains within normal limits.  IMPRESSION: Diffuse bilateral airspace disease, without significant change.   Electronically Signed   By: Earle Gell M.D.   On: 03/28/2015 07:36   Dg Chest Port 1 View  03/28/2015   CLINICAL DATA:  Encounter for central line placement  EXAM: PORTABLE CHEST - 1 VIEW  COMPARISON:  Chest x-ray from yesterday  FINDINGS: New left IJ central line, tip at the upper SVC. No pneumothorax. New endotracheal tube with tip just below the clavicular heads. A orogastric tube reaches the stomach at least.  Unchanged widespread airspace disease. Normal heart size and stable mediastinal contours. No evidence of effusion.  IMPRESSION: 1. New tubes and central line are in good position. No pneumothorax. 2. Unchanged widespread airspace disease.   Electronically Signed   By: Monte Fantasia M.D.   On: 03/28/2015 02:18     Assessment/Plan: AIDS VDRF ?IRIS, PCP Hypotension Protein Calorie malnutrition, severe LTBI, prev treated 2001  Total days of antibiotics: 3  ceftriaxone/azithro,  bactrim/ steroids DTGV/TRV  Suspect she has immune reconstitution syndrome from being started on ART agree with steroids despite PCP (-).  Etiology of hypotension not clear. Consider repeat troponins Appreciate CCM, nutrition eval Stop isolation, flu negative Await resp cx (was AFB sent?). AFB BCx pending.  pcp pcr -, suspect we can stop bactrim         Bobby Rumpf Infectious Diseases (pager) [phone] [email or web address] 03/29/2015, 4:10 PM  LOS: 2 days

## 2015-03-29 NOTE — Progress Notes (Signed)
Initial Nutrition Assessment  INTERVENTION:  Tube feeding: Initiate Vital AF 1.2 @ 20 ml/hr via OGT and increase by 10 ml every 4 hours to goal rate of 60 ml/hr.   Tube feeding regimen provides 1728 kcal (105% of needs), 108 grams of protein, and 1166 ml of H2O.    NUTRITION DIAGNOSIS:  Inadequate oral intake related to inability to eat as evidenced by NPO status.  GOAL:  Patient will meet greater than or equal to 90% of their needs  MONITOR:  Vent status, TF tolerance, Weight trends, Labs, Skin  REASON FOR ASSESSMENT:  Ventilator, Consult Enteral/tube feeding initiation and management  ASSESSMENT: 59 y.o.F HIV on ART adm PM 7/9 to [facility] from ED with progressive dyspnea over months to acutely worsening and PCCM consulted early AM 03/28/2015 for bilateral progressive infiltrate.  Pt on vent with Vital High Protein tube feeding infusing via OGT at 20 ml/hr. Per weight history, pt has lost 5 lbs in the past couple weeks though, pt appears well-nourished upon physical exam.   Patient is currently intubated on ventilator support MV: 12.5 L/min Temp (24hrs), Avg:98.6 F (37 C), Min:98.1 F (36.7 C), Max:99.5 F (37.5 C)  Propofol: none   Labs: low hemoglobin, low sodium, low calcium   Height:  Ht Readings from Last 1 Encounters:  03/27/15 5\' 7"  (1.702 m)    Weight:  Wt Readings from Last 1 Encounters:  03/27/15 140 lb 14 oz (63.9 kg)    Ideal Body Weight:  61.4 kg  Wt Readings from Last 10 Encounters:  03/27/15 140 lb 14 oz (63.9 kg)  03/16/15 145 lb (65.772 kg)  05/22/12 169 lb 9.6 oz (76.93 kg)  10/03/10 180 lb (81.647 kg)  02/02/10 176 lb 1.9 oz (79.887 kg)    BMI:  Body mass index is 22.06 kg/(m^2).  Estimated Nutritional Needs:  Kcal:  1648  Protein:  95-115 grams  Fluid:  2.1 L/day  Skin:  Reviewed, no issues  Diet Order:  Diet NPO time specified  EDUCATION NEEDS:  No education needs identified at this time   Intake/Output Summary  (Last 24 hours) at 03/29/15 1345 Last data filed at 03/29/15 1300  Gross per 24 hour  Intake 6010.17 ml  Output   2205 ml  Net 3805.17 ml    Last BM:  PTA  Pryor Ochoa RD, LDN Inpatient Clinical Dietitian Pager: [phone] After Hours Pager: [phone]

## 2015-03-30 ENCOUNTER — Inpatient Hospital Stay (HOSPITAL_COMMUNITY): Payer: 59

## 2015-03-30 DIAGNOSIS — D893 Immune reconstitution syndrome: Secondary | ICD-10-CM

## 2015-03-30 DIAGNOSIS — F419 Anxiety disorder, unspecified: Secondary | ICD-10-CM

## 2015-03-30 DIAGNOSIS — B199 Unspecified viral hepatitis without hepatic coma: Secondary | ICD-10-CM

## 2015-03-30 DIAGNOSIS — R0682 Tachypnea, not elsewhere classified: Secondary | ICD-10-CM

## 2015-03-30 DIAGNOSIS — R000 Tachycardia, unspecified: Secondary | ICD-10-CM

## 2015-03-30 DIAGNOSIS — B20 Human immunodeficiency virus [HIV] disease: Principal | ICD-10-CM

## 2015-03-30 LAB — GLUCOSE, CAPILLARY
GLUCOSE-CAPILLARY: 122 mg/dL — AB (ref 65–99)
GLUCOSE-CAPILLARY: 135 mg/dL — AB (ref 65–99)
GLUCOSE-CAPILLARY: 184 mg/dL — AB (ref 65–99)
Glucose-Capillary: 155 mg/dL — ABNORMAL HIGH (ref 65–99)
Glucose-Capillary: 120 mg/dL — ABNORMAL HIGH (ref 65–99)
Glucose-Capillary: 156 mg/dL — ABNORMAL HIGH (ref 65–99)

## 2015-03-30 LAB — BLOOD GAS, ARTERIAL
Acid-base deficit: 2.1 mmol/L — ABNORMAL HIGH (ref 0.0–2.0)
BICARBONATE: 22.3 meq/L (ref 20.0–24.0)
Drawn by: 345601
FIO2: 0.4 %
MECHVT: 370 mL
O2 Saturation: 92.3 %
PEEP: 5 cmH2O
PH ART: 7.362 (ref 7.350–7.450)
PO2 ART: 73.5 mmHg — AB (ref 80.0–100.0)
Patient temperature: 100
RATE: 35 resp/min
TCO2: 21.2 mmol/L (ref 0–100)
pCO2 arterial: 40.7 mmHg (ref 35.0–45.0)

## 2015-03-30 LAB — MAGNESIUM: Magnesium: 1.9 mg/dL (ref 1.7–2.4)

## 2015-03-30 LAB — CBC
HEMATOCRIT: 24.2 % — AB (ref 36.0–46.0)
Hemoglobin: 8.2 g/dL — ABNORMAL LOW (ref 12.0–15.0)
MCH: 32 pg (ref 26.0–34.0)
MCHC: 33.9 g/dL (ref 30.0–36.0)
MCV: 94.5 fL (ref 78.0–100.0)
Platelets: 194 10*3/uL (ref 150–400)
RBC: 2.56 MIL/uL — ABNORMAL LOW (ref 3.87–5.11)
RDW: 15.8 % — ABNORMAL HIGH (ref 11.5–15.5)
WBC: 9.5 10*3/uL (ref 4.0–10.5)

## 2015-03-30 LAB — COMPREHENSIVE METABOLIC PANEL
ALBUMIN: 1.3 g/dL — AB (ref 3.5–5.0)
ALT: 303 U/L — ABNORMAL HIGH (ref 14–54)
ANION GAP: 5 (ref 5–15)
AST: 605 U/L — ABNORMAL HIGH (ref 15–41)
Alkaline Phosphatase: 504 U/L — ABNORMAL HIGH (ref 38–126)
BILIRUBIN TOTAL: 1 mg/dL (ref 0.3–1.2)
BUN: 16 mg/dL (ref 6–20)
CHLORIDE: 108 mmol/L (ref 101–111)
CO2: 23 mmol/L (ref 22–32)
Calcium: 7.2 mg/dL — ABNORMAL LOW (ref 8.9–10.3)
Creatinine, Ser: 0.55 mg/dL (ref 0.44–1.00)
Glucose, Bld: 126 mg/dL — ABNORMAL HIGH (ref 65–99)
Potassium: 4.1 mmol/L (ref 3.5–5.1)
Sodium: 136 mmol/L (ref 135–145)
Total Protein: 5.2 g/dL — ABNORMAL LOW (ref 6.5–8.1)

## 2015-03-30 LAB — PHOSPHORUS: PHOSPHORUS: 1.7 mg/dL — AB (ref 2.5–4.6)

## 2015-03-30 LAB — CULTURE, RESPIRATORY
Culture: NO GROWTH
GRAM STAIN: NONE SEEN

## 2015-03-30 LAB — LACTIC ACID, PLASMA: Lactic Acid, Venous: 1.9 mmol/L (ref 0.5–2.0)

## 2015-03-30 MED ORDER — PANTOPRAZOLE SODIUM 40 MG IV SOLR
40.0000 mg | Freq: Every day | INTRAVENOUS | Status: DC
  Administered 2015-03-30 – 2015-03-31 (×2): 40 mg via INTRAVENOUS
  Filled 2015-03-30 (×2): qty 40

## 2015-03-30 MED ORDER — HALOPERIDOL LACTATE 5 MG/ML IJ SOLN
1.0000 mg | INTRAMUSCULAR | Status: DC | PRN
  Administered 2015-03-30 – 2015-03-31 (×3): 4 mg via INTRAVENOUS
  Filled 2015-03-30 (×3): qty 1

## 2015-03-30 MED ORDER — HYDROMORPHONE HCL 1 MG/ML IJ SOLN
1.0000 mg | INTRAMUSCULAR | Status: DC | PRN
  Administered 2015-03-30 – 2015-04-02 (×7): 1 mg via INTRAVENOUS
  Filled 2015-03-30 (×8): qty 1

## 2015-03-30 MED ORDER — PANTOPRAZOLE SODIUM 40 MG PO TBEC: 40.0000 mg | DELAYED_RELEASE_TABLET | Freq: Every day | ORAL | Status: DC

## 2015-03-30 MED ORDER — INSULIN ASPART 100 UNIT/ML ~~LOC~~ SOLN
0.0000 [IU] | Freq: Three times a day (TID) | SUBCUTANEOUS | Status: DC
  Administered 2015-03-30 – 2015-03-31 (×5): 4 [IU] via SUBCUTANEOUS

## 2015-03-30 MED ORDER — ACETAMINOPHEN 325 MG PO TABS: 650.0000 mg | ORAL_TABLET | Freq: Four times a day (QID) | ORAL | Status: DC | PRN

## 2015-03-30 MED ORDER — FENTANYL CITRATE (PF) 100 MCG/2ML IJ SOLN
50.0000 ug | INTRAMUSCULAR | Status: DC | PRN
  Administered 2015-03-30 – 2015-03-31 (×3): 50 ug via INTRAVENOUS
  Filled 2015-03-30 (×4): qty 2

## 2015-03-30 NOTE — Procedures (Signed)
Extubation Procedure Note  Patient Details:   Name: Courtney Escobar DOB: 05-19-1956 MRN: [id number]   Airway Documentation:  Airway 7.5 mm (Active)  Secured at (cm) 23 cm 03/30/2015  8:07 AM  Measured From Lips 03/30/2015  8:07 AM  Secured Location Left 03/30/2015  8:07 AM  Secured By [employer] 03/30/2015  8:07 AM  Tube Holder Repositioned Yes 03/30/2015  8:07 AM  Cuff Pressure (cm H2O) 22 cm H2O 03/29/2015  7:41 PM  Site Condition Dry 03/30/2015  8:07 AM    Evaluation  O2 sats: stable throughout Complications: No apparent complications Patient did tolerate procedure well. Bilateral Breath Sounds: Rhonchi Suctioning: Oral, Airway No   Patient vitals remained stable, positive cuff leak, RT will continue to monitor.  Mingo Amber Cecile Guevara 03/30/2015, 8:24 AM

## 2015-03-30 NOTE — Progress Notes (Addendum)
PULMONARY / CRITICAL CARE MEDICINE   Name: Courtney Escobar MRN: [id number] DOB: 06/06/1956    ADMISSION DATE:  03/27/2015 CONSULTATION DATE:  03/30/2015  REFERRING MD :  Charlies Silvers TRH  CHIEF COMPLAINT:   Resp distress, ARDS, poss PCP PNA  INITIAL PRESENTATION:  59 y.o. female former smoker with hx of HIV on ART admitted 7/9 to [facility] from ED with progressive dyspnea over months to acutely worsening and PCCM consulted early AM 03/30/2015 for bilateral progressive infiltrate. PMH - RA , chronic hepatitis with neg liver bx  STUDIES:  7/9 CT Chest >> Bilateral infiltrates  SIGNIFICANT EVENTS: 6/28 ID consult 7/10 intubated, ARDS protocol 7/11 Off pressors  SUBJECTIVE:  BP better after fluid boluses.  PEEP down to 5.  Tolerating pressure support.  VITAL SIGNS: Temp:  [99.1 F (37.3 C)-100 F (37.8 C)] 99.9 F (37.7 C) (07/12 0600) Pulse Rate:  [84-108] 85 (07/12 0600) Resp:  [25-35] 35 (07/12 0600) BP: (87-108)/(47-64) 99/61 mmHg (07/12 0600) SpO2:  [89 %-100 %] 100 % (07/12 0600) FiO2 (%):  [40 %] 40 % (07/12 0429) Weight:  [153 lb 7 oz (69.6 kg)] 153 lb 7 oz (69.6 kg) (07/12 0449) VENTILATOR SETTINGS: Vent Mode:  [-] PRVC FiO2 (%):  [40 %] 40 % Set Rate:  [35 bmp] 35 bmp Vt Set:  [370 mL] 370 mL PEEP:  [5 cmH20-8 cmH20] 5 cmH20 Plateau Pressure:  [19 cmH20-21 cmH20] 19 cmH20 INTAKE / OUTPUT:  Intake/Output Summary (Last 24 hours) at 03/30/15 0753 Last data filed at 03/30/15 0500  Gross per 24 hour  Intake 5807.26 ml  Output   1865 ml  Net 3942.26 ml    PHYSICAL EXAMINATION: General: sedated HEENT: ETT in place Lungs: better air movement, no wheeze, faint b/l crackles Cardiovascular: regular, no murmur Abdomen: soft, non tender Musculoskeletal: No edema Neuro: RASS -1, follows commands, moves all extremities Skin: purple macules on arms   LABS:  CBC  Recent Labs Lab 03/28/15 1430 03/29/15 0234 03/30/15 0355  WBC 10.6* 11.5* 9.5  HGB 9.7* 9.3* 8.2*  HCT  29.1* 27.3* 24.2*  PLT 221 216 194   Coag's  Recent Labs Lab 03/27/15 1730  APTT 24  INR 1.22   BMET  Recent Labs Lab 03/28/15 1430 03/29/15 0234 03/30/15 0355  NA 133* 134* 136  K 4.0 3.7 4.1  CL 105 105 108  CO2 22 23 23   BUN 13 13 16   CREATININE 0.52 0.53 0.55  GLUCOSE 189* 127* 126*   Electrolytes  Recent Labs Lab 03/27/15 1650  03/28/15 1430 03/29/15 0234 03/30/15 0355  CALCIUM 8.1*  < > 7.6* 7.5* 7.2*  MG 1.7  --  1.8  --  1.9  PHOS 3.2  --   --   --  1.7*  < > = values in this interval not displayed. Sepsis Markers  Recent Labs Lab 03/27/15 1643  03/28/15 1430 03/29/15 0234 03/30/15 0355  LATICACIDVEN 2.6*  < > 3.5* 2.8* 1.9  PROCALCITON 1.83  --   --   --   --   < > = values in this interval not displayed. ABG  Recent Labs Lab 03/28/15 0310 03/28/15 0456 03/30/15 0434  PHART 7.205* 7.255* 7.362  PCO2ART 59.6* 49.0* 40.7  PO2ART 109* 105* 73.5*   Liver Enzymes  Recent Labs Lab 03/28/15 0625 03/30/15 0355  AST 148* 605*  ALT 65* 303*  ALKPHOS 337* 504*  BILITOT 2.1* 1.0  ALBUMIN 1.6* 1.3*   Glucose  Recent Labs Lab 03/29/15 0837  03/29/15 1122 03/29/15 1645 03/29/15 1953 03/30/15 0009 03/30/15 0416  GLUCAP 169* 115* 121* 122* 184* 135*    Imaging Dg Chest Port 1 View  03/30/2015   CLINICAL DATA:  ARDS.  EXAM: PORTABLE CHEST - 1 VIEW  FINDINGS: Endotracheal tube, NG tube, left IJ line in stable position. Unchanged bilateral diffuse airspace disease. Heart size normal. Tiny left pleural effusion cannot be excluded. No pneumothorax.  IMPRESSION: 1. Lines and tubes in stable position. 2. Persistent unchanged bilateral diffuse airspace disease .   Electronically Signed   By: Marcello Moores  Register   On: 03/30/2015 07:06      ASSESSMENT / PLAN:  PULMONARY ETT 7/10 >> A: Acute hypoxemic resp failure 2nd to b/l pulmonary infiltrates in setting of HIV/AIDS. ARDS >> respiratory mechanics/oxygenation improving. P:   F/u  CXR Pressure support wean as tolerated >> might getting close to extubation trial Changed BDs to prn  CARDIOVASCULAR Lt IJ CVL 7/10 >> A:  Hypotension 2nd to severe sepsis, hypovolemia, and sedatives >> improved 7/12. P:  Monitor hemodynamics  RENAL A:  Lactic acidosis >> resolved. P:   Monitor renal fx, urine outpt  GASTROINTESTINAL A:   Nutrition. P:   Tube feeds on vent Protonix for SUP  HEMATOLOGIC A:   Anemia of critical illness, chronic disease >> also hemodilution. P:  F/u CBC SCD, SQ heparin for DVT prophylaxis  INFECTIOUS A:   B/l pulmonary infiltrates in setting of HIV/AIDS >> CD4 less than 10 from 03/16/15; influenza PCR negative 7/09, Pneumocystis Ag negative 7/11. Immune reconstitution syndrome in setting of HIV/AIDS and antiretroviral therapy >> solumedrol started 7/10. P:   Day 4 rocephin, zithromax Day 4 bactrim >> defer to ID when to stop Antiretroviral therapy per ID Solumedrol started 7/10 immune reconstitution syndrome  Blood 7/09 >> Sputum 7/10 >> Legionella Ag 7/10 >> AFB blood 7/11 >>  ENDOCRINE A:   Steroid induced hyperglycemia. P:   SSI  NEUROLOGIC A:   Acute metabolic encephalopathy.  P:   RASS goal: 0  CC time 35 minutes.  Chesley Mires, MD [facility] Pulmonary/Critical Care 03/30/2015, 7:53 AM Pager:  [phone] After 3pm call: [phone]

## 2015-03-30 NOTE — Progress Notes (Signed)
Nutrition Follow-up  DOCUMENTATION CODES:  Not applicable  INTERVENTION: - Continue Carb Modified diet - RD will continue to monitor for needs; including need for supplements  NUTRITION DIAGNOSIS:  Inadequate oral intake related to inability to eat as evidenced by NPO status. -ongoing with diet just advanced 30 minutes ago and pt has not yet had a meal  GOAL:  Patient will meet greater than or equal to 90% of their needs -unmet  MONITOR:  PO intake, Weight trends, Labs, I & O's  ASSESSMENT: 59 y.o.F HIV on ART adm PM 7/9 to [facility] from ED with progressive dyspnea over months to acutely worsening and PCCM consulted early AM 03/28/2015 for bilateral progressive infiltrate.  7/12 - Pt extubated at 0824 today; needs re-estimated based on this  - Diet advanced from NPO to Carb Mod at 1230 and no intakes yet - Pt and visitor both sleeping at time of visit; did not feel it was necessary to awake them - Will need to obtain information about intakes PTA at follow up and also talk with pt at that time about supplements - Not meeting needs - Medications reviewed. Labs reviewed; CBGs: 115-184 mg/dL, Ca: 7.2 mg/dL, Phos: 1.7 mg/dL, LFTs elevated.  7/11 - Pt intubated with mVe: 12.5 L/min and no Propofol   Diet Order:  Diet Carb Modified Fluid consistency:: Thin; Room service appropriate?: Yes  Skin:  Reviewed, no issues  Last BM:  PTA  Height:  Ht Readings from Last 1 Encounters:  03/27/15 5\' 7"  (1.702 m)    Weight:  Wt Readings from Last 1 Encounters:  03/30/15 153 lb 7 oz (69.6 kg)    Ideal Body Weight:  61.4 kg  Wt Readings from Last 10 Encounters:  03/30/15 153 lb 7 oz (69.6 kg)  03/16/15 145 lb (65.772 kg)  05/22/12 169 lb 9.6 oz (76.93 kg)  10/03/10 180 lb (81.647 kg)  02/02/10 176 lb 1.9 oz (79.887 kg)    BMI:  Body mass index is 24.03 kg/(m^2).  Estimated Nutritional Needs:  Kcal:  1600-1800  Protein:  65-75 grams  Fluid:  2.1 L/day  EDUCATION  NEEDS:  No education needs identified at this time     Jarome Matin, RD, LDN Inpatient Clinical Dietitian Pager # [phone] After hours/weekend pager # [phone]

## 2015-03-30 NOTE — Progress Notes (Addendum)
INFECTIOUS DISEASE PROGRESS NOTE  ID: Courtney Escobar is a 59 y.o. female with  Principal Problem:   Severe sepsis with septic shock Active Problems:   HIV (human immunodeficiency virus infection)   CAP (community acquired pneumonia)   Leukocytosis   Acute respiratory failure with hypoxia  Subjective: Extubated, agitated  Abtx:  Anti-infectives    Start     Dose/Rate Route Frequency Ordered Stop   03/28/15 2000  emtricitabine-tenofovir (TRUVADA) 200-300 MG per tablet 1 tablet     1 tablet Oral Daily 03/28/15 1651     03/28/15 1500  azithromycin (ZITHROMAX) 500 mg in dextrose 5 % 250 mL IVPB     500 mg 250 mL/hr over 60 Minutes Intravenous Every 24 hours 03/27/15 1647     03/28/15 1000  sulfamethoxazole-trimethoprim (BACTRIM DS,SEPTRA DS) 800-160 MG per tablet 1 tablet  Status:  Discontinued     1 tablet Oral Daily 03/27/15 1728 03/28/15 0047   03/28/15 0100  sulfamethoxazole-trimethoprim (BACTRIM) 420 mg of trimethoprim in dextrose 5 % 500 mL IVPB     420 mg of trimethoprim 350.8 mL/hr over 90 Minutes Intravenous 3 times per day 03/28/15 0055     03/27/15 2000  cefTRIAXone (ROCEPHIN) 1 g in dextrose 5 % 50 mL IVPB - Premix     1 g 100 mL/hr over 30 Minutes Intravenous Every 24 hours 03/27/15 1647     03/27/15 2000  dolutegravir (TIVICAY) tablet 50 mg     50 mg Oral Daily 03/27/15 1753     03/27/15 1800  dolutegravir (TIVICAY) tablet 50 mg  Status:  Discontinued     50 mg Oral Daily 03/27/15 1706 03/27/15 1753   03/27/15 1800  emtricitabine-tenofovir AF (DESCOVY) 200-25 MG per tablet 1 tablet  Status:  Discontinued     1 tablet Oral Daily 03/27/15 1706 03/27/15 1720   03/27/15 1800  emtricitabine-tenofovir AF (DESCOVY) 200-25 MG per tablet 1 tablet  Status:  Discontinued     1 tablet Oral Daily 03/27/15 1720 03/28/15 1651   03/27/15 1645  cefTRIAXone (ROCEPHIN) 1 g in dextrose 5 % 50 mL IVPB - Premix  Status:  Discontinued     1 g 100 mL/hr over 30 Minutes Intravenous  Once  03/27/15 1642 03/27/15 1644   03/27/15 1645  azithromycin (ZITHROMAX) 500 mg in dextrose 5 % 250 mL IVPB  Status:  Discontinued     500 mg 250 mL/hr over 60 Minutes Intravenous  Once 03/27/15 1642 03/27/15 1644   03/27/15 1430  cefTRIAXone (ROCEPHIN) injection 250 mg     250 mg Intramuscular  Once 03/27/15 1415 03/27/15 1515   03/27/15 1430  azithromycin (ZITHROMAX) 500 mg in dextrose 5 % 250 mL IVPB     500 mg 250 mL/hr over 60 Minutes Intravenous  Once 03/27/15 1415 03/27/15 1614   03/27/15 1430  sulfamethoxazole-trimethoprim (BACTRIM DS,SEPTRA DS) 800-160 MG per tablet 1 tablet     1 tablet Oral  Once 03/27/15 1415 03/27/15 1514      Medications:  Scheduled: . antiseptic oral rinse  7 mL Mouth Rinse QID  . azithromycin  500 mg Intravenous Q24H  . cefTRIAXone (ROCEPHIN)  IV  1 g Intravenous Q24H  . chlorhexidine  15 mL Mouth Rinse BID  . dolutegravir  50 mg Oral Daily  . emtricitabine-tenofovir  1 tablet Oral Q2000  . heparin subcutaneous  5,000 Units Subcutaneous 3 times per day  . insulin aspart  0-20 Units Subcutaneous TID WC  . methylPREDNISolone (SOLU-MEDROL) injection  40 mg Intravenous BID  . pantoprazole (PROTONIX) IV  40 mg Intravenous Daily  . sodium chloride  1,000 mL Intravenous Once  . sodium chloride  3 mL Intravenous Q12H  . sulfamethoxazole-trimethoprim  420 mg of trimethoprim Intravenous 3 times per day    Objective: Vital signs in last 24 hours: Temp:  [99.1 F (37.3 C)-100 F (37.8 C)] 99.1 F (37.3 C) (07/12 1515) Pulse Rate:  [84-138] 113 (07/12 1515) Resp:  [15-46] 23 (07/12 1515) BP: (87-166)/(50-93) 134/79 mmHg (07/12 1515) SpO2:  [81 %-100 %] 94 % (07/12 1515) FiO2 (%):  [40 %] 40 % (07/12 0807) Weight:  [69.6 kg (153 lb 7 oz)] 69.6 kg (153 lb 7 oz) (07/12 0449)   General appearance: alert, cooperative and moderate distress Resp: rhonchi bilaterally and tachypneic Cardio: tachycardic GI: normal findings: bowel sounds normal and soft,  non-tender  Lab Results  Recent Labs  03/29/15 0234 03/30/15 0355  WBC 11.5* 9.5  HGB 9.3* 8.2*  HCT 27.3* 24.2*  NA 134* 136  K 3.7 4.1  CL 105 108  CO2 23 23  BUN 13 16  CREATININE 0.53 0.55   Liver Panel  Recent Labs  03/28/15 0625 03/30/15 0355  PROT 5.9* 5.2*  ALBUMIN 1.6* 1.3*  AST 148* 605*  ALT 65* 303*  ALKPHOS 337* 504*  BILITOT 2.1* 1.0   Sedimentation Rate No results for input(s): ESRSEDRATE in the last 72 hours. C-Reactive Protein No results for input(s): CRP in the last 72 hours.  Microbiology: Recent Results (from the past 240 hour(s))  MRSA PCR Screening     Status: None   Collection Time: 03/27/15  2:00 PM  Result Value Ref Range Status   MRSA by PCR NEGATIVE NEGATIVE Final    Comment:        The GeneXpert MRSA Assay (FDA approved for NASAL specimens only), is one component of a comprehensive MRSA colonization surveillance program. It is not intended to diagnose MRSA infection nor to guide or monitor treatment for MRSA infections.   Culture, blood (x 2)     Status: None (Preliminary result)   Collection Time: 03/27/15  5:09 PM  Result Value Ref Range Status   Specimen Description BLOOD RIGHT ARM  Final   Special Requests BOTTLES DRAWN AEROBIC AND ANAEROBIC 10CC  Final   Culture   Final    NO GROWTH 2 DAYS Performed at [facility]    Report Status PENDING  Incomplete  Culture, blood (x 2)     Status: None (Preliminary result)   Collection Time: 03/27/15  5:21 PM  Result Value Ref Range Status   Specimen Description BLOOD RIGHT ARM  Final   Special Requests BOTTLES DRAWN AEROBIC AND ANAEROBIC 10CC  Final   Culture   Final    NO GROWTH 2 DAYS Performed at [facility]    Report Status PENDING  Incomplete  Culture, respiratory (NON-Expectorated)     Status: None   Collection Time: 03/28/15  1:15 AM  Result Value Ref Range Status   Specimen Description TRACHEAL ASPIRATE  Final   Special Requests NONE  Final    Gram Stain   Final    NO WBC SEEN NO SQUAMOUS EPITHELIAL CELLS SEEN NO ORGANISMS SEEN Performed at [facility]    Culture   Final    NO GROWTH 2 DAYS Performed at [facility]    Report Status 03/30/2015 FINAL  Final  Pneumocystis smear by DFA     Status:  None   Collection Time: 03/29/15  8:30 AM  Result Value Ref Range Status   Specimen Source-PJSRC TRACHEAL ASPIRATE  Final   Pneumocystis jiroveci Ag NEGATIVE  Final    Comment: Performed at [facility]    Studies/Results: Dg Chest Port 1 View  03/30/2015   CLINICAL DATA:  ARDS.  EXAM: PORTABLE CHEST - 1 VIEW  FINDINGS: Endotracheal tube, NG tube, left IJ line in stable position. Unchanged bilateral diffuse airspace disease. Heart size normal. Tiny left pleural effusion cannot be excluded. No pneumothorax.  IMPRESSION: 1. Lines and tubes in stable position. 2. Persistent unchanged bilateral diffuse airspace disease .   Electronically Signed   By: [place]   On: 03/30/2015 07:06     Assessment/Plan: AIDS Hepatitis VDRF ?IRIS, PCP Hypotension Protein Calorie malnutrition, severe LTBI, prev treated 2001  Total days of antibiotics: 3 ceftriaxone/azithro, bactrim/ steroids DTGV/TRV   Her LFTs are up significantly.  Will stop bactrim.  LFTs can also increase from dolutegravir although this is unusual.  Will watch how she does off vent.  resp cx (-) Check HIV RNA        Bobby Rumpf Infectious Diseases (pager) [phone] [email or web address] 03/30/2015, 4:28 PM  LOS: 3 days

## 2015-03-30 NOTE — Progress Notes (Signed)
Pt's SBP in the mid 80's, MAP less than 65. notifed e-link MD. Bolus for 2units Normal Saline ordered. Will continue to monitor. Baldo Daub

## 2015-03-30 NOTE — Progress Notes (Signed)
Pt . Extubated per RT to O2 2Lpm [place], Sats are greater than 90%. OGT pulled upon extubation. Fentanyl and Versed stopped prior to extubation. Will continue to monitor.

## 2015-03-31 ENCOUNTER — Inpatient Hospital Stay (HOSPITAL_COMMUNITY): Payer: 59

## 2015-03-31 DIAGNOSIS — D893 Immune reconstitution syndrome: Secondary | ICD-10-CM | POA: Insufficient documentation

## 2015-03-31 DIAGNOSIS — J80 Acute respiratory distress syndrome: Secondary | ICD-10-CM | POA: Insufficient documentation

## 2015-03-31 LAB — GLUCOSE, CAPILLARY
Glucose-Capillary: 162 mg/dL — ABNORMAL HIGH (ref 65–99)
Glucose-Capillary: 172 mg/dL — ABNORMAL HIGH (ref 65–99)
Glucose-Capillary: 177 mg/dL — ABNORMAL HIGH (ref 65–99)
Glucose-Capillary: 166 mg/dL — ABNORMAL HIGH (ref 65–99)

## 2015-03-31 LAB — BASIC METABOLIC PANEL
Anion gap: 10 (ref 5–15)
BUN: 13 mg/dL (ref 6–20)
CALCIUM: 7.9 mg/dL — AB (ref 8.9–10.3)
CO2: 23 mmol/L (ref 22–32)
CREATININE: 0.49 mg/dL (ref 0.44–1.00)
Chloride: 106 mmol/L (ref 101–111)
GFR calc Af Amer: >60 mL/min (ref >60)
GFR calc non Af Amer: >60 mL/min (ref >60)
GLUCOSE: 163 mg/dL — AB (ref 65–99)
POTASSIUM: 3.7 mmol/L (ref 3.5–5.1)
Sodium: 139 mmol/L (ref 135–145)

## 2015-03-31 LAB — HIV ANTIBODY (ROUTINE TESTING W REFLEX): HIV SCREEN 4TH GENERATION: REACTIVE — AB

## 2015-03-31 LAB — HIV 1/2 AB DIFFERENTIATION
HIV 1 Ab: POSITIVE — AB
HIV 2 AB: NEGATIVE

## 2015-03-31 LAB — CBC
HEMATOCRIT: 30.2 % — AB (ref 36.0–46.0)
Hemoglobin: 10.3 g/dL — ABNORMAL LOW (ref 12.0–15.0)
MCH: 31.3 pg (ref 26.0–34.0)
MCHC: 34.1 g/dL (ref 30.0–36.0)
MCV: 91.8 fL (ref 78.0–100.0)
PLATELETS: 235 10*3/uL (ref 150–400)
RBC: 3.29 MIL/uL — ABNORMAL LOW (ref 3.87–5.11)
RDW: 15.7 % — ABNORMAL HIGH (ref 11.5–15.5)
WBC: 14.9 10*3/uL — AB (ref 4.0–10.5)

## 2015-03-31 MED ORDER — DEXMEDETOMIDINE HCL IN NACL 200 MCG/50ML IV SOLN
0.4000 ug/kg/h | INTRAVENOUS | Status: DC
  Administered 2015-03-31: 0.4 ug/kg/h via INTRAVENOUS
  Administered 2015-04-01 (×2): 0.7 ug/kg/h via INTRAVENOUS
  Filled 2015-03-31 (×3): qty 50

## 2015-03-31 MED ORDER — PREDNISONE 20 MG PO TABS
30.0000 mg | ORAL_TABLET | Freq: Every day | ORAL | Status: DC
  Administered 2015-03-31 – 2015-04-02 (×3): 30 mg via ORAL
  Filled 2015-03-31 (×6): qty 1

## 2015-03-31 MED ORDER — METOPROLOL TARTRATE 25 MG PO TABS
25.0000 mg | ORAL_TABLET | Freq: Two times a day (BID) | ORAL | Status: DC
  Administered 2015-03-31 – 2015-04-01 (×3): 25 mg via ORAL
  Filled 2015-03-31 (×3): qty 1

## 2015-03-31 MED ORDER — FUROSEMIDE 10 MG/ML IJ SOLN
40.0000 mg | Freq: Once | INTRAMUSCULAR | Status: AC
  Administered 2015-03-31: 40 mg via INTRAVENOUS
  Filled 2015-03-31: qty 4

## 2015-03-31 MED ORDER — PANTOPRAZOLE SODIUM 40 MG PO TBEC
40.0000 mg | DELAYED_RELEASE_TABLET | Freq: Every day | ORAL | Status: DC
  Administered 2015-04-01: 40 mg via ORAL
  Filled 2015-03-31: qty 1

## 2015-03-31 NOTE — Progress Notes (Signed)
Spoke with RN regarding pt. Pt appears to have some anxiety. BBS are c/d (prn Tx are available q2 if changes occur), with no adventitious sounds noted. VS WNL, and pt currently on 2lpm [place] (humidified). Will continue to monitor and follow up as necessary.

## 2015-03-31 NOTE — Progress Notes (Signed)
PULMONARY / CRITICAL CARE MEDICINE   Name: Courtney Escobar MRN: [id number] DOB: [birth year]    ADMISSION DATE:  03/27/2015 CONSULTATION DATE:  03/31/2015  REFERRING MD :  Charlies Silvers TRH  CHIEF COMPLAINT:   Resp distress, ARDS, poss PCP PNA  INITIAL PRESENTATION:  59 y.o. female former smoker with hx of HIV on ART admitted 7/9 to [facility] from ED with progressive dyspnea over months to acutely worsening and PCCM consulted early AM 03/31/2015 for bilateral progressive infiltrate. PMH - RA , chronic hepatitis with neg liver bx  STUDIES:  7/9 CT Chest >> Bilateral infiltrates  SIGNIFICANT EVENTS: 6/28 ID consult 7/10 intubated, ARDS protocol 7/11 Off pressors 7/12 Extubated  SUBJECTIVE:  She is more alert.  Getting frequent pain meds IV.  Still short of breath, but better.  VITAL SIGNS: Temp:  [98.6 F (37 C)-99.3 F (37.4 C)] 98.6 F (37 C) (07/13 0700) Pulse Rate:  [100-138] 117 (07/13 0700) Resp:  [16-46] 29 (07/13 0700) BP: (112-174)/(58-98) 167/94 mmHg (07/13 0700) SpO2:  [81 %-100 %] 100 % (07/13 0700) Weight:  [137 lb 9.1 oz (62.4 kg)] 137 lb 9.1 oz (62.4 kg) (07/13 0330) VENTILATOR SETTINGS:   INTAKE / OUTPUT:  Intake/Output Summary (Last 24 hours) at 03/31/15 0946 Last data filed at 03/31/15 0600  Gross per 24 hour  Intake   1460 ml  Output   2885 ml  Net  -1425 ml    PHYSICAL EXAMINATION: General: alert HEENT: no sinus tenderness Lungs: better air movement, no wheeze, faint b/l crackles Cardiovascular: regular, no murmur Abdomen: soft, non tender Musculoskeletal: 1+ edema Neuro: follows commands, moves all extremities Skin: purple macules on arms   LABS:  CBC  Recent Labs Lab 03/29/15 0234 03/30/15 0355 03/31/15 0530  WBC 11.5* 9.5 14.9*  HGB 9.3* 8.2* 10.3*  HCT 27.3* 24.2* 30.2*  PLT 216 194 235   Coag's  Recent Labs Lab 03/27/15 1730  APTT 24  INR 1.22   BMET  Recent Labs Lab 03/29/15 0234 03/30/15 0355 03/31/15 0530  NA 134* 136  139  K 3.7 4.1 3.7  CL 105 108 106  CO2 23 23 23   BUN 13 16 13   CREATININE 0.53 0.55 0.49  GLUCOSE 127* 126* 163*   Electrolytes  Recent Labs Lab 03/27/15 1650  03/28/15 1430 03/29/15 0234 03/30/15 0355 03/31/15 0530  CALCIUM 8.1*  < > 7.6* 7.5* 7.2* 7.9*  MG 1.7  --  1.8  --  1.9  --   PHOS 3.2  --   --   --  1.7*  --   < > = values in this interval not displayed. Sepsis Markers  Recent Labs Lab 03/27/15 1643  03/28/15 1430 03/29/15 0234 03/30/15 0355  LATICACIDVEN 2.6*  < > 3.5* 2.8* 1.9  PROCALCITON 1.83  --   --   --   --   < > = values in this interval not displayed. ABG  Recent Labs Lab 03/28/15 0310 03/28/15 0456 03/30/15 0434  PHART 7.205* 7.255* 7.362  PCO2ART 59.6* 49.0* 40.7  PO2ART 109* 105* 73.5*   Liver Enzymes  Recent Labs Lab 03/28/15 0625 03/30/15 0355  AST 148* 605*  ALT 65* 303*  ALKPHOS 337* 504*  BILITOT 2.1* 1.0  ALBUMIN 1.6* 1.3*   Glucose  Recent Labs Lab 03/30/15 0416 03/30/15 0757 03/30/15 1209 03/30/15 1548 03/30/15 2123 03/31/15 0820  GLUCAP 135* 155* 120* 156* 122* 162*    Imaging Dg Chest Port 1 View  03/31/2015   CLINICAL DATA:  Adult respiratory distress syndrome  EXAM: PORTABLE CHEST - 1 VIEW  COMPARISON:  March 30, 2015  FINDINGS: Endotracheal tube and nasogastric tube have been removed. Central catheter tip is in the superior vena cava. No pneumothorax. Widespread interstitial and patchy alveolar edema remain without change. Heart is upper normal in size with pulmonary vascularity within normal limits. No adenopathy. There is atherosclerotic change in the aorta.  IMPRESSION: No pneumothorax following removal of endotracheal tube and nasogastric tube. Central catheter position not appreciably changed. Widespread interstitial and alveolar edema remain. Appearance is consistent with ARDS. A degree of superimposed pneumonia cannot be excluded. Both entities may exist concurrently. No new opacity compared to 1 day  prior.   Electronically Signed   By: Lowella Grip III M.D.   On: 03/31/2015 06:58   Dg Chest Port 1 View  03/30/2015   CLINICAL DATA:  ARDS.  EXAM: PORTABLE CHEST - 1 VIEW  FINDINGS: Endotracheal tube, NG tube, left IJ line in stable position. Unchanged bilateral diffuse airspace disease. Heart size normal. Tiny left pleural effusion cannot be excluded. No pneumothorax.  IMPRESSION: 1. Lines and tubes in stable position. 2. Persistent unchanged bilateral diffuse airspace disease .   Electronically Signed   By: Marcello Moores  Register   On: 03/30/2015 07:06      ASSESSMENT / PLAN:  PULMONARY ETT 7/10 >> 7/12 A: Acute hypoxemic resp failure 2nd to b/l pulmonary infiltrates in setting of HIV/AIDS. ARDS >> respiratory mechanics/oxygenation improving. P:   F/u CXR intermittently Oxygen to keep SpO2 > 92% Lasix 40 mg IV x one 7/13 Changed BDs to prn  CARDIOVASCULAR Lt IJ CVL 7/10 >> A:  Hypotension 2nd to severe sepsis, hypovolemia, and sedatives >> resolved 7/13. Hypertension, tachycardia. P:  Monitor hemodynamics KVO IV fluids Add lopressor 7/13  RENAL A:  Lactic acidosis >> resolved. P:   Monitor renal fx, urine outpt  GASTROINTESTINAL A:   Nutrition. P:   Tube feeds on vent Protonix for SUP  HEMATOLOGIC A:   Anemia of critical illness, chronic disease >> also hemodilution. P:  F/u CBC SCD, SQ heparin for DVT prophylaxis  INFECTIOUS A:   B/l pulmonary infiltrates in setting of HIV/AIDS >> CD4 less than 10 from 03/16/15; influenza PCR negative 7/09, Pneumocystis Ag negative 7/11. Immune reconstitution syndrome in setting of HIV/AIDS and antiretroviral therapy >> solumedrol started 7/10. P:   Day 4 rocephin, zithromax Day 4 bactrim >> defer to ID when to stop Antiretroviral therapy per ID Solumedrol started 7/10 immune reconstitution syndrome >> change to prednisone 7/13  Blood 7/09 >> AFB blood 7/11 >>  ENDOCRINE A:   Steroid induced hyperglycemia. P:    SSI  NEUROLOGIC A:   Acute metabolic encephalopathy >> improving. Deconditioning. P:   PT/OT  Change to SDU status.  Will ask [facility] to resume care 7/14 and PCCM sign off.  Chesley Mires, MD [facility] Pulmonary/Critical Care 03/31/2015, 9:46 AM Pager:  [phone] After 3pm call: [phone]

## 2015-03-31 NOTE — Progress Notes (Signed)
INFECTIOUS DISEASE PROGRESS NOTE  ID: Courtney Escobar is a 59 y.o. female with  Principal Problem:   Severe sepsis with septic shock Active Problems:   HIV (human immunodeficiency virus infection)   CAP (community acquired pneumonia)   Leukocytosis   Acute respiratory failure with hypoxia  Subjective: Anxious. Has not eaten well.   Abtx:  Anti-infectives    Start     Dose/Rate Route Frequency Ordered Stop   03/28/15 2000  emtricitabine-tenofovir (TRUVADA) 200-300 MG per tablet 1 tablet     1 tablet Oral Daily 03/28/15 1651     03/28/15 1500  azithromycin (ZITHROMAX) 500 mg in dextrose 5 % 250 mL IVPB     500 mg 250 mL/hr over 60 Minutes Intravenous Every 24 hours 03/27/15 1647     03/28/15 1000  sulfamethoxazole-trimethoprim (BACTRIM DS,SEPTRA DS) 800-160 MG per tablet 1 tablet  Status:  Discontinued     1 tablet Oral Daily 03/27/15 1728 03/28/15 0047   03/28/15 0100  sulfamethoxazole-trimethoprim (BACTRIM) 420 mg of trimethoprim in dextrose 5 % 500 mL IVPB  Status:  Discontinued     420 mg of trimethoprim 350.8 mL/hr over 90 Minutes Intravenous 3 times per day 03/28/15 0055 03/30/15 1635   03/27/15 2000  cefTRIAXone (ROCEPHIN) 1 g in dextrose 5 % 50 mL IVPB - Premix     1 g 100 mL/hr over 30 Minutes Intravenous Every 24 hours 03/27/15 1647     03/27/15 2000  dolutegravir (TIVICAY) tablet 50 mg     50 mg Oral Daily 03/27/15 1753     03/27/15 1800  dolutegravir (TIVICAY) tablet 50 mg  Status:  Discontinued     50 mg Oral Daily 03/27/15 1706 03/27/15 1753   03/27/15 1800  emtricitabine-tenofovir AF (DESCOVY) 200-25 MG per tablet 1 tablet  Status:  Discontinued     1 tablet Oral Daily 03/27/15 1706 03/27/15 1720   03/27/15 1800  emtricitabine-tenofovir AF (DESCOVY) 200-25 MG per tablet 1 tablet  Status:  Discontinued     1 tablet Oral Daily 03/27/15 1720 03/28/15 1651   03/27/15 1645  cefTRIAXone (ROCEPHIN) 1 g in dextrose 5 % 50 mL IVPB - Premix  Status:  Discontinued     1  g 100 mL/hr over 30 Minutes Intravenous  Once 03/27/15 1642 03/27/15 1644   03/27/15 1645  azithromycin (ZITHROMAX) 500 mg in dextrose 5 % 250 mL IVPB  Status:  Discontinued     500 mg 250 mL/hr over 60 Minutes Intravenous  Once 03/27/15 1642 03/27/15 1644   03/27/15 1430  cefTRIAXone (ROCEPHIN) injection 250 mg     250 mg Intramuscular  Once 03/27/15 1415 03/27/15 1515   03/27/15 1430  azithromycin (ZITHROMAX) 500 mg in dextrose 5 % 250 mL IVPB     500 mg 250 mL/hr over 60 Minutes Intravenous  Once 03/27/15 1415 03/27/15 1614   03/27/15 1430  sulfamethoxazole-trimethoprim (BACTRIM DS,SEPTRA DS) 800-160 MG per tablet 1 tablet     1 tablet Oral  Once 03/27/15 1415 03/27/15 1514      Medications:  Scheduled: . antiseptic oral rinse  7 mL Mouth Rinse QID  . azithromycin  500 mg Intravenous Q24H  . cefTRIAXone (ROCEPHIN)  IV  1 g Intravenous Q24H  . chlorhexidine  15 mL Mouth Rinse BID  . dolutegravir  50 mg Oral Daily  . emtricitabine-tenofovir  1 tablet Oral Q2000  . heparin subcutaneous  5,000 Units Subcutaneous 3 times per day  . insulin aspart  0-20 Units  Subcutaneous TID WC  . metoprolol tartrate  25 mg Oral BID  . [START ON 04/01/2015] pantoprazole  40 mg Oral Daily  . predniSONE  30 mg Oral Q breakfast  . sodium chloride  1,000 mL Intravenous Once  . sodium chloride  3 mL Intravenous Q12H    Objective: Vital signs in last 24 hours: Temp:  [97.9 F (36.6 C)-99.3 F (37.4 C)] 97.9 F (36.6 C) (07/13 1300) Pulse Rate:  [100-126] 114 (07/13 1330) Resp:  [16-38] 23 (07/13 1300) BP: (121-179)/(71-105) 179/99 mmHg (07/13 1330) SpO2:  [92 %-100 %] 95 % (07/13 1300) Weight:  [62.4 kg (137 lb 9.1 oz)] 62.4 kg (137 lb 9.1 oz) (07/13 0330)   General appearance: alert, cooperative and mild distress Resp: rhonchi bilaterally and tachypneic Cardio: tachycardic GI: normal findings: bowel sounds normal and soft, non-tender  Lab Results  Recent Labs  03/30/15 0355  03/31/15 0530  WBC 9.5 14.9*  HGB 8.2* 10.3*  HCT 24.2* 30.2*  NA 136 139  K 4.1 3.7  CL 108 106  CO2 23 23  BUN 16 13  CREATININE 0.55 0.49   Liver Panel  Recent Labs  03/30/15 0355  PROT 5.2*  ALBUMIN 1.3*  AST 605*  ALT 303*  ALKPHOS 504*  BILITOT 1.0   Sedimentation Rate No results for input(s): ESRSEDRATE in the last 72 hours. C-Reactive Protein No results for input(s): CRP in the last 72 hours.  Microbiology: Recent Results (from the past 240 hour(s))  MRSA PCR Screening     Status: None   Collection Time: 03/27/15  2:00 PM  Result Value Ref Range Status   MRSA by PCR NEGATIVE NEGATIVE Final    Comment:        The GeneXpert MRSA Assay (FDA approved for NASAL specimens only), is one component of a comprehensive MRSA colonization surveillance program. It is not intended to diagnose MRSA infection nor to guide or monitor treatment for MRSA infections.   Culture, blood (x 2)     Status: None (Preliminary result)   Collection Time: 03/27/15  5:09 PM  Result Value Ref Range Status   Specimen Description BLOOD RIGHT ARM  Final   Special Requests BOTTLES DRAWN AEROBIC AND ANAEROBIC 10CC  Final   Culture   Final    NO GROWTH 3 DAYS Performed at [facility]    Report Status PENDING  Incomplete  Culture, blood (x 2)     Status: None (Preliminary result)   Collection Time: 03/27/15  5:21 PM  Result Value Ref Range Status   Specimen Description BLOOD RIGHT ARM  Final   Special Requests BOTTLES DRAWN AEROBIC AND ANAEROBIC 10CC  Final   Culture   Final    NO GROWTH 3 DAYS Performed at [facility]    Report Status PENDING  Incomplete  Culture, respiratory (NON-Expectorated)     Status: None   Collection Time: 03/28/15  1:15 AM  Result Value Ref Range Status   Specimen Description TRACHEAL ASPIRATE  Final   Special Requests NONE  Final   Gram Stain   Final    NO WBC SEEN NO SQUAMOUS EPITHELIAL CELLS SEEN NO ORGANISMS SEEN Performed at  [facility]    Culture   Final    NO GROWTH 2 DAYS Performed at [facility]    Report Status 03/30/2015 FINAL  Final  Pneumocystis smear by DFA     Status: None   Collection Time: 03/29/15  8:30 AM  Result Value  Ref Range Status   Specimen Source-PJSRC TRACHEAL ASPIRATE  Final   Pneumocystis jiroveci Ag NEGATIVE  Final    Comment: Performed at [place] [facility]    Studies/Results: Dg Chest Port 1 View  03/31/2015   CLINICAL DATA:  Adult respiratory distress syndrome  EXAM: PORTABLE CHEST - 1 VIEW  COMPARISON:  March 30, 2015  FINDINGS: Endotracheal tube and nasogastric tube have been removed. Central catheter tip is in the superior vena cava. No pneumothorax. Widespread interstitial and patchy alveolar edema remain without change. Heart is upper normal in size with pulmonary vascularity within normal limits. No adenopathy. There is atherosclerotic change in the aorta.  IMPRESSION: No pneumothorax following removal of endotracheal tube and nasogastric tube. Central catheter position not appreciably changed. Widespread interstitial and alveolar edema remain. Appearance is consistent with ARDS. A degree of superimposed pneumonia cannot be excluded. Both entities may exist concurrently. No new opacity compared to 1 day prior.   Electronically Signed   By: Lowella Grip III M.D.   On: 03/31/2015 06:58   Dg Chest Port 1 View  03/30/2015   CLINICAL DATA:  ARDS.  EXAM: PORTABLE CHEST - 1 VIEW  FINDINGS: Endotracheal tube, NG tube, left IJ line in stable position. Unchanged bilateral diffuse airspace disease. Heart size normal. Tiny left pleural effusion cannot be excluded. No pneumothorax.  IMPRESSION: 1. Lines and tubes in stable position. 2. Persistent unchanged bilateral diffuse airspace disease .   Electronically Signed   By: [place]   On: 03/30/2015 07:06     Assessment/Plan: AIDS Hepatitis VDRF ?IRIS, PCP Hypotension Protein Calorie malnutrition,  severe LTBI, prev treated 2001  Total days of antibiotics: 4 ceftriaxone/azithro, steroids, DTGV/TRV  Will recheck her LFTs with AM lab. If still up, stop azithro.  She is improving. Saturating well on [place].  Will continue to watch.        Bobby Rumpf Infectious Diseases (pager) [phone] [email or web address] 03/31/2015, 4:00 PM  LOS: 4 days

## 2015-03-31 NOTE — Evaluation (Signed)
Physical Therapy Evaluation Patient Details Name: Jersie Beel MRN: [id number] DOB: 1956-06-01 Today's Date: 03/31/2015   History of Present Illness  Pt is a 59 year old female with hx of HIV admitted 7/9 for progressive dyspnea and intubated 7/10 for acute hypoxemic resp failure due to bilateral progressive infiltrate and extubated 7/12  Clinical Impression  Pt admitted with above diagnosis. Pt currently with functional limitations due to the deficits listed below (see PT Problem List).  Pt will benefit from skilled PT to increase their independence and safety with mobility to allow discharge to the venue listed below.   Pt assisted with standing and taking a few steps up [facility] today with min assist to steady.  Further mobility deferred at this time due to high RR (up to 46) with mobility and BP (presession 170/89 mmHg and post session 141/114 mmHg).   Pt very agreeable to mobilize and plans to d/c home with her husband.       Follow Up Recommendations Home health PT (depending on progress)    Equipment Recommendations  None recommended by PT    Recommendations for Other Services       Precautions / Restrictions Precautions Precautions: Fall Precaution Comments: monitor vitals      Mobility  Bed Mobility Overal bed mobility: Needs Assistance Bed Mobility: Supine to Sit     Supine to sit: Min guard     General bed mobility comments: min/guard for multiple lines  Transfers Overall transfer level: Needs assistance Equipment used: 2 person hand held assist Transfers: Sit to/from Stand Sit to Stand: Min assist         General transfer comment: slight assist to steady due to posterior lean upon standing, only took a couples steps up [place] due to high RR 44 and high BP  Ambulation/Gait Ambulation/Gait assistance:  (deferred due to vitals)              Stairs            Wheelchair Mobility    Modified Rankin (Stroke Patients Only)       Balance                                              Pertinent Vitals/Pain Pain Assessment: No/denies pain    Home Living Family/patient expects to be discharged to:: Private residence Living Arrangements: Spouse/significant other           Home Layout: One level Home Equipment: None      Prior Function Level of Independence: Independent               Hand Dominance        Extremity/Trunk Assessment               Lower Extremity Assessment: Generalized weakness         Communication   Communication: No difficulties  Cognition Arousal/Alertness: Awake/alert Behavior During Therapy: WFL for tasks assessed/performed Overall Cognitive Status: Within Functional Limits for tasks assessed                      General Comments      Exercises        Assessment/Plan    PT Assessment Patient needs continued PT services  PT Diagnosis Difficulty walking;Generalized weakness   PT Problem List Decreased strength;Decreased activity tolerance;Decreased mobility;Cardiopulmonary status limiting activity;Decreased knowledge  of use of DME  PT Treatment Interventions DME instruction;Gait training;Functional mobility training;Therapeutic activities;Therapeutic exercise;Patient/family education   PT Goals (Current goals can be found in the Care Plan section) Acute Rehab PT Goals Patient Stated Goal: return home  PT Goal Formulation: With patient Time For Goal Achievement: 04/07/15 Potential to Achieve Goals: Good    Frequency Min 3X/week   Barriers to discharge        Co-evaluation               End of Session   Activity Tolerance: Patient tolerated treatment well Patient left: in bed;with call bell/phone within reach Nurse Communication: Mobility status (aware of BP)         Time: 0109-3235 PT Time Calculation (min) (ACUTE ONLY): 13 min   Charges:   PT Evaluation $Initial PT Evaluation Tier I: 1 Procedure     PT G Codes:         Garnie Borchardt,KATHrine E 03/31/2015, 3:54 PM Carmelia Bake, PT, DPT 03/31/2015 Pager: [phone]

## 2015-04-01 DIAGNOSIS — A419 Sepsis, unspecified organism: Secondary | ICD-10-CM

## 2015-04-01 LAB — COMPREHENSIVE METABOLIC PANEL
ALK PHOS: 560 U/L — AB (ref 38–126)
ALT: 270 U/L — ABNORMAL HIGH (ref 14–54)
AST: 167 U/L — AB (ref 15–41)
Albumin: 1.6 g/dL — ABNORMAL LOW (ref 3.5–5.0)
Anion gap: 5 (ref 5–15)
BUN: 11 mg/dL (ref 6–20)
CHLORIDE: 103 mmol/L (ref 101–111)
CO2: 25 mmol/L (ref 22–32)
Calcium: 7.6 mg/dL — ABNORMAL LOW (ref 8.9–10.3)
Creatinine, Ser: 0.33 mg/dL — ABNORMAL LOW (ref 0.44–1.00)
GFR calc Af Amer: >60 mL/min (ref >60)
GLUCOSE: 106 mg/dL — AB (ref 65–99)
Potassium: 3.5 mmol/L (ref 3.5–5.1)
Sodium: 133 mmol/L — ABNORMAL LOW (ref 135–145)
Total Bilirubin: 1 mg/dL (ref 0.3–1.2)
Total Protein: 5.7 g/dL — ABNORMAL LOW (ref 6.5–8.1)

## 2015-04-01 LAB — CBC
HEMATOCRIT: 28.5 % — AB (ref 36.0–46.0)
HEMOGLOBIN: 9.9 g/dL — AB (ref 12.0–15.0)
MCH: 31.9 pg (ref 26.0–34.0)
MCHC: 34.7 g/dL (ref 30.0–36.0)
MCV: 91.9 fL (ref 78.0–100.0)
Platelets: 201 10*3/uL (ref 150–400)
RBC: 3.1 MIL/uL — ABNORMAL LOW (ref 3.87–5.11)
RDW: 15.6 % — AB (ref 11.5–15.5)
WBC: 13.1 10*3/uL — ABNORMAL HIGH (ref 4.0–10.5)

## 2015-04-01 LAB — GLUCOSE, CAPILLARY
GLUCOSE-CAPILLARY: 123 mg/dL — AB (ref 65–99)
Glucose-Capillary: 157 mg/dL — ABNORMAL HIGH (ref 65–99)
Glucose-Capillary: 108 mg/dL — ABNORMAL HIGH (ref 65–99)
Glucose-Capillary: 174 mg/dL — ABNORMAL HIGH (ref 65–99)

## 2015-04-01 LAB — MAGNESIUM: Magnesium: 1.7 mg/dL (ref 1.7–2.4)

## 2015-04-01 MED ORDER — HYDROXYZINE HCL 25 MG PO TABS
25.0000 mg | ORAL_TABLET | Freq: Three times a day (TID) | ORAL | Status: DC | PRN
  Administered 2015-04-01 (×2): 25 mg via ORAL
  Filled 2015-04-01 (×2): qty 1

## 2015-04-01 MED ORDER — METOPROLOL TARTRATE 25 MG PO TABS
50.0000 mg | ORAL_TABLET | Freq: Two times a day (BID) | ORAL | Status: DC
  Administered 2015-04-01 – 2015-04-02 (×2): 50 mg via ORAL
  Filled 2015-04-01 (×2): qty 2

## 2015-04-01 MED ORDER — FAMOTIDINE 20 MG PO TABS
20.0000 mg | ORAL_TABLET | Freq: Two times a day (BID) | ORAL | Status: DC
  Administered 2015-04-01 – 2015-04-02 (×2): 20 mg via ORAL
  Filled 2015-04-01 (×2): qty 1

## 2015-04-01 NOTE — Progress Notes (Signed)
Physical Therapy Treatment Patient Details Name: Courtney Escobar MRN: [id number] DOB: 06-14-1956 Today's Date: 04/01/2015    History of Present Illness Pt is a 59 year old female with hx of HIV admitted 7/9 for progressive dyspnea and intubated 7/10 for acute hypoxemic resp failure due to bilateral progressive infiltrate and extubated 7/12    PT Comments    Patient ambulated on RA x 180' with RW, sats >95, RR 30, HR 117. Patient will benefit from PT at DC.  Follow Up Recommendations  Home health PT;Supervision/Assistance - 24 hour     Equipment Recommendations  None recommended by PT (may need a RW)    Recommendations for Other Services       Precautions / Restrictions Precautions Precautions: Fall Precaution Comments: monitor vitals Restrictions Weight Bearing Restrictions: No    Mobility  Bed Mobility   Bed Mobility: Supine to Sit     Supine to sit: Min guard     General bed mobility comments: /guard for multiple lines  Transfers Overall transfer level: Needs assistance Equipment used: Rolling walker (2 wheeled) Transfers: Sit to/from Stand Sit to Stand: Min guard         General transfer comment: cues for hand placement/  Ambulation/Gait Ambulation/Gait assistance: Min assist;+2 safety/equipment Ambulation Distance (Feet): 150 Feet Assistive device: Rolling walker (2 wheeled)       General Gait Details: initially unsteady upon standing, felt patient needed to use walker.   Stairs            Wheelchair Mobility    Modified Rankin (Stroke Patients Only)       Balance Overall balance assessment: Needs assistance         Standing balance support: During functional activity;No upper extremity supported Standing balance-Leahy Scale: Fair                      Cognition Arousal/Alertness: Awake/alert Behavior During Therapy: WFL for tasks assessed/performed Overall Cognitive Status: Within Functional Limits for tasks  assessed                      Exercises      General Comments        Pertinent Vitals/Pain Pain Assessment: No/denies pain    Home Living Family/patient expects to be discharged to:: Private residence Living Arrangements: Spouse/significant other Available Help at Discharge: Family   Home Access: Level entry   Home Layout: One level Home Equipment: None      Prior Function Level of Independence: Independent          PT Goals (current goals can now be found in the care plan section) Acute Rehab PT Goals Patient Stated Goal: return home  Progress towards PT goals: Progressing toward goals    Frequency  Min 3X/week    PT Plan Current plan remains appropriate    Co-evaluation PT/OT/SLP Co-Evaluation/Treatment: Yes Reason for Co-Treatment: For patient/therapist safety PT goals addressed during session: Mobility/safety with mobility OT goals addressed during session: ADL's and self-care     End of Session Equipment Utilized During Treatment: Gait belt Activity Tolerance: Patient tolerated treatment well Patient left: in chair;with call bell/phone within reach     Time: 1237-1256 PT Time Calculation (min) (ACUTE ONLY): 19 min  Charges:  $Gait Training: 8-22 mins                    G Codes:      Claretha Cooper 04/01/2015, 2:02 PM Tresa Endo  PT [phone]  .

## 2015-04-01 NOTE — Progress Notes (Signed)
INFECTIOUS DISEASE PROGRESS NOTE  ID: Courtney Escobar is a 59 y.o. female with  Principal Problem:   Severe sepsis with septic shock Active Problems:   HIV (human immunodeficiency virus infection)   CAP (community acquired pneumonia)   Leukocytosis   Acute respiratory failure with hypoxia   ARDS (adult respiratory distress syndrome)   IRIS (immune reconstitution inflammatory syndrome)  Subjective: Without complaints. anxioius  Abtx:  Anti-infectives    Start     Dose/Rate Route Frequency Ordered Stop   03/28/15 2000  emtricitabine-tenofovir (TRUVADA) 200-300 MG per tablet 1 tablet     1 tablet Oral Daily 03/28/15 1651     03/28/15 1500  azithromycin (ZITHROMAX) 500 mg in dextrose 5 % 250 mL IVPB     500 mg 250 mL/hr over 60 Minutes Intravenous Every 24 hours 03/27/15 1647     03/28/15 1000  sulfamethoxazole-trimethoprim (BACTRIM DS,SEPTRA DS) 800-160 MG per tablet 1 tablet  Status:  Discontinued     1 tablet Oral Daily 03/27/15 1728 03/28/15 0047   03/28/15 0100  sulfamethoxazole-trimethoprim (BACTRIM) 420 mg of trimethoprim in dextrose 5 % 500 mL IVPB  Status:  Discontinued     420 mg of trimethoprim 350.8 mL/hr over 90 Minutes Intravenous 3 times per day 03/28/15 0055 03/30/15 1635   03/27/15 2000  cefTRIAXone (ROCEPHIN) 1 g in dextrose 5 % 50 mL IVPB - Premix     1 g 100 mL/hr over 30 Minutes Intravenous Every 24 hours 03/27/15 1647     03/27/15 2000  dolutegravir (TIVICAY) tablet 50 mg     50 mg Oral Daily 03/27/15 1753     03/27/15 1800  dolutegravir (TIVICAY) tablet 50 mg  Status:  Discontinued     50 mg Oral Daily 03/27/15 1706 03/27/15 1753   03/27/15 1800  emtricitabine-tenofovir AF (DESCOVY) 200-25 MG per tablet 1 tablet  Status:  Discontinued     1 tablet Oral Daily 03/27/15 1706 03/27/15 1720   03/27/15 1800  emtricitabine-tenofovir AF (DESCOVY) 200-25 MG per tablet 1 tablet  Status:  Discontinued     1 tablet Oral Daily 03/27/15 1720 03/28/15 1651   03/27/15  1645  cefTRIAXone (ROCEPHIN) 1 g in dextrose 5 % 50 mL IVPB - Premix  Status:  Discontinued     1 g 100 mL/hr over 30 Minutes Intravenous  Once 03/27/15 1642 03/27/15 1644   03/27/15 1645  azithromycin (ZITHROMAX) 500 mg in dextrose 5 % 250 mL IVPB  Status:  Discontinued     500 mg 250 mL/hr over 60 Minutes Intravenous  Once 03/27/15 1642 03/27/15 1644   03/27/15 1430  cefTRIAXone (ROCEPHIN) injection 250 mg     250 mg Intramuscular  Once 03/27/15 1415 03/27/15 1515   03/27/15 1430  azithromycin (ZITHROMAX) 500 mg in dextrose 5 % 250 mL IVPB     500 mg 250 mL/hr over 60 Minutes Intravenous  Once 03/27/15 1415 03/27/15 1614   03/27/15 1430  sulfamethoxazole-trimethoprim (BACTRIM DS,SEPTRA DS) 800-160 MG per tablet 1 tablet     1 tablet Oral  Once 03/27/15 1415 03/27/15 1514      Medications:  Scheduled: . antiseptic oral rinse  7 mL Mouth Rinse QID  . azithromycin  500 mg Intravenous Q24H  . cefTRIAXone (ROCEPHIN)  IV  1 g Intravenous Q24H  . chlorhexidine  15 mL Mouth Rinse BID  . dolutegravir  50 mg Oral Daily  . emtricitabine-tenofovir  1 tablet Oral Q2000  . heparin subcutaneous  5,000 Units Subcutaneous  3 times per day  . insulin aspart  0-20 Units Subcutaneous TID WC  . metoprolol tartrate  25 mg Oral BID  . pantoprazole  40 mg Oral Daily  . predniSONE  30 mg Oral Q breakfast  . sodium chloride  1,000 mL Intravenous Once  . sodium chloride  3 mL Intravenous Q12H    Objective: Vital signs in last 24 hours: Temp:  [96.4 F (35.8 C)-98.1 F (36.7 C)] 96.4 F (35.8 C) (07/14 0505) Pulse Rate:  [74-121] 74 (07/14 0505) Resp:  [20-44] 28 (07/14 0505) BP: (140-179)/(76-114) 153/93 mmHg (07/14 0505) SpO2:  [94 %-100 %] 100 % (07/14 0505)   General appearance: alert, cooperative and mild distress Resp: rhonchi bilaterally and tachypnea Cardio: tachycardia GI: normal findings: bowel sounds normal and soft, non-tender  Lab Results  Recent Labs  03/31/15 0530  04/01/15 0400  WBC 14.9* 13.1*  HGB 10.3* 9.9*  HCT 30.2* 28.5*  NA 139 133*  K 3.7 3.5  CL 106 103  CO2 23 25  BUN 13 11  CREATININE 0.49 0.33*   Liver Panel  Recent Labs  03/30/15 0355 04/01/15 0400  PROT 5.2* 5.7*  ALBUMIN 1.3* 1.6*  AST 605* 167*  ALT 303* 270*  ALKPHOS 504* 560*  BILITOT 1.0 1.0   Sedimentation Rate No results for input(s): ESRSEDRATE in the last 72 hours. C-Reactive Protein No results for input(s): CRP in the last 72 hours.  Microbiology: Recent Results (from the past 240 hour(s))  MRSA PCR Screening     Status: None   Collection Time: 03/27/15  2:00 PM  Result Value Ref Range Status   MRSA by PCR NEGATIVE NEGATIVE Final    Comment:        The GeneXpert MRSA Assay (FDA approved for NASAL specimens only), is one component of a comprehensive MRSA colonization surveillance program. It is not intended to diagnose MRSA infection nor to guide or monitor treatment for MRSA infections.   Culture, blood (x 2)     Status: None (Preliminary result)   Collection Time: 03/27/15  5:09 PM  Result Value Ref Range Status   Specimen Description BLOOD RIGHT ARM  Final   Special Requests BOTTLES DRAWN AEROBIC AND ANAEROBIC 10CC  Final   Culture   Final    NO GROWTH 3 DAYS Performed at [facility]    Report Status PENDING  Incomplete  Culture, blood (x 2)     Status: None (Preliminary result)   Collection Time: 03/27/15  5:21 PM  Result Value Ref Range Status   Specimen Description BLOOD RIGHT ARM  Final   Special Requests BOTTLES DRAWN AEROBIC AND ANAEROBIC 10CC  Final   Culture   Final    NO GROWTH 3 DAYS Performed at [facility]    Report Status PENDING  Incomplete  Culture, respiratory (NON-Expectorated)     Status: None   Collection Time: 03/28/15  1:15 AM  Result Value Ref Range Status   Specimen Description TRACHEAL ASPIRATE  Final   Special Requests NONE  Final   Gram Stain   Final    NO WBC SEEN NO SQUAMOUS  EPITHELIAL CELLS SEEN NO ORGANISMS SEEN Performed at [facility]    Culture   Final    NO GROWTH 2 DAYS Performed at [facility]    Report Status 03/30/2015 FINAL  Final  Pneumocystis smear by DFA     Status: None   Collection Time: 03/29/15  8:30 AM  Result Value  Ref Range Status   Specimen Source-PJSRC TRACHEAL ASPIRATE  Final   Pneumocystis jiroveci Ag NEGATIVE  Final    Comment: Performed at [place] [facility]    Studies/Results: Dg Chest Port 1 View  03/31/2015   CLINICAL DATA:  Adult respiratory distress syndrome  EXAM: PORTABLE CHEST - 1 VIEW  COMPARISON:  March 30, 2015  FINDINGS: Endotracheal tube and nasogastric tube have been removed. Central catheter tip is in the superior vena cava. No pneumothorax. Widespread interstitial and patchy alveolar edema remain without change. Heart is upper normal in size with pulmonary vascularity within normal limits. No adenopathy. There is atherosclerotic change in the aorta.  IMPRESSION: No pneumothorax following removal of endotracheal tube and nasogastric tube. Central catheter position not appreciably changed. Widespread interstitial and alveolar edema remain. Appearance is consistent with ARDS. A degree of superimposed pneumonia cannot be excluded. Both entities may exist concurrently. No new opacity compared to 1 day prior.   Electronically Signed   By: Lowella Grip III M.D.   On: 03/31/2015 06:58     Assessment/Plan: AIDS Hepatitis VDRF ?IRIS, PCP Hypotension- improved Anxiety Protein Calorie malnutrition, severe LTBI, prev treated 2001  Total days of antibiotics: 5 ceftriaxone/azithro, steroids, DTGV/TRV  Her LFTs are improving ?anxiolytic. Some concern this could affect her respiratory drive and cause re-intubation. Defer to CCM.  Aim to stop ceftriaxone and azithro at day 7 HIV RNA pending.          Bobby Rumpf Infectious Diseases (pager) [phone] [email or web address] 04/01/2015,  10:05 AM  LOS: 5 days

## 2015-04-01 NOTE — Evaluation (Signed)
Occupational Therapy Evaluation Patient Details Name: Courtney Escobar MRN: [id number] DOB: 1956/01/11 Today's Date: 04/01/2015    History of Present Illness Pt is a 59 year old female with hx of HIV admitted 7/9 for progressive dyspnea and intubated 7/10 for acute hypoxemic resp failure due to bilateral progressive infiltrate and extubated 7/12   Clinical Impression   Pt was admitted for the above.  She will benefit from skilled OT to increase safety and independence with adls.  Pt was independent prior to admission, performed IADLs and worked.  She currently needs min guard to min A.  Goals in acute are for supervision level, mostly    Follow Up Recommendations  Supervision/Assistance - 24 hour initially;Home health OT    Equipment Recommendations   (to be further assessed)    Recommendations for Other Services       Precautions / Restrictions Precautions Precautions: Fall Precaution Comments: monitor vitals Restrictions Weight Bearing Restrictions: No      Mobility Bed Mobility   Bed Mobility: Supine to Sit     Supine to sit: Min guard     General bed mobility comments: /guard for multiple lines  Transfers Overall transfer level: Needs assistance Equipment used: Rolling walker (2 wheeled) Transfers: Sit to/from Stand Sit to Stand: Min guard         General transfer comment: cues for hand placement/    Balance Overall balance assessment: Needs assistance         Standing balance support: During functional activity;No upper extremity supported Standing balance-Leahy Scale: Fair                              ADL Overall ADL's : Needs assistance/impaired     Grooming: Set up;Sitting   Upper Body Bathing: Set up;Sitting   Lower Body Bathing: Min guard;Sit to/from stand   Upper Body Dressing : Set up;Sitting   Lower Body Dressing: Min guard;Sit to/from stand   Toilet Transfer: Minimal assistance;Ambulation (recliner)   Toileting-  Clothing Manipulation and Hygiene: Min guard;Sit to/from stand         General ADL Comments: recommended pt sponge bathe initially. Husband is home days; works 3rd shift.  Pt gets up several times per night for toileting.  Sats 95-100% on RA, RR 30 and HR up to 117     Vision     Perception     Praxis      Pertinent Vitals/Pain Pain Assessment: No/denies pain     Hand Dominance     Extremity/Trunk Assessment Upper Extremity Assessment Upper Extremity Assessment: Overall WFL for tasks assessed           Communication Communication Communication: No difficulties   Cognition Arousal/Alertness: Awake/alert Behavior During Therapy: WFL for tasks assessed/performed Overall Cognitive Status: Within Functional Limits for tasks assessed                     General Comments       Exercises       Shoulder Instructions      Home Living Family/patient expects to be discharged to:: Private residence Living Arrangements: Spouse/significant other Available Help at Discharge: Family   Home Access: Level entry     Home Layout: One level     Bathroom Shower/Tub: Tub/shower unit Shower/tub characteristics: Architectural technologist: Standard     Home Equipment: None          Prior Functioning/Environment Level of Independence: Independent  OT Diagnosis: Generalized weakness   OT Problem List: Decreased strength;Decreased activity tolerance;Impaired balance (sitting and/or standing);Decreased knowledge of use of DME or AE   OT Treatment/Interventions: Self-care/ADL training;DME and/or AE instruction;Patient/family education;Balance training    OT Goals(Current goals can be found in the care plan section) Acute Rehab OT Goals Patient Stated Goal: return home  OT Goal Formulation: With patient Time For Goal Achievement: 04/08/15 Potential to Achieve Goals: Good ADL Goals Pt Will Transfer to Toilet: with supervision;ambulating;regular  height toilet (and SPT to 3:1 commode) Additional ADL Goal #1: pt will complete ADL with set up, sit to stand Additional ADL Goal #2: pt will be mod I for hygiene, sit to lean/sit to stand  OT Frequency: Min 2X/week   Barriers to D/C:            Co-evaluation PT/OT/SLP Co-Evaluation/Treatment: Yes Reason for Co-Treatment: For patient/therapist safety PT goals addressed during session: Mobility/safety with mobility OT goals addressed during session: ADL's and self-care      End of Session Nurse Communication: Mobility status  Activity Tolerance: Patient tolerated treatment well Patient left: in chair;with call bell/phone within reach   Time: 1237-1257 OT Time Calculation (min): 20 min Charges:  OT General Charges $OT Visit: 1 Procedure OT Evaluation $Initial OT Evaluation Tier I: 1 Procedure G-Codes:    Liyah Higham 04/08/15, 2:00 PM Lesle Chris, OTR/L [phone] 04/08/2015

## 2015-04-01 NOTE — Progress Notes (Signed)
Patient walked in hall with physical therapy with walker.Lowest o2 sat observed was 96 on room air.

## 2015-04-01 NOTE — Progress Notes (Addendum)
[facility] Progress Note   Courtney Escobar NLZ:[id number] DOB: [birth year] DOA: 03/27/2015 PCP: No primary care provider on file.  Brief narrative: Courtney Escobar is a 59 y.o. female with RA, recently diagnosed with HIV in 6/16 and started on HAART (CD 4  Was 11) which she was taking appropriately. She presented to the hospital with dyspnea, pulse ox of 88% and multifocal patchy infiltrates on CXR. Started on Rocephin, zithromax and Bacrtim. She required intubation on early AM 7/10. Solumedrol added for possible PCP.    Subjective: No dyspnea, cough, vomiting, diarrhea. Wants to go home.   Assessment/Plan: Principal Problem:   Acute respiratory failure with hypoxia(ARDS) / CAP  IRIS (immune reconstitution inflammatory syndrome) - vent dependant from 7/12 through 7/12- now 100 % on room air but core temp 96.4 -resp failure suspected to be due to immune reconstitution syndrome by ID as recently started on HAART - PCP antigen negative - Bactrim stopped on 7/12 - cont Rocephin and Zithromax x 7 days per ID- 7/15 last dose  - Using steroids for IRIS- per pulm will need for total of 2 wks- started 7/10- wean slowly keeping in mind stop date of 7/23- currently on prednisone 30 mg   Active Problems:  HIV (human immunodeficiency virus infection) - HAART per ID - HIV RNA pending  Severe sepsis with septic shock/ Lactic acidosis - pressors stopped on 7/11- lactic acid improved  Elevated LFTs - due to sepsis or Bactrim?- improving   Steroid induced hyperglycemia - sugars stable for past 24 hrs- not needing insulin - stop Insulin sliding scale  HTN - cont Metoprolol- increase to 50 BID    Code Status: Full code Family Communication: husband  Disposition Plan: transfer out of sDu today -remove foley- ambulate- check pulse ox on ambulation- home tomorrow DVT prophylaxis: Heparin Consultants: PCCM, ID  Procedures: Intubation Right IJ triple lumen  Antibiotics: Anti-infectives     Start     Dose/Rate Route Frequency Ordered Stop   03/28/15 2000  emtricitabine-tenofovir (TRUVADA) 200-300 MG per tablet 1 tablet     1 tablet Oral Daily 03/28/15 1651     03/28/15 1500  azithromycin (ZITHROMAX) 500 mg in dextrose 5 % 250 mL IVPB     500 mg 250 mL/hr over 60 Minutes Intravenous Every 24 hours 03/27/15 1647     03/28/15 1000  sulfamethoxazole-trimethoprim (BACTRIM DS,SEPTRA DS) 800-160 MG per tablet 1 tablet  Status:  Discontinued     1 tablet Oral Daily 03/27/15 1728 03/28/15 0047   03/28/15 0100  sulfamethoxazole-trimethoprim (BACTRIM) 420 mg of trimethoprim in dextrose 5 % 500 mL IVPB  Status:  Discontinued     420 mg of trimethoprim 350.8 mL/hr over 90 Minutes Intravenous 3 times per day 03/28/15 0055 03/30/15 1635   03/27/15 2000  cefTRIAXone (ROCEPHIN) 1 g in dextrose 5 % 50 mL IVPB - Premix     1 g 100 mL/hr over 30 Minutes Intravenous Every 24 hours 03/27/15 1647     03/27/15 2000  dolutegravir (TIVICAY) tablet 50 mg     50 mg Oral Daily 03/27/15 1753     03/27/15 1800  dolutegravir (TIVICAY) tablet 50 mg  Status:  Discontinued     50 mg Oral Daily 03/27/15 1706 03/27/15 1753   03/27/15 1800  emtricitabine-tenofovir AF (DESCOVY) 200-25 MG per tablet 1 tablet  Status:  Discontinued     1 tablet Oral Daily 03/27/15 1706 03/27/15 1720   03/27/15 1800  emtricitabine-tenofovir AF (DESCOVY) 200-25 MG per tablet  1 tablet  Status:  Discontinued     1 tablet Oral Daily 03/27/15 1720 03/28/15 1651   03/27/15 1645  cefTRIAXone (ROCEPHIN) 1 g in dextrose 5 % 50 mL IVPB - Premix  Status:  Discontinued     1 g 100 mL/hr over 30 Minutes Intravenous  Once 03/27/15 1642 03/27/15 1644   03/27/15 1645  azithromycin (ZITHROMAX) 500 mg in dextrose 5 % 250 mL IVPB  Status:  Discontinued     500 mg 250 mL/hr over 60 Minutes Intravenous  Once 03/27/15 1642 03/27/15 1644   03/27/15 1430  cefTRIAXone (ROCEPHIN) injection 250 mg     250 mg Intramuscular  Once 03/27/15 1415 03/27/15 1515    03/27/15 1430  azithromycin (ZITHROMAX) 500 mg in dextrose 5 % 250 mL IVPB     500 mg 250 mL/hr over 60 Minutes Intravenous  Once 03/27/15 1415 03/27/15 1614   03/27/15 1430  sulfamethoxazole-trimethoprim (BACTRIM DS,SEPTRA DS) 800-160 MG per tablet 1 tablet     1 tablet Oral  Once 03/27/15 1415 03/27/15 1514      Objective: Filed Weights   03/29/15 2000 03/30/15 0449 03/31/15 0330  Weight: 69.6 kg (153 lb 7 oz) 69.6 kg (153 lb 7 oz) 62.4 kg (137 lb 9.1 oz)    Intake/Output Summary (Last 24 hours) at 04/01/15 1028 Last data filed at 04/01/15 1017  Gross per 24 hour  Intake 945.18 ml  Output   7151 ml  Net -6205.82 ml     Vitals Filed Vitals:   04/01/15 0100 04/01/15 0200 04/01/15 0300 04/01/15 0505  BP: 158/96 146/98 147/97 153/93  Pulse: 90 76 78 74  Temp: 96.8 F (36 C) 96.8 F (36 C) 96.8 F (36 C) 96.4 F (35.8 C)  TempSrc: Core (Comment) Core (Comment) Core (Comment) Core (Comment)  Resp: 35 22 26 28   Height:      Weight:      SpO2: 100% 100% 100% 100%    Exam:  General:  Pt is alert, not in acute distress  HEENT: No icterus, No thrush, oral mucosa moist  Cardiovascular: regular rate and rhythm, S1/S2 No murmur  Respiratory: clear to auscultation bilaterally   Abdomen: Soft, +Bowel sounds, non tender, non distended, no guarding  MSK: No LE edema, cyanosis or clubbing  Data Reviewed: Basic Metabolic Panel:  Recent Labs Lab 03/27/15 1650  03/28/15 1430 03/29/15 0234 03/30/15 0355 03/31/15 0530 04/01/15 0400  NA 135  < > 133* 134* 136 139 133*  K 3.5  < > 4.0 3.7 4.1 3.7 3.5  CL 101  < > 105 105 108 106 103  CO2 24  < > 22 23 23 23 25   GLUCOSE 74  < > 189* 127* 126* 163* 106*  BUN 11  < > 13 13 16 13 11   CREATININE 0.57  < > 0.52 0.53 0.55 0.49 0.33*  CALCIUM 8.1*  < > 7.6* 7.5* 7.2* 7.9* 7.6*  MG 1.7  --  1.8  --  1.9  --  1.7  PHOS 3.2  --   --   --  1.7*  --   --   < > = values in this interval not displayed. Liver Function  Tests:  Recent Labs Lab 03/28/15 0625 03/30/15 0355 04/01/15 0400  AST 148* 605* 167*  ALT 65* 303* 270*  ALKPHOS 337* 504* 560*  BILITOT 2.1* 1.0 1.0  PROT 5.9* 5.2* 5.7*  ALBUMIN 1.6* 1.3* 1.6*   No results for input(s): LIPASE, AMYLASE  in the last 168 hours. No results for input(s): AMMONIA in the last 168 hours. CBC:  Recent Labs Lab 03/27/15 1327 03/27/15 1643  03/28/15 1430 03/29/15 0234 03/30/15 0355 03/31/15 0530 04/01/15 0400  WBC 11.7* 12.4*  < > 10.6* 11.5* 9.5 14.9* 13.1*  NEUTROABS 9.8* 10.5*  --  9.3*  --   --   --   --   HGB 11.2* 12.5  < > 9.7* 9.3* 8.2* 10.3* 9.9*  HCT 32.4* 35.9*  < > 29.1* 27.3* 24.2* 30.2* 28.5*  MCV 92.3 93.0  < > 94.8 94.1 94.5 91.8 91.9  PLT 258 288  < > 221 216 194 235 201  < > = values in this interval not displayed. Cardiac Enzymes: No results for input(s): CKTOTAL, CKMB, CKMBINDEX, TROPONINI in the last 168 hours. BNP (last 3 results)  Recent Labs  03/27/15 1053  BNP 50.4    ProBNP (last 3 results) No results for input(s): PROBNP in the last 8760 hours.  CBG:  Recent Labs Lab 03/31/15 0820 03/31/15 1249 03/31/15 1307 03/31/15 1640 04/01/15 0725  GLUCAP 162* 177* 172* 166* 108*    Recent Results (from the past 240 hour(s))  MRSA PCR Screening     Status: None   Collection Time: 03/27/15  2:00 PM  Result Value Ref Range Status   MRSA by PCR NEGATIVE NEGATIVE Final    Comment:        The GeneXpert MRSA Assay (FDA approved for NASAL specimens only), is one component of a comprehensive MRSA colonization surveillance program. It is not intended to diagnose MRSA infection nor to guide or monitor treatment for MRSA infections.   Culture, blood (x 2)     Status: None (Preliminary result)   Collection Time: 03/27/15  5:09 PM  Result Value Ref Range Status   Specimen Description BLOOD RIGHT ARM  Final   Special Requests BOTTLES DRAWN AEROBIC AND ANAEROBIC 10CC  Final   Culture   Final    NO GROWTH 3  DAYS Performed at [facility]    Report Status PENDING  Incomplete  Culture, blood (x 2)     Status: None (Preliminary result)   Collection Time: 03/27/15  5:21 PM  Result Value Ref Range Status   Specimen Description BLOOD RIGHT ARM  Final   Special Requests BOTTLES DRAWN AEROBIC AND ANAEROBIC 10CC  Final   Culture   Final    NO GROWTH 3 DAYS Performed at [facility]    Report Status PENDING  Incomplete  Culture, respiratory (NON-Expectorated)     Status: None   Collection Time: 03/28/15  1:15 AM  Result Value Ref Range Status   Specimen Description TRACHEAL ASPIRATE  Final   Special Requests NONE  Final   Gram Stain   Final    NO WBC SEEN NO SQUAMOUS EPITHELIAL CELLS SEEN NO ORGANISMS SEEN Performed at [facility]    Culture   Final    NO GROWTH 2 DAYS Performed at [facility]    Report Status 03/30/2015 FINAL  Final  Pneumocystis smear by DFA     Status: None   Collection Time: 03/29/15  8:30 AM  Result Value Ref Range Status   Specimen Source-PJSRC TRACHEAL ASPIRATE  Final   Pneumocystis jiroveci Ag NEGATIVE  Final    Comment: Performed at [facility]     Studies: Dg Chest Port 1 View  03/31/2015   CLINICAL DATA:  Adult respiratory distress syndrome  EXAM: PORTABLE CHEST - 1 VIEW  COMPARISON:  March 30, 2015  FINDINGS: Endotracheal tube and nasogastric tube have been removed. Central catheter tip is in the superior vena cava. No pneumothorax. Widespread interstitial and patchy alveolar edema remain without change. Heart is upper normal in size with pulmonary vascularity within normal limits. No adenopathy. There is atherosclerotic change in the aorta.  IMPRESSION: No pneumothorax following removal of endotracheal tube and nasogastric tube. Central catheter position not appreciably changed. Widespread interstitial and alveolar edema remain. Appearance is consistent with ARDS. A degree of superimposed pneumonia cannot be  excluded. Both entities may exist concurrently. No new opacity compared to 1 day prior.   Electronically Signed   By: Lowella Grip III M.D.   On: 03/31/2015 06:58    Scheduled Meds:  Scheduled Meds: . antiseptic oral rinse  7 mL Mouth Rinse QID  . azithromycin  500 mg Intravenous Q24H  . cefTRIAXone (ROCEPHIN)  IV  1 g Intravenous Q24H  . chlorhexidine  15 mL Mouth Rinse BID  . dolutegravir  50 mg Oral Daily  . emtricitabine-tenofovir  1 tablet Oral Q2000  . heparin subcutaneous  5,000 Units Subcutaneous 3 times per day  . insulin aspart  0-20 Units Subcutaneous TID WC  . metoprolol tartrate  25 mg Oral BID  . pantoprazole  40 mg Oral Daily  . predniSONE  30 mg Oral Q breakfast  . sodium chloride  1,000 mL Intravenous Once  . sodium chloride  3 mL Intravenous Q12H   Continuous Infusions: . sodium chloride 10 mL/hr at 04/01/15 7829    Time spent on care of this patient: 40 min   [place], MD 04/01/2015, 10:28 AM  LOS: 5 days   [facility] Office  [phone] Pager - Text Page per [email or web address] If 7PM-7AM, please contact night-coverage [email or web address]

## 2015-04-02 LAB — BASIC METABOLIC PANEL
Anion gap: 7 (ref 5–15)
BUN: 9 mg/dL (ref 6–20)
CHLORIDE: 102 mmol/L (ref 101–111)
CO2: 26 mmol/L (ref 22–32)
CREATININE: 0.47 mg/dL (ref 0.44–1.00)
Calcium: 7.8 mg/dL — ABNORMAL LOW (ref 8.9–10.3)
GFR calc Af Amer: >60 mL/min (ref >60)
Glucose, Bld: 144 mg/dL — ABNORMAL HIGH (ref 65–99)
Potassium: 4.3 mmol/L (ref 3.5–5.1)
SODIUM: 135 mmol/L (ref 135–145)

## 2015-04-02 LAB — GLUCOSE, CAPILLARY
GLUCOSE-CAPILLARY: 134 mg/dL — AB (ref 65–99)
Glucose-Capillary: 164 mg/dL — ABNORMAL HIGH (ref 65–99)

## 2015-04-02 LAB — COMPREHENSIVE METABOLIC PANEL
ALT: 220 U/L — ABNORMAL HIGH (ref 14–54)
AST: 105 U/L — ABNORMAL HIGH (ref 15–41)
Albumin: 1.9 g/dL — ABNORMAL LOW (ref 3.5–5.0)
Alkaline Phosphatase: 510 U/L — ABNORMAL HIGH (ref 38–126)
Anion gap: 7 (ref 5–15)
BUN: 13 mg/dL (ref 6–20)
CO2: 27 mmol/L (ref 22–32)
CREATININE: 0.51 mg/dL (ref 0.44–1.00)
Calcium: 8.1 mg/dL — ABNORMAL LOW (ref 8.9–10.3)
Chloride: 105 mmol/L (ref 101–111)
GFR calc non Af Amer: >60 mL/min (ref >60)
GLUCOSE: 73 mg/dL (ref 65–99)
POTASSIUM: 2.9 mmol/L — AB (ref 3.5–5.1)
Sodium: 139 mmol/L (ref 135–145)
TOTAL PROTEIN: 6.3 g/dL — AB (ref 6.5–8.1)
Total Bilirubin: 1.2 mg/dL (ref 0.3–1.2)

## 2015-04-02 LAB — CBC
HCT: 32.7 % — ABNORMAL LOW (ref 36.0–46.0)
HEMOGLOBIN: 11.2 g/dL — AB (ref 12.0–15.0)
MCH: 32.1 pg (ref 26.0–34.0)
MCHC: 34.3 g/dL (ref 30.0–36.0)
MCV: 93.7 fL (ref 78.0–100.0)
Platelets: 202 10*3/uL (ref 150–400)
RBC: 3.49 MIL/uL — ABNORMAL LOW (ref 3.87–5.11)
RDW: 16.4 % — AB (ref 11.5–15.5)
WBC: 12.1 10*3/uL — ABNORMAL HIGH (ref 4.0–10.5)

## 2015-04-02 LAB — CULTURE, BLOOD (ROUTINE X 2)
CULTURE: NO GROWTH
Culture: NO GROWTH

## 2015-04-02 MED ORDER — PREDNISONE 10 MG PO TABS: ORAL_TABLET | ORAL | Status: DC

## 2015-04-02 MED ORDER — MAGIC MOUTHWASH W/LIDOCAINE
5.0000 mL | Freq: Three times a day (TID) | ORAL | Status: DC
  Administered 2015-04-02: 5 mL via ORAL
  Filled 2015-04-02 (×4): qty 5

## 2015-04-02 MED ORDER — POTASSIUM CHLORIDE CRYS ER 20 MEQ PO TBCR
40.0000 meq | EXTENDED_RELEASE_TABLET | ORAL | Status: AC
  Administered 2015-04-02 (×2): 40 meq via ORAL
  Filled 2015-04-02 (×2): qty 2

## 2015-04-02 MED ORDER — LIDOCAINE VISCOUS 2 % MT SOLN: OROMUCOSAL | Status: DC

## 2015-04-02 MED ORDER — METOPROLOL TARTRATE 50 MG PO TABS: 75.0000 mg | ORAL_TABLET | Freq: Two times a day (BID) | ORAL | Status: AC

## 2015-04-02 MED ORDER — METOPROLOL TARTRATE 50 MG PO TABS: 50.0000 mg | ORAL_TABLET | Freq: Two times a day (BID) | ORAL | Status: DC

## 2015-04-02 MED ORDER — POTASSIUM CHLORIDE 10 MEQ/100ML IV SOLN
10.0000 meq | INTRAVENOUS | Status: AC
  Administered 2015-04-02 (×4): 10 meq via INTRAVENOUS
  Filled 2015-04-02 (×4): qty 100

## 2015-04-02 MED ORDER — HYDROXYZINE HCL 25 MG PO TABS: 25.0000 mg | ORAL_TABLET | Freq: Three times a day (TID) | ORAL | Status: DC | PRN

## 2015-04-02 MED ORDER — HYDRALAZINE HCL 20 MG/ML IJ SOLN
10.0000 mg | Freq: Once | INTRAMUSCULAR | Status: AC
  Administered 2015-04-02: 10 mg via INTRAVENOUS
  Filled 2015-04-02: qty 1

## 2015-04-02 NOTE — Progress Notes (Signed)
[facility] is providing the following services: Rolling Walker  If patient discharges after hours, please call [phone].   Linward Headland 04/02/2015, 3:50 PM

## 2015-04-02 NOTE — Progress Notes (Signed)
PT Cancellation Note  Patient Details Name: Courtney Escobar MRN: [id number] DOB: Dec 12, 1955   Cancelled Treatment:    Reason Eval/Treat Not Completed: Other (comment) (patient up in room with OT. )   Claretha Cooper 04/02/2015, 5:05 PM

## 2015-04-02 NOTE — Care Management Note (Addendum)
Case Management Note  Patient Details  Name: Courtney Escobar MRN: [id number] Date of Birth: 1956/05/07  Subjective/Objective:        59 yo admitted with Respiratory failure. Hx of HIV            Action/Plan: From home  Expected Discharge Date:                  Expected Discharge Plan:  [place]  In-House Referral:     Discharge planning Services  CM Consult  Post Acute Care Choice:  Home Health Choice offered to:     DME Arranged:   [facility] DME Agency:   [facility]  HH Arranged:  PT [place] Agency:  [facility]  Status of Service:  Completed, signed off  Medicare Important Message Given:    Date Medicare IM Given:    Medicare IM give by:    Date Additional Medicare IM Given:    Additional Medicare Important Message give by:     If discussed at [place] of Stay Meetings, dates discussed:    Additional Comments: PT recommended HHPT. This CM spoke to pt at bedside to offer choice. Pt states her family has used [facility] in the past and she would like to use them. Pt states she sees Dr. Johnnye Sima at [facility] infectious disease clinic for her HIV. Order written for HHPT and [facility] rep given referral. Pt states she doesn't need any equipment at home. Lynnell Catalan, RN 04/02/2015, 11:58 AM

## 2015-04-02 NOTE — Discharge Summary (Addendum)
Physician Discharge Summary  Courtney Escobar ZOX:[id number] DOB: 01/25/1956 DOA: 03/27/2015  PCP: Courtney Rumpf, MD- she states that her PCP has released her and recommends that Dr Courtney Escobar be her PCP  Admit date: 03/27/2015 Discharge date: 04/02/2015  Time spent: 50 minutes  Recommendations for Outpatient Follow-up:  1. F/u with Dr Courtney Escobar- has appt for later this month 2. Follow BP and adjust treatment plan as needed 3. Will have HHPT  Discharge Condition: stable Diet recommendation: as tolerated   Discharge Diagnoses:  Principal Problem:   Acute respiratory failure with hypoxia Active Problems:   HIV (human immunodeficiency virus infection)   CAP (community acquired pneumonia)   Severe sepsis with septic shock   Leukocytosis   ARDS (adult respiratory distress syndrome)   IRIS (immune reconstitution inflammatory syndrome)   HTN   Anxiety  History of present illness:  Courtney Escobar is a 59 y.o. female with RA, recently diagnosed with HIV in 6/16 and started on HAART (CD 4 Was 11) which she was taking appropriately. She presented to the hospital with dyspnea, pulse ox of 88% and multifocal patchy infiltrates on CXR. Started on Rocephin, zithromax and Bacrtim. She required intubation on early AM 7/10. Solumedrol added for possible PCP.   Hospital Course:  Principal Problem:  Acute respiratory failure with hypoxia(ARDS) / CAP IRIS (immune reconstitution inflammatory syndrome) - vent dependant from 7/12 through 7/12- now 100 % on room air on exertion -resp failure suspected to be due to immune reconstitution syndrome by ID as recently started on HAART - PCP antigen negative - Bactrim stopped on 7/12 - she will complete a 7 day course of Rocephin and Zithromax x 7 today (recommended by ID) - Using steroids for IRIS- per pulm will need for total of 2 wks- started 7/10- taper ordered- stop date of 7/23- currently on prednisone 30 mg   Active Problems: HIV (human  immunodeficiency virus infection) - HAART, Bactrim and Zithromax per ID - HIV RNA pending- to be followed by ID as outpt  Severe sepsis with septic shock/ Lactic acidosis - pressors stopped on 7/11- lactic acid improved  Elevated LFTs - due to sepsis or Bactrim?- improving   Steroid induced hyperglycemia - sugars stable for past 24 hrs- not needing insulin - stop Insulin sliding scale  HTN-  - New start on  Metoprolol- increased to 75 BID just today  Anxiety - trail of Hydroxizine started   Procedures: Intubation Right IJ triple lumen  Consultations: PCCM, ID  Discharge Exam: Filed Weights   03/29/15 2000 03/30/15 0449 03/31/15 0330  Weight: 69.6 kg (153 lb 7 oz) 69.6 kg (153 lb 7 oz) 62.4 kg (137 lb 9.1 oz)   Filed Vitals:   04/02/15 1621  BP:   Pulse:   Temp: 98.5 F (36.9 C)  Resp:     General: AAO x 3, no distress Cardiovascular: RRR, no murmurs  Respiratory: clear to auscultation bilaterally GI: soft, non-tender, non-distended, bowel sound positive  Discharge Instructions You were cared for by a hospitalist during your hospital stay. If you have any questions about your discharge medications or the care you received while you were in the hospital after you are discharged, you can call the unit and asked to speak with the hospitalist on call if the hospitalist that took care of you is not available. Once you are discharged, your primary care physician will handle any further medical issues. Please note that NO REFILLS for any discharge medications will be authorized once you are discharged, as  it is imperative that you return to your primary care physician (or establish a relationship with a primary care physician if you do not have one) for your aftercare needs so that they can reassess your need for medications and monitor your lab values.      Discharge Instructions    Diet - low sodium heart healthy    Complete by:  As directed      Increase activity  slowly    Complete by:  As directed             Medication List    TAKE these medications        azithromycin 600 MG tablet  Commonly known as:  ZITHROMAX  Take 2 tablets (1,200 mg total) by mouth once a week.     dolutegravir 50 MG tablet  Commonly known as:  TIVICAY  Take 1 tablet (50 mg total) by mouth daily.     emtricitabine-tenofovir AF 200-25 MG per tablet  Commonly known as:  DESCOVY  Take 1 tablet by mouth daily.     fluocinonide ointment 0.05 %  Commonly known as:  LIDEX  Apply 1 application topically daily.     hydrOXYzine 25 MG tablet  Commonly known as:  ATARAX/VISTARIL  Take 1 tablet (25 mg total) by mouth 3 (three) times daily as needed for anxiety.     lidocaine 2 % solution  Commonly known as:  XYLOCAINE  Use a small amount and dab on to mouth sore with a Q tip every 4 hrs as needed for pain     metoprolol 50 MG tablet  Commonly known as:  LOPRESSOR  Take 1.5 tablets (75 mg total) by mouth 2 (two) times daily.     predniSONE 10 MG tablet  Commonly known as:  DELTASONE  30 mg x 2 days, 20 mg x 2 days, 10 mg x 2 days, 5 mg x 2 days     sulfamethoxazole-trimethoprim 400-80 MG per tablet  Commonly known as:  BACTRIM  Take 1 tablet by mouth 3 (three) times a week.       Allergies  Allergen Reactions  . Codeine     REACTION: itching      The results of significant diagnostics from this hospitalization (including imaging, microbiology, ancillary and laboratory) are listed below for reference.    Significant Diagnostic Studies: Dg Chest 2 View  03/27/2015   CLINICAL DATA:  Cough, upper back pain, HIV  EXAM: CHEST  2 VIEW  COMPARISON:  03/09/2015  FINDINGS: Multifocal patchy opacities bilaterally with a mid/lower lung predominance, worrisome for multifocal pneumonia.  No pleural effusion or pneumothorax.  The heart is normal in size.  Visualized osseous structures are within normal limits.  IMPRESSION: Multifocal patchy opacities bilaterally,  suspicious for multifocal pneumonia.   Electronically Signed   By: Julian Hy M.D.   On: 03/27/2015 11:49   Dg Chest 2 View  03/09/2015   CLINICAL DATA:  Weight loss, history of TB, former smoker  EXAM: CHEST  2 VIEW  COMPARISON:  11/21/2013  FINDINGS: Cardiomediastinal silhouette is unremarkable. No pulmonary edema. Mild perihilar bronchitic changes. Mild interstitial prominence bilateral lower lobes. Mild pneumonitis or chronic interstitial lung disease cannot be excluded. Clinical correlation is necessary. Further correlation with CT scan of the chest could be performed.  IMPRESSION: No pulmonary edema. Mild perihilar bronchitic changes. Mild interstitial prominence bilateral lower lobes. Mild pneumonitis or chronic interstitial lung disease cannot be excluded. Clinical correlation is necessary. Further correlation with  CT scan of the chest could be performed.   Electronically Signed   By: Lahoma Crocker M.D.   On: 03/09/2015 10:42   Ct Angio Chest Pe W/cm &/or Wo Cm  03/27/2015   CLINICAL DATA:  polyuria, esp. At night x ~3 months. She states she was recently dx with type II diabetes; however, has not received any medications for treatment of same. Her skin is normal, warm and dry and she hyperventilates when observed. At times when observed surreptitiously her respirations are normal. Sob with tachycardic progressive over a week  EXAM: CT ANGIOGRAPHY CHEST WITH CONTRAST  TECHNIQUE: Multidetector CT imaging of the chest was performed using the standard protocol during bolus administration of intravenous contrast. Multiplanar CT image reconstructions and MIPs were obtained to evaluate the vascular anatomy.  CONTRAST:  34mL OMNIPAQUE IOHEXOL 350 MG/ML SOLN  COMPARISON:  None.  FINDINGS: Left arm contrast injection. The SVC is patent. Right ventricle nondilated. Satisfactory opacification of pulmonary arteries noted, and there is no evidence of pulmonary emboli. Patent pulmonary veins bilaterally. Adequate  contrast opacification of the thoracic aorta with no evidence of dissection, aneurysm, or stenosis. There is bovine variant brachiocephalic arch anatomy without proximal stenosis.  Trace left pleural effusion. No pericardial effusion. Bilateral hilar, subcarinal, and precarinal adenopathy. Emphysematous changes most marked in the apices. Extensive airspace consolidation throughout both lower lobes left worse than right, with a patchy airspace in extensive alveolar opacities throughout the lingula and right middle lobe. There peripheral airspace and alveolar opacities in both upper lobes. Innumerable subcentimeter cystic lucencies scattered throughout both lungs primarily peripherally and in the bases. Lumbar spine and sternum intact. Visualized portions of upper abdomen unremarkable.  Review of the MIP images confirms the above findings.  IMPRESSION: 1. Extensive airspace opacities throughout both lungs involving bases worse than apices, left greater than right. Asymmetric edema, pneumonia, and a variety of inflammatory processes can give this appearance. 2. Bilateral hilar and mediastinal adenopathy, possibly reactive but nonspecific. 3. Negative for acute PE or thoracic aortic dissection.   Electronically Signed   By: Lucrezia Europe M.D.   On: 03/27/2015 12:47   Dg Chest Port 1 View  03/31/2015   CLINICAL DATA:  Adult respiratory distress syndrome  EXAM: PORTABLE CHEST - 1 VIEW  COMPARISON:  March 30, 2015  FINDINGS: Endotracheal tube and nasogastric tube have been removed. Central catheter tip is in the superior vena cava. No pneumothorax. Widespread interstitial and patchy alveolar edema remain without change. Heart is upper normal in size with pulmonary vascularity within normal limits. No adenopathy. There is atherosclerotic change in the aorta.  IMPRESSION: No pneumothorax following removal of endotracheal tube and nasogastric tube. Central catheter position not appreciably changed. Widespread interstitial and  alveolar edema remain. Appearance is consistent with ARDS. A degree of superimposed pneumonia cannot be excluded. Both entities may exist concurrently. No new opacity compared to 1 day prior.   Electronically Signed   By: Lowella Grip III M.D.   On: 03/31/2015 06:58   Dg Chest Port 1 View  03/30/2015   CLINICAL DATA:  ARDS.  EXAM: PORTABLE CHEST - 1 VIEW  FINDINGS: Endotracheal tube, NG tube, left IJ line in stable position. Unchanged bilateral diffuse airspace disease. Heart size normal. Tiny left pleural effusion cannot be excluded. No pneumothorax.  IMPRESSION: 1. Lines and tubes in stable position. 2. Persistent unchanged bilateral diffuse airspace disease .   Electronically Signed   By: Marcello Moores  Register   On: 03/30/2015 07:06   Dg  Chest Port 1 View  03/28/2015   CLINICAL DATA:  Acute respiratory failure with hypoxia. Severe sepsis was septic shock. HIV. On ventilator.  EXAM: PORTABLE CHEST - 1 VIEW  COMPARISON:  03/28/2015  FINDINGS: Support lines and tubes in appropriate position. Diffuse symmetric bilateral airspace disease shows no significant interval change. No evidence of pneumothorax or definite pleural effusion. Heart size remains within normal limits.  IMPRESSION: Diffuse bilateral airspace disease, without significant change.   Electronically Signed   By: Earle Gell M.D.   On: 03/28/2015 07:36   Dg Chest Port 1 View  03/28/2015   CLINICAL DATA:  Encounter for central line placement  EXAM: PORTABLE CHEST - 1 VIEW  COMPARISON:  Chest x-ray from yesterday  FINDINGS: New left IJ central line, tip at the upper SVC. No pneumothorax. New endotracheal tube with tip just below the clavicular heads. A orogastric tube reaches the stomach at least.  Unchanged widespread airspace disease. Normal heart size and stable mediastinal contours. No evidence of effusion.  IMPRESSION: 1. New tubes and central line are in good position. No pneumothorax. 2. Unchanged widespread airspace disease.    Electronically Signed   By: Monte Fantasia M.D.   On: 03/28/2015 02:18    Microbiology: Recent Results (from the past 240 hour(s))  MRSA PCR Screening     Status: None   Collection Time: 03/27/15  2:00 PM  Result Value Ref Range Status   MRSA by PCR NEGATIVE NEGATIVE Final    Comment:        The GeneXpert MRSA Assay (FDA approved for NASAL specimens only), is one component of a comprehensive MRSA colonization surveillance program. It is not intended to diagnose MRSA infection nor to guide or monitor treatment for MRSA infections.   Culture, blood (x 2)     Status: None   Collection Time: 03/27/15  5:09 PM  Result Value Ref Range Status   Specimen Description BLOOD RIGHT ARM  Final   Special Requests BOTTLES DRAWN AEROBIC AND ANAEROBIC 10CC  Final   Culture   Final    NO GROWTH 5 DAYS Performed at [facility]    Report Status 04/02/2015 FINAL  Final  Culture, blood (x 2)     Status: None   Collection Time: 03/27/15  5:21 PM  Result Value Ref Range Status   Specimen Description BLOOD RIGHT ARM  Final   Special Requests BOTTLES DRAWN AEROBIC AND ANAEROBIC 10CC  Final   Culture   Final    NO GROWTH 5 DAYS Performed at [facility]    Report Status 04/02/2015 FINAL  Final  Culture, respiratory (NON-Expectorated)     Status: None   Collection Time: 03/28/15  1:15 AM  Result Value Ref Range Status   Specimen Description TRACHEAL ASPIRATE  Final   Special Requests NONE  Final   Gram Stain   Final    NO WBC SEEN NO SQUAMOUS EPITHELIAL CELLS SEEN NO ORGANISMS SEEN Performed at [facility]    Culture   Final    NO GROWTH 2 DAYS Performed at [facility]    Report Status 03/30/2015 FINAL  Final  Pneumocystis smear by DFA     Status: None   Collection Time: 03/29/15  8:30 AM  Result Value Ref Range Status   Specimen Source-PJSRC TRACHEAL ASPIRATE  Final   Pneumocystis jiroveci Ag NEGATIVE  Final    Comment: Performed at [place]:  Basic Metabolic Panel:  Recent Labs Lab 03/27/15 1650  03/28/15 1430 03/29/15 0234 03/30/15 0355 03/31/15 0530 04/01/15 0400 04/02/15 0500  NA 135  < > 133* 134* 136 139 133* 139  K 3.5  < > 4.0 3.7 4.1 3.7 3.5 2.9*  CL 101  < > 105 105 108 106 103 105  CO2 24  < > 22 23 23 23 25 27   GLUCOSE 74  < > 189* 127* 126* 163* 106* 73  BUN 11  < > 13 13 16 13 11 13   CREATININE 0.57  < > 0.52 0.53 0.55 0.49 0.33* 0.51  CALCIUM 8.1*  < > 7.6* 7.5* 7.2* 7.9* 7.6* 8.1*  MG 1.7  --  1.8  --  1.9  --  1.7  --   PHOS 3.2  --   --   --  1.7*  --   --   --   < > = values in this interval not displayed. Liver Function Tests:  Recent Labs Lab 03/28/15 0625 03/30/15 0355 04/01/15 0400 04/02/15 0500  AST 148* 605* 167* 105*  ALT 65* 303* 270* 220*  ALKPHOS 337* 504* 560* 510*  BILITOT 2.1* 1.0 1.0 1.2  PROT 5.9* 5.2* 5.7* 6.3*  ALBUMIN 1.6* 1.3* 1.6* 1.9*   No results for input(s): LIPASE, AMYLASE in the last 168 hours. No results for input(s): AMMONIA in the last 168 hours. CBC:  Recent Labs Lab 03/27/15 1327 03/27/15 1643  03/28/15 1430 03/29/15 0234 03/30/15 0355 03/31/15 0530 04/01/15 0400 04/02/15 0500  WBC 11.7* 12.4*  < > 10.6* 11.5* 9.5 14.9* 13.1* 12.1*  NEUTROABS 9.8* 10.5*  --  9.3*  --   --   --   --   --   HGB 11.2* 12.5  < > 9.7* 9.3* 8.2* 10.3* 9.9* 11.2*  HCT 32.4* 35.9*  < > 29.1* 27.3* 24.2* 30.2* 28.5* 32.7*  MCV 92.3 93.0  < > 94.8 94.1 94.5 91.8 91.9 93.7  PLT 258 288  < > 221 216 194 235 201 202  < > = values in this interval not displayed. Cardiac Enzymes: No results for input(s): CKTOTAL, CKMB, CKMBINDEX, TROPONINI in the last 168 hours. BNP: BNP (last 3 results)  Recent Labs  03/27/15 1053  BNP 50.4    ProBNP (last 3 results) No results for input(s): PROBNP in the last 8760 hours.  CBG:  Recent Labs Lab 04/01/15 0725 04/01/15 1129 04/01/15 2148 04/02/15 0751 04/02/15 1314  GLUCAP 108* 174* 123* 134* 164*        SignedDebbe Odea, MD [facility] 04/02/2015, 4:49 PM

## 2015-04-02 NOTE — Progress Notes (Signed)
Occupational Therapy Treatment Patient Details Name: Courtney Escobar MRN: [id number] DOB: 07/05/1956 Today's Date: 04/02/2015    History of present illness Pt is a 59 year old female with hx of HIV admitted 7/9 for progressive dyspnea and intubated 7/10 for acute hypoxemic resp failure due to bilateral progressive infiltrate and extubated 7/12   OT comments  Making progress but has decreased activity tolerance and still unsteady with RW ambulating to bathroom.  Spoke to CM about RW for home  Follow Up Recommendations  Supervision/Assistance - 24 hour;Home health OT    Equipment Recommendations   (RW; pt has access to 3:1 commode)    Recommendations for Other Services      Precautions / Restrictions Precautions Precautions: Fall Restrictions Weight Bearing Restrictions: No       Mobility Bed Mobility           Sit to supine: Supervision   General bed mobility comments: watched lines  Transfers   Equipment used: Rolling walker (2 wheeled) Transfers: Sit to/from Stand Sit to Stand: Supervision         General transfer comment: cues for hand placement    Balance                                   ADL                           Toilet Transfer: Minimal assistance;Ambulation;BSC (unsteady but no LOB with RW)   Toileting- Clothing Manipulation and Hygiene: Set up;Sitting/lateral lean         General ADL Comments: ambulated to bathroom, HR 112-123.  She can use 3:1 that used to belong to mother in law.  Discussed over toilet when husband is home and next to bed if she is alone.  Also educated on energy conservation.  Pt with decreased activity tolerance      Vision                     Perception     Praxis      Cognition   Behavior During Therapy: WFL for tasks assessed/performed Overall Cognitive Status: Within Functional Limits for tasks assessed                       Extremity/Trunk Assessment               Exercises     Shoulder Instructions       General Comments      Pertinent Vitals/ Pain       Pain Assessment:  (c/o feeling stiff in general, not rated)  Home Living                                          Prior Functioning/Environment              Frequency Min 2X/week     Progress Toward Goals  OT Goals(current goals can now be found in the care plan section)  Progress towards OT goals: Progressing toward goals     Plan Discharge plan remains appropriate    Co-evaluation                 End of Session     Activity Tolerance Patient tolerated treatment  well   Patient Left in bed;with call bell/phone within reach;with nursing/sitter in room   Nurse Communication          Time: 1610-9604 OT Time Calculation (min): 23 min  Charges: OT General Charges $OT Visit: 1 Procedure OT Treatments $Self Care/Home Management : 8-22 mins  Courtney Escobar 04/02/2015, 2:30 PM  Lesle Chris, OTR/L [phone] 04/02/2015

## 2015-04-03 LAB — HIV-1 RNA QUANT-NO REFLEX-BLD
HIV 1 RNA Quant: 1200 copies/mL
LOG10 HIV-1 RNA: 3.079 {Log_copies}/mL

## 2015-04-07 ENCOUNTER — Encounter (HOSPITAL_COMMUNITY): Payer: Self-pay | Admitting: Emergency Medicine

## 2015-04-07 ENCOUNTER — Emergency Department (HOSPITAL_COMMUNITY)
Admission: EM | Admit: 2015-04-07 | Discharge: 2015-04-07 | Disposition: A | Discharge status: Home / Self Care | Payer: 59 | Attending: Emergency Medicine | Admitting: Emergency Medicine

## 2015-04-07 DIAGNOSIS — Z8659 Personal history of other mental and behavioral disorders: Secondary | ICD-10-CM | POA: Diagnosis not present

## 2015-04-07 DIAGNOSIS — Z79899 Other long term (current) drug therapy: Secondary | ICD-10-CM | POA: Insufficient documentation

## 2015-04-07 DIAGNOSIS — M1990 Unspecified osteoarthritis, unspecified site: Secondary | ICD-10-CM | POA: Diagnosis not present

## 2015-04-07 DIAGNOSIS — B20 Human immunodeficiency virus [HIV] disease: Secondary | ICD-10-CM | POA: Diagnosis not present

## 2015-04-07 DIAGNOSIS — E119 Type 2 diabetes mellitus without complications: Secondary | ICD-10-CM | POA: Diagnosis not present

## 2015-04-07 DIAGNOSIS — Z8719 Personal history of other diseases of the digestive system: Secondary | ICD-10-CM | POA: Insufficient documentation

## 2015-04-07 DIAGNOSIS — Z4802 Encounter for removal of sutures: Secondary | ICD-10-CM | POA: Insufficient documentation

## 2015-04-07 DIAGNOSIS — Z87891 Personal history of nicotine dependence: Secondary | ICD-10-CM | POA: Diagnosis not present

## 2015-04-07 NOTE — ED Notes (Signed)
Per pt, here to get sutures removed form neck

## 2015-04-07 NOTE — Discharge Instructions (Signed)

## 2015-04-07 NOTE — ED Provider Notes (Signed)
CSN: [id number]     Arrival date & time 04/07/15  1651 History   First MD Initiated Contact with Patient 04/07/15 1730     Chief Complaint  Patient presents with  . Suture / Staple Removal     (Consider location/radiation/quality/duration/timing/severity/associated sxs/prior Treatment) HPI   Blood pressure 119/82, pulse 93, temperature 98.3 F (36.8 C), temperature source Oral, resp. rate 18, SpO2 97 %.  Courtney Escobar is a 59 y.o. female presenting for suture removal to left neck. Patient had central line which was secured with sutures, she was discharged one week ago but the sutures were not removed. She denies any fever, chills, tenderness palpation or pain on the area where the sutures are. She's not tried to remove them herself.  Past Medical History  Diagnosis Date  . Arthritis   . Anxiety   . GERD (gastroesophageal reflux disease)     due to anxiety  . Hyperlipidemia   . Acquired immune deficiency syndrome   . Diabetes mellitus without complication   . HIV (human immunodeficiency virus infection)    Past Surgical History  Procedure Laterality Date  . Tonsillectomy    . Foot surgery    . Cholecystectomy     Family History  Problem Relation Age of Onset  . Hypertension Sister   . Arthritis Brother     ?rheumatoid arthritis, dermal changes  . Cancer Brother   . Tuberculosis Father    History  Substance Use Topics  . Smoking status: Former Smoker    Types: Cigarettes    Quit date: 05/22/1996  . Smokeless tobacco: Never Used  . Alcohol Use: No     Comment: none since 10/19/14   OB History    Gravida Para Term Preterm AB TAB SAB Ectopic Multiple Living   2 1 1  0 1 0 0 0 0 1     Review of Systems  10 systems reviewed and found to be negative, except as noted in the HPI.  Allergies  Codeine  Home Medications   Prior to Admission medications   Medication Sig Start Date End Date Taking? Authorizing Provider  dolutegravir (TIVICAY) 50 MG tablet Take 1  tablet (50 mg total) by mouth daily. 03/16/15  Yes Campbell Riches, MD  emtricitabine-tenofovir AF (DESCOVY) 200-25 MG per tablet Take 1 tablet by mouth daily. 03/16/15  Yes Campbell Riches, MD  fluconazole (DIFLUCAN) 100 MG tablet Take 100 mg by mouth daily. For 10 days 04/05/15 04/15/15 Yes Historical Provider, MD  fluocinonide ointment (LIDEX) 7.00 % Apply 1 application topically daily.  03/16/15  Yes Historical Provider, MD  hydrOXYzine (ATARAX/VISTARIL) 25 MG tablet Take 1 tablet (25 mg total) by mouth 3 (three) times daily as needed for anxiety. 04/02/15  Yes Debbe Odea, MD  lidocaine (XYLOCAINE) 2 % solution Use a small amount and dab on to mouth sore with a Q tip every 4 hrs as needed for pain 04/02/15  Yes Debbe Odea, MD  metoprolol (LOPRESSOR) 50 MG tablet Take 1.5 tablets (75 mg total) by mouth 2 (two) times daily. Patient taking differently: Take 50 mg by mouth 2 (two) times daily.  04/02/15  Yes Debbe Odea, MD  predniSONE (DELTASONE) 10 MG tablet 30 mg x 2 days, 20 mg x 2 days, 10 mg x 2 days, 5 mg x 2 days 04/02/15  Yes Debbe Odea, MD  sulfamethoxazole-trimethoprim (BACTRIM) 400-80 MG per tablet Take 1 tablet by mouth 3 (three) times a week. 03/16/15  Yes Campbell Riches, MD  azithromycin (ZITHROMAX) 600 MG tablet Take 2 tablets (1,200 mg total) by mouth once a week. Patient not taking: Reported on 04/07/2015 03/16/15   Campbell Riches, MD   BP 113/87 mmHg  Pulse 109  Temp(Src) 98.3 F (36.8 C) (Oral)  Resp 18  SpO2 100% Physical Exam  Constitutional: She is oriented to person, place, and time. She appears well-developed and well-nourished. No distress.  HENT:  Head: Normocephalic.  Eyes: Conjunctivae and EOM are normal.  Cardiovascular: Normal rate.   Pulmonary/Chest: Effort normal. No stridor.  Musculoskeletal: Normal range of motion.  Neurological: She is alert and oriented to person, place, and time.  Skin:  2 sutures, black material to left lower neck with no  surrounding cellulitis, discharge or tenderness palpation.  Psychiatric: She has a normal mood and affect.  Nursing note and vitals reviewed.   ED Course  SUTURE REMOVAL Date/Time: 04/08/2015 1:28 AM Performed by: Monico Blitz Authorized by: Monico Blitz Consent: Verbal consent obtained. Consent given by: patient Body area: head/neck Location details: neck Sutures Removed: 2 Facility: sutures placed in this facility Patient tolerance: Patient tolerated the procedure well with no immediate complications   (including critical care time) Labs Review Labs Reviewed - No data to display  Imaging Review No results found.   EKG Interpretation None      MDM   Final diagnoses:  Visit for suture removal    Filed Vitals:   04/07/15 1718 04/07/15 1842  BP: 113/87 119/82  Pulse: 109 93  Temp: 98.3 F (36.8 C)   TempSrc: Oral   Resp: 18 18  SpO2: 100% 97%    Courtney Escobar is a pleasant 59 y.o. female requesting suture removal, she had a central line with Trish occurred with sutures, the central line was removed in the ICU however apparently the sutures were not fully taken out. There is no signs of overlying infection.  This is a shared visit with the attending physician who personally evaluated the patient and agrees with the care plan.   Evaluation does not show pathology that would require ongoing emergent intervention or inpatient treatment. Pt is hemodynamically stable and mentating appropriately. Discussed findings and plan with patient/guardian, who agrees with care plan. All questions answered. Return precautions discussed and outpatient follow up given.       Monico Blitz, PA-C 04/08/15 0130  Wandra Arthurs, MD 04/08/15 1534

## 2015-04-12 ENCOUNTER — Other Ambulatory Visit: Payer: Self-pay | Admitting: Infectious Diseases

## 2015-04-12 ENCOUNTER — Telehealth: Payer: Self-pay | Admitting: *Deleted

## 2015-04-12 DIAGNOSIS — R269 Unspecified abnormalities of gait and mobility: Secondary | ICD-10-CM

## 2015-04-12 NOTE — Telephone Encounter (Signed)
Order placed in EPIC; their office will contact her directly.

## 2015-04-12 NOTE — Telephone Encounter (Signed)
Ok for PT 

## 2015-04-12 NOTE — Telephone Encounter (Signed)
Patient was recently in the hospital and seen by Dr. Johnnye Sima. Dr. Johnnye Sima is listed as PCP and she is requesting an order for physical therapy. Has follow up 04/26/15. Courtney Escobar

## 2015-04-13 ENCOUNTER — Other Ambulatory Visit: Payer: 59

## 2015-04-13 DIAGNOSIS — B20 Human immunodeficiency virus [HIV] disease: Secondary | ICD-10-CM

## 2015-04-13 LAB — COMPLETE METABOLIC PANEL WITH GFR
ALK PHOS: 683 U/L — AB (ref 33–130)
ALT: 112 U/L — ABNORMAL HIGH (ref 6–29)
AST: 54 U/L — ABNORMAL HIGH (ref 10–35)
Albumin: 3 g/dL — ABNORMAL LOW (ref 3.6–5.1)
BILIRUBIN TOTAL: 1.3 mg/dL — AB (ref 0.2–1.2)
BUN: 9 mg/dL (ref 7–25)
CHLORIDE: 100 meq/L (ref 98–110)
CO2: 27 mEq/L (ref 20–31)
CREATININE: 0.61 mg/dL (ref 0.50–1.05)
Calcium: 8.9 mg/dL (ref 8.6–10.4)
GFR, Est African American: >89 mL/min (ref >=60)
Glucose, Bld: 196 mg/dL — ABNORMAL HIGH (ref 65–99)
Potassium: 3.6 mEq/L (ref 3.5–5.3)
SODIUM: 136 meq/L (ref 135–146)
TOTAL PROTEIN: 7 g/dL (ref 6.1–8.1)

## 2015-04-14 LAB — CBC WITH DIFFERENTIAL/PLATELET
BASOS ABS: 0.1 10*3/uL (ref 0.0–0.1)
Basophils Relative: 1 % (ref 0–1)
EOS ABS: 0.2 10*3/uL (ref 0.0–0.7)
EOS PCT: 3 % (ref 0–5)
HCT: 34.4 % — ABNORMAL LOW (ref 36.0–46.0)
HEMOGLOBIN: 11.9 g/dL — AB (ref 12.0–15.0)
Lymphocytes Relative: 25 % (ref 12–46)
Lymphs Abs: 1.5 10*3/uL (ref 0.7–4.0)
MCH: 32.9 pg (ref 26.0–34.0)
MCHC: 34.6 g/dL (ref 30.0–36.0)
MCV: 95 fL (ref 78.0–100.0)
MPV: 9.5 fL (ref 8.6–12.4)
Monocytes Absolute: 0.5 10*3/uL (ref 0.1–1.0)
Monocytes Relative: 8 % (ref 3–12)
NEUTROS ABS: 3.7 10*3/uL (ref 1.7–7.7)
NEUTROS PCT: 63 % (ref 43–77)
Platelets: 314 10*3/uL (ref 150–400)
RBC: 3.62 MIL/uL — ABNORMAL LOW (ref 3.87–5.11)
RDW: 15.6 % — ABNORMAL HIGH (ref 11.5–15.5)
WBC: 5.9 10*3/uL (ref 4.0–10.5)

## 2015-04-14 LAB — T-HELPER CELL (CD4) - (RCID CLINIC ONLY)
CD4 T CELL ABS: 50 /uL — AB (ref 400–2700)
CD4 T CELL HELPER: 3 % — AB (ref 33–55)

## 2015-04-14 LAB — PATHOLOGIST SMEAR REVIEW

## 2015-04-15 LAB — HIV-1 RNA QUANT-NO REFLEX-BLD
HIV 1 RNA Quant: <20 copies/mL (ref <20)
HIV-1 RNA Quant, Log: <1.3 {Log} (ref <1.30)

## 2015-04-16 ENCOUNTER — Other Ambulatory Visit: Payer: Self-pay | Admitting: Family Medicine

## 2015-04-16 ENCOUNTER — Ambulatory Visit: Payer: 59 | Attending: Infectious Diseases | Admitting: Rehabilitative and Restorative Service Providers"

## 2015-04-16 DIAGNOSIS — M6281 Muscle weakness (generalized): Secondary | ICD-10-CM | POA: Insufficient documentation

## 2015-04-16 DIAGNOSIS — R269 Unspecified abnormalities of gait and mobility: Secondary | ICD-10-CM | POA: Insufficient documentation

## 2015-04-16 DIAGNOSIS — M21372 Foot drop, left foot: Secondary | ICD-10-CM

## 2015-04-16 NOTE — Therapy (Signed)
[facility] [address] [place], [place], [address] Phone: [phone]   Fax:  [phone]  Physical Therapy Evaluation  Patient Details  Name: Courtney Escobar MRN: [id number] Date of Birth: 1956/05/22 Referring Provider:  Campbell Riches, MD  Encounter Date: 04/16/2015      PT End of Session - 04/16/15 1438    Visit Number 1   Number of Visits 16   Date for PT Re-Evaluation 06/17/15   Authorization Type 24 visit limit    PT Start Time 1320   PT Stop Time 1405   PT Time Calculation (Courtney) 45 Courtney   Equipment Utilized During Treatment Gait belt   Activity Tolerance Patient tolerated treatment well   Behavior During Therapy [facility] for tasks assessed/performed      Past Medical History  Diagnosis Date  . Arthritis   . Anxiety   . GERD (gastroesophageal reflux disease)     due to anxiety  . Hyperlipidemia   . Acquired immune deficiency syndrome   . Diabetes mellitus without complication   . HIV (human immunodeficiency virus infection)     Past Surgical History  Procedure Laterality Date  . Tonsillectomy    . Foot surgery    . Cholecystectomy      There were no vitals filed for this visit.  Visit Diagnosis:  Abnormality of gait  Generalized muscle weakness      Subjective Assessment - 04/16/15 1326    Subjective The patient was hospitalized 03/27/2015 to 04/02/2015 for pneumonia.  The patient is using RW when out of her home and is walking with no device for short indoor distances.  The patient reports onset of L foot drop 9 days ago (last Wednesday).  She did not notice it when she was in the hospital.  She saw her primary care MD for post hospital f/u and had some evaluation of foot drop.   Pertinent History Recent dx of HIV with initiation of treatment.   Patient Stated Goals Return to walking without the walker, "get my strength back.".   Currently in Pain? No/denies            [facility] PT  Assessment - 04/16/15 1329    Assessment   Medical Diagnosis pneumonia with weakness   Onset Date/Surgical Date --  03/2015   Prior Therapy acute care, 1 visit of [facility] PT   Precautions   Precautions Fall   Balance Screen   Has the patient fallen in the past 6 months Yes   How many times? 1  while wearing flip flops to beauty shop (tripped)   Has the patient had a decrease in activity level because of a fear of falling?  Yes   Is the patient reluctant to leave their home because of a fear of falling?  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   Observation/Other Assessments  Focus on Therapeutic Outcomes (FOTO)  46%   Sensation   Light Touch Impaired Detail   Light Touch Impaired Details Impaired LLE  tingling in toes and dorsal aspect of L foot   ROM / Strength   AROM / PROM / Strength AROM;Strength   Strength   Overall Strength Comments R hip flexion 4-/5, L hip flexion 4+/5, knee flexion/extension bilaterally 4/5, R ankle DF 3+/5, L ankle dorsiflexion 1/5   Bed Mobility   Bed Mobility --  independent   Transfers   Transfers --  independent sit<>stand   Ambulation/Gait   Ambulation/Gait Yes   Ambulation/Gait Assistance 5: Supervision   Ambulation Distance (Feet) 200 Feet   Assistive device Rolling walker   Gait Pattern Left steppage;Poor foot clearance - left  L foot drop   Gait velocity 2.51   Standardized Balance Assessment    Standardized Balance Assessment Berg Balance Test;Timed Up and Go Test   Berg Balance Test   Sit to Stand Able to stand without using hands and stabilize independently   Standing Unsupported Able to stand safely 2 minutes   Sitting with Back Unsupported but Feet Supported on Floor or Stool Able to sit safely and securely 2 minutes   Stand to Sit Sits safely with minimal use of hands   Transfers Able to transfer safely, minor use of hands   Standing Unsupported with Eyes Closed Able to stand 10 seconds safely   Standing Ubsupported with Feet Together Able to place feet together independently and stand 1 minute safely   From Standing, Reach Forward with Outstretched Arm Can reach confidently >25 cm (10")   From Standing Position, Pick up Object from Floor Able to pick up shoe safely and easily   From Standing Position, Turn to Look Behind Over each Shoulder Looks behind from both sides and weight shifts well   Turn 360 Degrees Needs close supervision or verbal cueing   Standing Unsupported, Alternately Place Feet on Step/Stool Able to complete >2 steps/needs minimal assist   Standing Unsupported, One Foot in Front Able to place foot tandem independently and hold 30 seconds   Standing on One Leg Able to lift leg independently and hold 5-10 seconds   Total Score 49/56      THERAPEUTIC EXERCISE: Heel cord stretch and toe flexion/extension.      PT Education - 04/16/15 1437    Education provided Yes   Education Details HEP: heel cord stretch, toe flexion/extension   Person(s) Educated Patient   Methods Explanation;Demonstration;Handout   Comprehension Verbalized understanding;Returned demonstration          PT Short Term Goals - 04/16/15 1600    PT SHORT TERM GOAL #1   Title The patient will be independent with HEP for LE strengthening and general mobility.   Baseline Target date 05/17/2015   Time 4   Period Weeks   PT SHORT TERM GOAL #2   Title The patient will trial L AFO and  have orthotics consult if needed for L foot drop.   Baseline Target date 05/17/2015   Time 4   Period Weeks   PT SHORT TERM GOAL #3   Title The patient will ambulate household distances x 300 ft without RW modified indep.   Baseline Target date 05/17/2015   Time 4   Period Weeks   PT SHORT TERM GOAL #4   Title The patient will improve gait speed from 2.51 ft/sec up to 2.9 ft/sec for improved community mobility.   Baseline Target date  05/17/2015   Time 4   Period Weeks   PT SHORT TERM GOAL #5   Title The patient will improve R ankle dorsiflexion strength to 4/5 (from 3+/5 baseline).   Baseline Target date 05/17/2015   Time 4   Period Weeks         Plan - 04/17/15 1791    Clinical Impression Statement The patient is a 59 yo female with recent dx of HIV and hospitalization due to pneumonia.  She presents to outpatient physical therapy today with new onset L ankle weakness/foot drop that began after d/c from the hospital.  The patient has sensory changes in L foot described as numbness and tingling and motor changes of 1/5 ankle DF.  She denies any sensory changes following dermatomal pattern in the L LE.  She has a nodule in the lateral L ankle (proximal and medial to the lateral malleolus) that is under the skin and approximately the size of a quarter.  She also has R ankle weakness (3+/5) for dorsiflexion, however no sensory changes R foot at this time.   Pt will benefit from skilled therapeutic intervention in order to improve on the following deficits Abnormal gait;Decreased balance;Decreased activity tolerance;Decreased endurance;Impaired sensation;Decreased mobility;Decreased strength;Difficulty walking   Rehab Potential Good   PT Frequency 2x / week   PT Duration 8 weeks   PT Treatment/Interventions Stair training;Functional mobility training;Gait training;Neuromuscular re-education;Manual techniques;Therapeutic activities;Therapeutic exercise;Balance training;Electrical  Stimulation;Patient/family education;Orthotic Fit/Training   PT Next Visit Plan Check heel cord stretches (possibly perform in standing), try L AFO ? toe off vs. ottobock?, provide further LE strengthening for HEP, endurance activities   Consulted and Agree with Plan of Care Patient;Family member/caregiver   Family Member Consulted husband       Problem List Patient Active Problem List   Diagnosis Date Noted  . ARDS (adult respiratory distress syndrome)   . IRIS (immune reconstitution inflammatory syndrome)   . Acute respiratory failure with hypoxia 03/28/2015  . CAP (community acquired pneumonia) 03/27/2015  . Severe sepsis with septic shock 03/27/2015  . Leukocytosis 03/27/2015  . HIV (human immunodeficiency virus infection)    Thank you for the referral of this patient. Rudell Cobb, MPT  [place], PT 04/17/2015, 7:15 AM  [facility] [address] [place], [place], [address] Phone: [phone]   Fax:  [phone]

## 2015-04-16 NOTE — Patient Instructions (Signed)
Gastroc / Heel Cord Stretch - Seated With Towel   Sit in a chair with foot resting on another chair, towel around ball of foot. Gently pull foot in toward body, stretching heel cord and calf. Hold for _20-30__ seconds. Repeat _3__ times. Do _2__ times per day.  Copyright  VHI. All rights reserved.   MOVE TOES UP AND DOWN X 10 REPS

## 2015-04-18 ENCOUNTER — Ambulatory Visit
Admission: RE | Admit: 2015-04-18 | Discharge: 2015-04-18 | Disposition: A | Discharge status: Home / Self Care | Payer: 59 | Source: Ambulatory Visit | Attending: Family Medicine | Admitting: Family Medicine

## 2015-04-18 DIAGNOSIS — M21372 Foot drop, left foot: Secondary | ICD-10-CM

## 2015-04-20 ENCOUNTER — Telehealth: Payer: Self-pay | Admitting: *Deleted

## 2015-04-20 NOTE — Telephone Encounter (Addendum)
Patient walked into clinic with FMLA forms from her recent hospitalization that needed to be filled out by 04/22/15 in order to keep her job. They were given to the hospitalist and never filled out. Her follow up with Dr. Johnnye Sima is 04/26/15. Dr. Linus Salmons signed  the form and she was written out of work until 04/26/15. Dr. Johnnye Sima saw the patient in the hospital. Form faxed, confirmation received and copies put up front to be scanned. Myrtis Hopping

## 2015-04-22 ENCOUNTER — Encounter: Payer: Self-pay | Admitting: Physical Therapy

## 2015-04-22 ENCOUNTER — Ambulatory Visit: Payer: 59 | Attending: Infectious Diseases | Admitting: Rehabilitative and Restorative Service Providers"

## 2015-04-22 DIAGNOSIS — M6281 Muscle weakness (generalized): Secondary | ICD-10-CM | POA: Diagnosis present

## 2015-04-22 DIAGNOSIS — R269 Unspecified abnormalities of gait and mobility: Secondary | ICD-10-CM

## 2015-04-22 NOTE — Therapy (Signed)
[facility] [address] [place], [place], [address] Phone: [phone]   Fax:  [phone]  Physical Therapy Treatment  Patient Details  Name: Courtney Escobar MRN: [id number] Date of Birth: [birth year] Referring Provider:  Campbell Riches, MD  Encounter Date: 04/22/2015      PT End of Session - 04/22/15 0851    Visit Number 2   Number of Visits 16   Date for PT Re-Evaluation 06/17/15   Authorization Type 24 visit limit    PT Start Time 0849   PT Stop Time 0931   PT Time Calculation (min) 42 min   Equipment Utilized During Treatment Gait belt   Activity Tolerance Patient tolerated treatment well   Behavior During Therapy [facility] for tasks assessed/performed      Past Medical History  Diagnosis Date  . Arthritis   . Anxiety   . GERD (gastroesophageal reflux disease)     due to anxiety  . Hyperlipidemia   . Acquired immune deficiency syndrome   . Diabetes mellitus without complication   . HIV (human immunodeficiency virus infection)     Past Surgical History  Procedure Laterality Date  . Tonsillectomy    . Foot surgery    . Cholecystectomy      There were no vitals filed for this visit.  Visit Diagnosis:  Abnormality of gait  Generalized muscle weakness      Subjective Assessment - 04/22/15 0850    Subjective No new complaints. No falls or pain to report. Reports doing HEP at home.   Currently in Pain? No/denies   Pain Score 0-No pain       Gait: Ambulation with L ottobock AFO for improved gait mechanics and L foot clearance x 230 ft indoor surfaces with supervision. Level and unlevel community gait without device with CGA to supervision with L AFO x 800 ft nonstop with patient reporting significant improvement in endurance and tolerance to activity using AFO   THERAPEUTIC EXERCISE: SLR for hip flexion bilaterally x10 reps bilat Sidelying hip abduction x 10 reps bilat Prone hip extension x 10  reps bilat Prone knee flexion x 5 reps bilat Seated ankle DF/PF Provided activities for LE strengthening HEP      PT Education - 04/22/15 1309    Education provided Yes   Education Details HEP: SLR supine, hip abduction, hip extension prone, ankle DF/PF   Person(s) Educated Patient   Methods Explanation;Demonstration;Handout   Comprehension Returned demonstration;Verbalized understanding          PT Short Term Goals - 04/16/15 1600    PT SHORT TERM GOAL #1   Title The patient will be independent with HEP for LE strengthening and general mobility.   Baseline Target date 05/17/2015   Time 4   Period Weeks   PT SHORT TERM GOAL #2   Title The patient will trial L AFO and have orthotics consult if needed for L foot drop.   Baseline Target date 05/17/2015   Time 4   Period Weeks   PT SHORT TERM GOAL #3   Title The patient will ambulate household distances x 300 ft without RW modified indep.   Baseline Target date 05/17/2015   Time 4   Period Weeks   PT SHORT TERM GOAL #4   Title The patient will improve gait speed from 2.51 ft/sec up to 2.9 ft/sec for improved community mobility.   Baseline Target date 05/17/2015   Time 4   Period Weeks   PT  SHORT TERM GOAL #5   Title The patient will improve R ankle dorsiflexion strength to 4/5 (from 3+/5 baseline).   Baseline Target date 05/17/2015   Time 4   Period Weeks           PT Long Term Goals - 04/17/15 0656    PT LONG TERM GOAL #1   Title The patient will verbalize understanding of post d/c wellness plan in community.   Baseline Target date 06/17/2015   Time 8   Period Weeks   PT LONG TERM GOAL #2   Title The patient will negotiate community surfaces without a device x 1000 ft with L AFO modified indep.   Baseline Target date 06/17/2015   Time 8   Period Weeks   PT LONG TERM GOAL #3   Title The patient will negotiate 4 steps with reciprocal pattern without a handrail using L AFO modified indep.   Baseline Target date  06/17/2015   Time 8   Period Weeks   PT LONG TERM GOAL #4   Title The patient will improve Berg from 49/56 up to 52/56 to demo improving high level balance.   Baseline Target date 06/17/2015   Time 8   Period Weeks   PT LONG TERM GOAL #5   Title The patient will improve functional status score from 46% up to > or equal to 60% for improved subjective perception of mobility.   Baseline Target date 06/17/2015   Time 8   Period Weeks           Plan - 04/22/15 1309    Clinical Impression Statement The patient has significant improvement in limited community visits with L AFO use.  Patient supervision for community gait in parking lot, up/down curbs and through [employer] with L AFO donned.  PT to obtain order.   PT Next Visit Plan endurance activities, check HEP, ottobock AFO   Consulted and Agree with Plan of Care Patient        Problem List Patient Active Problem List   Diagnosis Date Noted  . ARDS (adult respiratory distress syndrome)   . IRIS (immune reconstitution inflammatory syndrome)   . Acute respiratory failure with hypoxia 03/28/2015  . CAP (community acquired pneumonia) 03/27/2015  . Severe sepsis with septic shock 03/27/2015  . Leukocytosis 03/27/2015  . HIV (human immunodeficiency virus infection)     [place], PT 04/22/2015, 1:11 PM  [facility] [address] [place], [place], [address] Phone: [phone]   Fax:  [phone]

## 2015-04-22 NOTE — Patient Instructions (Signed)
Straight Leg Raise   Tighten stomach and slowly raise locked right leg _8-10___ inches from floor. Repeat _15___ times per set. Do __1-2__ sets per session. Do _2___ sessions per day.  http://orth.exer.us/1103   Copyright  VHI. All rights reserved.   ABDUCTION: Side-Lying (Active)   Lie on left side, top leg straight. Raise top leg as far as possible.No weight to begin. Complete _1-2__ sets of __10-15_ repetitions. Perform _2__ sessions per day.  http://gtsc.exer.us/95   Copyright  VHI. All rights reserved.  HIP / KNEE: Extension - Prone   Squeeze glutes. Raise leg up. Keep knee straight. __10-15_ reps per set, _2__ sessions per day.   Copyright  VHI. All rights reserved.  Ankle Bend: Dorsiflexion / Plantar Flexion, Sitting   Sit with feet on floor *do left leg on its own- may have to move leg further away from you to do it.  Point toes up, keeping both heels on floor. Then press toes to floor raising heels. Hold each position _3__ seconds. Repeat _10__ times per session. Do _2__ sessions per day.  Copyright  VHI. All rights reserved.

## 2015-04-23 ENCOUNTER — Ambulatory Visit: Payer: 59 | Admitting: Physical Therapy

## 2015-04-23 ENCOUNTER — Telehealth: Payer: Self-pay | Admitting: Rehabilitative and Restorative Service Providers"

## 2015-04-23 ENCOUNTER — Other Ambulatory Visit: Payer: Self-pay | Admitting: Internal Medicine

## 2015-04-23 DIAGNOSIS — M6281 Muscle weakness (generalized): Secondary | ICD-10-CM

## 2015-04-23 DIAGNOSIS — R269 Unspecified abnormalities of gait and mobility: Secondary | ICD-10-CM

## 2015-04-23 NOTE — Therapy (Signed)
[facility] [address] [place], [place], [address] Phone: [phone]   Fax:  [phone]  Physical Therapy Treatment  Patient Details  Name: Courtney Escobar MRN: [id number] Date of Birth: 06/17/1956 Referring Provider:  Campbell Riches, MD  Encounter Date: 04/23/2015      PT End of Session - 04/23/15 0809    Visit Number 3   Number of Visits 16   Date for PT Re-Evaluation 06/17/15   Authorization Type 24 visit limit    PT Start Time 0806   PT Stop Time 0845   PT Time Calculation (min) 39 min   Equipment Utilized During Treatment Gait belt   Activity Tolerance Patient tolerated treatment well   Behavior During Therapy [facility] for tasks assessed/performed      Past Medical History  Diagnosis Date  . Arthritis   . Anxiety   . GERD (gastroesophageal reflux disease)     due to anxiety  . Hyperlipidemia   . Acquired immune deficiency syndrome   . Diabetes mellitus without complication   . HIV (human immunodeficiency virus infection)     Past Surgical History  Procedure Laterality Date  . Tonsillectomy    . Foot surgery    . Cholecystectomy      There were no vitals filed for this visit.  Visit Diagnosis:  Abnormality of gait  Generalized muscle weakness      Subjective Assessment - 04/22/15 0850    Subjective No new complaints. No falls or pain to report. Reports doing HEP at home.   Currently in Pain? No/denies   Pain Score 0-No pain     Treatment: Exercises Pt performed exercises issued at last session Scifit level 2.5 x 8 minutes with goals rpm >/= 40 for strengthening and activity tolerance.  Gait with left walk on (anterior) Ottobock brace >/= 1200 feet on indoor level and outdoor unlevel (gravel, grass, pavement) without AD, supervision. Cues on posture. Occasional foot scuffing noted on outdoor surfaces on inclines, no loss of balance noted.           PT Education - 04/22/15 1309     Education provided Yes   Education Details HEP: SLR supine, hip abduction, hip extension prone, ankle DF/PF   Person(s) Educated Patient   Methods Explanation;Demonstration;Handout   Comprehension Returned demonstration;Verbalized understanding          PT Short Term Goals - 04/16/15 1600    PT SHORT TERM GOAL #1   Title The patient will be independent with HEP for LE strengthening and general mobility.   Baseline Target date 05/17/2015   Time 4   Period Weeks   PT SHORT TERM GOAL #2   Title The patient will trial L AFO and have orthotics consult if needed for L foot drop.   Baseline Target date 05/17/2015   Time 4   Period Weeks   PT SHORT TERM GOAL #3   Title The patient will ambulate household distances x 300 ft without RW modified indep.   Baseline Target date 05/17/2015   Time 4   Period Weeks   PT SHORT TERM GOAL #4   Title The patient will improve gait speed from 2.51 ft/sec up to 2.9 ft/sec for improved community mobility.   Baseline Target date 05/17/2015   Time 4   Period Weeks   PT SHORT TERM GOAL #5   Title The patient will improve R ankle dorsiflexion strength to 4/5 (from 3+/5 baseline).   Baseline Target date  05/17/2015   Time 4   Period Weeks           PT Long Term Goals - 04/17/15 9458    PT LONG TERM GOAL #1   Title The patient will verbalize understanding of post d/c wellness plan in community.   Baseline Target date 06/17/2015   Time 8   Period Weeks   PT LONG TERM GOAL #2   Title The patient will negotiate community surfaces without a device x 1000 ft with L AFO modified indep.   Baseline Target date 06/17/2015   Time 8   Period Weeks   PT LONG TERM GOAL #3   Title The patient will negotiate 4 steps with reciprocal pattern without a handrail using L AFO modified indep.   Baseline Target date 06/17/2015   Time 8   Period Weeks   PT LONG TERM GOAL #4   Title The patient will improve Berg from 49/56 up to 52/56 to demo improving high level  balance.   Baseline Target date 06/17/2015   Time 8   Period Weeks   PT LONG TERM GOAL #5   Title The patient will improve functional status score from 46% up to > or equal to 60% for improved subjective perception of mobility.   Baseline Target date 06/17/2015   Time 8   Period Weeks           Plan - 04/23/15 0810    Clinical Impression Statement Pt continues to demo improved foot clearance with the use of the walk on ottobock brace. Primary PT sent MD order request this am to obtain a brace for pt. Pt making steady progress toward goals.    Pt will benefit from skilled therapeutic intervention in order to improve on the following deficits Abnormal gait;Decreased balance;Decreased activity tolerance;Decreased endurance;Impaired sensation;Decreased mobility;Decreased strength;Difficulty walking   Rehab Potential Good   PT Frequency 2x / week   PT Treatment/Interventions Stair training;Functional mobility training;Gait training;Neuromuscular re-education;Manual techniques;Therapeutic activities;Therapeutic exercise;Balance training;Electrical Stimulation;Patient/family education;Orthotic Fit/Training   PT Next Visit Plan endurance activities,  ottobock AFO with gait- trail on ramp, curbs and stairs as well, balance activities   Consulted and Agree with Plan of Care Patient        Problem List Patient Active Problem List   Diagnosis Date Noted  . ARDS (adult respiratory distress syndrome)   . IRIS (immune reconstitution inflammatory syndrome)   . Acute respiratory failure with hypoxia 03/28/2015  . CAP (community acquired pneumonia) 03/27/2015  . Severe sepsis with septic shock 03/27/2015  . Leukocytosis 03/27/2015  . HIV (human immunodeficiency virus infection)     Willow Ora 04/23/2015, 9:07 AM  Willow Ora, PTA, [facility] [address] [place], [place] [address] [phone] 04/23/2015, 9:07 AM

## 2015-04-23 NOTE — Telephone Encounter (Signed)
Please submit order in EPIC for L ankle foot orthoses (Ottobock walk on) to improve L foot clearance during ambulation. Thank you, [place], [facility] [address] [place], [place]  [address] Phone:  [phone] Fax:  [phone]

## 2015-04-23 NOTE — Telephone Encounter (Signed)
Hi christina, not sure how to order the orthotic. Is it under a consult request?

## 2015-04-26 ENCOUNTER — Telehealth: Payer: Self-pay | Admitting: *Deleted

## 2015-04-26 ENCOUNTER — Ambulatory Visit (INDEPENDENT_AMBULATORY_CARE_PROVIDER_SITE_OTHER): Payer: 59 | Admitting: Infectious Diseases

## 2015-04-26 ENCOUNTER — Encounter: Payer: Self-pay | Admitting: Infectious Diseases

## 2015-04-26 VITALS — BP 128/55 | HR 88 | Temp 97.9°F | Wt 135.0 lb

## 2015-04-26 DIAGNOSIS — A419 Sepsis, unspecified organism: Secondary | ICD-10-CM

## 2015-04-26 DIAGNOSIS — R600 Localized edema: Secondary | ICD-10-CM | POA: Diagnosis not present

## 2015-04-26 DIAGNOSIS — R29898 Other symptoms and signs involving the musculoskeletal system: Secondary | ICD-10-CM | POA: Insufficient documentation

## 2015-04-26 DIAGNOSIS — Z21 Asymptomatic human immunodeficiency virus [HIV] infection status: Secondary | ICD-10-CM

## 2015-04-26 DIAGNOSIS — J9601 Acute respiratory failure with hypoxia: Secondary | ICD-10-CM

## 2015-04-26 DIAGNOSIS — B20 Human immunodeficiency virus [HIV] disease: Secondary | ICD-10-CM

## 2015-04-26 DIAGNOSIS — M2142 Flat foot [pes planus] (acquired), left foot: Secondary | ICD-10-CM

## 2015-04-26 DIAGNOSIS — D893 Immune reconstitution syndrome: Secondary | ICD-10-CM

## 2015-04-26 NOTE — Assessment & Plan Note (Signed)
Her CD4 has increased, her HIV RNA has become undetectable. Hopefully she will be able to come off bactrim/azithro.  Offered to test her husband (refused).  Expect she will gain weight.  rtc in 6 weeks.

## 2015-04-26 NOTE — Assessment & Plan Note (Signed)
She is off steroids,  She is doing well.  Hopefully resolved now that she is acclamated to ART

## 2015-04-26 NOTE — Assessment & Plan Note (Signed)
Suspect her bump on her L ankle is contusion. WIll ask her PCP to f/u and consider MRI if not improving.

## 2015-04-26 NOTE — Assessment & Plan Note (Signed)
Resolved, related to IRIS.

## 2015-04-26 NOTE — Progress Notes (Signed)
Subjective:    Patient ID: Courtney Escobar, female    DOB: 06/11/1956, 59 y.o.   MRN: [id number]  HPI 59 yo F who was seen previously in ID for LTBI in 2011 (HIV - then) as she was being screened for embrel (off since 05-2014). Her father had TB. She was treated with INH at that time. She also has hx of RA, elevated lipids, chronic hepatitis (elevated alk phos, MR abd 02-27-15 normal). Has had 2 negative liver Bx per pt.  She is married and has only been sexually active with one other person ~5 yr ago when she and her husband separated.  She was seen by her PCP 6-21 and had Glc of 200 and was noted to have new HIV+ (check as she had unexplained wt loss ~30# over 6 months). She was also noted to have an oral ulcer and thrush.  Her CD4 was found to be 12. She was seen in ID clinic 6-28, she was started on DTGV/Descovy.  She returned to hospital 7-9 to 7-15 with ARDS, IRIS. She was started on steroids, underwent trach Cx negative. Her PCP was (-).   HIV 1 RNA QUANT (copies/mL)  Date Value  04/13/2015 <20  03/30/2015 1200  03/16/2015 [id number]*   CD4 T CELL ABS (/uL)  Date Value  04/13/2015 50*  03/27/2015 150*  03/16/2015 <10*    Has developed L foot drop. Had MRI of her brain on 7-31: 1. Focal 10 mm T2 hyperintensity in the left coronal radiata. This  is nonspecific and could be related to a demyelinating process. Remote ischemic injury could give a similar appearance. This could be related to vasculitis, particularly in this patient with HIV. Previous infection or inflammation is considered less likely. 2. No other acute or focal lesion to explain the patient's symptoms. 3. Mild periventricular T2 changes and T2 changes in the brainstem are slightly advanced for age. Please see the differential diagnosis above.   Has been to neurorehab. Has appt on 8-15 with neurology. Is going to PT biw, walking with walker. No problems with medicaitons- wants to gain some wt.  No further SOB,  cough.     Review of Systems  Constitutional: Negative for appetite change and unexpected weight change.  HENT: Negative for mouth sores.   Respiratory: Negative for cough and shortness of breath.   Cardiovascular: Negative for chest pain.  Genitourinary: Negative for difficulty urinating.  Neurological: Positive for weakness. Negative for dizziness and headaches.       Objective:   Physical Exam  Constitutional: She is oriented to person, place, and time. She appears well-developed and well-nourished.  HENT:  Mouth/Throat: No oropharyngeal exudate.  Eyes: EOM are normal. Pupils are equal, round, and reactive to light.  Neck: Neck supple.  Cardiovascular: Normal rate, regular rhythm and normal heart sounds.   Pulmonary/Chest: Effort normal and breath sounds normal.  Abdominal: Soft. Bowel sounds are normal.  Lymphadenopathy:    She has no cervical adenopathy.  Neurological: She is alert and oriented to person, place, and time.  Mild weakness L foot, plantar flexion.         Assessment & Plan:

## 2015-04-26 NOTE — Assessment & Plan Note (Signed)
Etiology unclear, appreciate neuro f/u.  Not clear this is OI (not toxo, cyrpto). CMV? Will watch as her CD4 improves.

## 2015-04-26 NOTE — Telephone Encounter (Signed)
Forms for patients short term disability faxed to [facility] and leave from work faxed to [employer] at [employer]. Myrtis Hopping

## 2015-04-27 ENCOUNTER — Encounter: Payer: Self-pay | Admitting: Physical Therapy

## 2015-04-27 ENCOUNTER — Ambulatory Visit: Payer: 59 | Admitting: Physical Therapy

## 2015-04-27 DIAGNOSIS — M6281 Muscle weakness (generalized): Secondary | ICD-10-CM

## 2015-04-27 DIAGNOSIS — R269 Unspecified abnormalities of gait and mobility: Secondary | ICD-10-CM | POA: Diagnosis not present

## 2015-04-27 NOTE — Therapy (Signed)
[facility] [address] [place], [place], [address] Phone: [phone]   Fax:  [phone]  Physical Therapy Treatment  Patient Details  Name: Courtney Escobar MRN: [id number] Date of Birth: Oct 02, 1955 Referring Provider:  Campbell Riches, MD  Encounter Date: 04/27/2015      PT End of Session - 04/27/15 0807    Visit Number 4   Number of Visits 16   Date for PT Re-Evaluation 06/17/15   Authorization Type 24 visit limit    PT Start Time 0805   PT Stop Time 0845   PT Time Calculation (min) 40 min   Equipment Utilized During Treatment Gait belt   Activity Tolerance Patient tolerated treatment well   Behavior During Therapy [facility] for tasks assessed/performed      Past Medical History  Diagnosis Date  . Arthritis   . Anxiety   . GERD (gastroesophageal reflux disease)     due to anxiety  . Hyperlipidemia   . Acquired immune deficiency syndrome   . Diabetes mellitus without complication   . HIV (human immunodeficiency virus infection)     Past Surgical History  Procedure Laterality Date  . Tonsillectomy    . Foot surgery    . Cholecystectomy      There were no vitals filed for this visit.  Visit Diagnosis:  Abnormality of gait  Generalized muscle weakness      Subjective Assessment - 04/27/15 0806    Subjective No new complaints. No falls or pain to report. Reports doing HEP at home. Reports feeling tired today.   Currently in Pain? No/denies   Pain Score 0-No pain           [facility] Adult PT Treatment/Exercise - 04/27/15 0820    Ambulation/Gait   Ambulation/Gait Yes   Ambulation/Gait Assistance 5: Supervision   Ambulation Distance (Feet) 1000 Feet   Assistive device None  left anterior walk on ottobock brace   Gait Pattern Step-through pattern;Decreased stride length;Narrow base of support   Ambulation Surface Level;Unlevel;Indoor;Outdoor;Paved   Stairs Yes   Stairs Assistance 5: Supervision    Stair Management Technique Two rails;Alternating pattern;Forwards   Number of Stairs 4  x2 reps   Ramp Other (comment);4: Min assist  min guard assist x 2 reps      Neuro Re-ed: Single leg stance activities With tall cones on blue mat: alternating fwd toe taps, cross toe taps, fwd double toe taps, cross double toe taps and flipping over/up x 10 each bil legs with min guard assist to min assist for balance. Cues on posture and weight shifting/stance stability before lifting other leg.  Exercises Scifit level 2.0 x 4 extremities for 8 minutes with goal rpm >/=  50 for strengthening and activity tolerance.          PT Short Term Goals - 04/16/15 1600    PT SHORT TERM GOAL #1   Title The patient will be independent with HEP for LE strengthening and general mobility.   Baseline Target date 05/17/2015   Time 4   Period Weeks   PT SHORT TERM GOAL #2   Title The patient will trial L AFO and have orthotics consult if needed for L foot drop.   Baseline Target date 05/17/2015   Time 4   Period Weeks   PT SHORT TERM GOAL #3   Title The patient will ambulate household distances x 300 ft without RW modified indep.   Baseline Target date 05/17/2015   Time 4  Period Weeks   PT SHORT TERM GOAL #4   Title The patient will improve gait speed from 2.51 ft/sec up to 2.9 ft/sec for improved community mobility.   Baseline Target date 05/17/2015   Time 4   Period Weeks   PT SHORT TERM GOAL #5   Title The patient will improve R ankle dorsiflexion strength to 4/5 (from 3+/5 baseline).   Baseline Target date 05/17/2015   Time 4   Period Weeks           PT Long Term Goals - 04/17/15 0656    PT LONG TERM GOAL #1   Title The patient will verbalize understanding of post d/c wellness plan in community.   Baseline Target date 06/17/2015   Time 8   Period Weeks   PT LONG TERM GOAL #2   Title The patient will negotiate community surfaces without a device x 1000 ft with L AFO modified indep.    Baseline Target date 06/17/2015   Time 8   Period Weeks   PT LONG TERM GOAL #3   Title The patient will negotiate 4 steps with reciprocal pattern without a handrail using L AFO modified indep.   Baseline Target date 06/17/2015   Time 8   Period Weeks   PT LONG TERM GOAL #4   Title The patient will improve Berg from 49/56 up to 52/56 to demo improving high level balance.   Baseline Target date 06/17/2015   Time 8   Period Weeks   PT LONG TERM GOAL #5   Title The patient will improve functional status score from 46% up to > or equal to 60% for improved subjective perception of mobility.   Baseline Target date 06/17/2015   Time 8   Period Weeks               Plan - 04/27/15 7939    Clinical Impression Statement Pt challenged by balance activities today, needs additional work on this. Gait continues to be improved by brace, awaiting MD orders. Pt making great progress toward goals.   Pt will benefit from skilled therapeutic intervention in order to improve on the following deficits Abnormal gait;Decreased balance;Decreased activity tolerance;Decreased endurance;Impaired sensation;Decreased mobility;Decreased strength;Difficulty walking   Rehab Potential Good   PT Frequency 2x / week   PT Treatment/Interventions Stair training;Functional mobility training;Gait training;Neuromuscular re-education;Manual techniques;Therapeutic activities;Therapeutic exercise;Balance training;Electrical Stimulation;Patient/family education;Orthotic Fit/Training   PT Next Visit Plan endurance activities,  balance activities, strengthening   Consulted and Agree with Plan of Care Patient        Problem List Patient Active Problem List   Diagnosis Date Noted  . Leg edema, left 04/26/2015  . Weakness of left foot 04/26/2015  . ARDS (adult respiratory distress syndrome)   . IRIS (immune reconstitution inflammatory syndrome)   . Acute respiratory failure with hypoxia 03/28/2015  . CAP (community  acquired pneumonia) 03/27/2015  . Leukocytosis 03/27/2015  . HIV (human immunodeficiency virus infection)     Willow Ora 04/27/2015, 7:33 PM  Willow Ora, PTA, [facility] [address], [place], [place] [address] [phone] 04/27/2015, 7:33 PM

## 2015-04-28 ENCOUNTER — Ambulatory Visit: Payer: 59

## 2015-04-28 DIAGNOSIS — F4323 Adjustment disorder with mixed anxiety and depressed mood: Secondary | ICD-10-CM

## 2015-04-28 NOTE — BH Specialist Note (Signed)
I met with Simmone and her husband, [name], for the first time today and they talked about their situation of Roselyne being diagnosed with HIV in June of this year.  [name] is very supportive and said "I am here for you", but wants to understand how she contracted it, but got no answer today.  They talked about their 58 year old daughter, who is married with 4 children, but has reacted very strongly to this and is fearful of her mother and wanting to keep her away from the children at this point, which is very frustrating.  I provided some basic education on what "undetectable" means and Jeremie said this was very helpful to her.  They both want to educate their daughter about this and both hope that this will calm her fears. [name] said he works 3rd shift for a [place] works for [employer].  They both plan to come back in 2 weeks. Curley Spice, LCSW

## 2015-04-30 ENCOUNTER — Ambulatory Visit: Payer: 59 | Admitting: Rehabilitative and Restorative Service Providers"

## 2015-04-30 DIAGNOSIS — R269 Unspecified abnormalities of gait and mobility: Secondary | ICD-10-CM

## 2015-04-30 DIAGNOSIS — M6281 Muscle weakness (generalized): Secondary | ICD-10-CM

## 2015-04-30 NOTE — Therapy (Signed)
[facility] [address] [place], [place], [address] Phone: [phone]   Fax:  [phone]  Physical Therapy Treatment  Patient Details  Name: Courtney Escobar MRN: [id number] Date of Birth: 1956/04/01 Referring Provider:  Campbell Riches, MD  Encounter Date: 04/30/2015      PT End of Session - 04/30/15 0856    Visit Number 5   Number of Visits 16   Date for PT Re-Evaluation 06/17/15   Authorization Type 24 visit limit    PT Start Time 0852   PT Stop Time 0932   PT Time Calculation (min) 40 min   Equipment Utilized During Treatment Gait belt   Activity Tolerance Patient tolerated treatment well   Behavior During Therapy [facility] for tasks assessed/performed      Past Medical History  Diagnosis Date  . Arthritis   . Anxiety   . GERD (gastroesophageal reflux disease)     due to anxiety  . Hyperlipidemia   . Acquired immune deficiency syndrome   . Diabetes mellitus without complication   . HIV (human immunodeficiency virus infection)     Past Surgical History  Procedure Laterality Date  . Tonsillectomy    . Foot surgery    . Cholecystectomy      There were no vitals filed for this visit.  Visit Diagnosis:  Abnormality of gait  Generalized muscle weakness      Subjective Assessment - 04/30/15 0854    Subjective The patient walks into clinic today without the rolling walker.  She is only using the RW for longer distances, unlevel surfaces.     Patient Stated Goals Return to walking without the walker, "get my strength back.".   Currently in Pain? No/denies      THERAPEUTIC EXERCISE: Side stepping x 10 feet x 4 reps in the parallel bars without UE support Standing marching x 10 reps each side Step ups to 4" step R and L x 10 times Step downs from 4" step R and L x 10 times without UE support Heel raises off edge of 4" step in parallel bars x 10 reps R unilateral heel raise x 10, then L with  UE/fingertip support Standing heel cord stretch on rocker device sci fit x 8 minutes x UE/LEs for endurance x 2.0 resistance increasing to 4.0 resistance, then return to 3.0 resistance  NEUROMUSCULAR RE-EDUCATION: Heel toe standing without UE support Single limb stance R and L sides without UE support Rocker board standing with eyes closed, then eyes open and head turns with lateral and ant/posterior directions Rocker board with reactive balanace Heel/toe walking x 10 feet forward/back with intermittent UE support in parallel bars for balance       PT Short Term Goals - 04/16/15 1600    PT SHORT TERM GOAL #1   Title The patient will be independent with HEP for LE strengthening and general mobility.   Baseline Target date 05/17/2015   Time 4   Period Weeks   PT SHORT TERM GOAL #2   Title The patient will trial L AFO and have orthotics consult if needed for L foot drop.   Baseline Target date 05/17/2015   Time 4   Period Weeks   PT SHORT TERM GOAL #3   Title The patient will ambulate household distances x 300 ft without RW modified indep.   Baseline Target date 05/17/2015   Time 4   Period Weeks   PT SHORT TERM GOAL #4   Title The  patient will improve gait speed from 2.51 ft/sec up to 2.9 ft/sec for improved community mobility.   Baseline Target date 05/17/2015   Time 4   Period Weeks   PT SHORT TERM GOAL #5   Title The patient will improve R ankle dorsiflexion strength to 4/5 (from 3+/5 baseline).   Baseline Target date 05/17/2015   Time 4   Period Weeks           PT Long Term Goals - 04/17/15 0656    PT LONG TERM GOAL #1   Title The patient will verbalize understanding of post d/c wellness plan in community.   Baseline Target date 06/17/2015   Time 8   Period Weeks   PT LONG TERM GOAL #2   Title The patient will negotiate community surfaces without a device x 1000 ft with L AFO modified indep.   Baseline Target date 06/17/2015   Time 8   Period Weeks   PT LONG TERM  GOAL #3   Title The patient will negotiate 4 steps with reciprocal pattern without a handrail using L AFO modified indep.   Baseline Target date 06/17/2015   Time 8   Period Weeks   PT LONG TERM GOAL #4   Title The patient will improve Berg from 49/56 up to 52/56 to demo improving high level balance.   Baseline Target date 06/17/2015   Time 8   Period Weeks   PT LONG TERM GOAL #5   Title The patient will improve functional status score from 46% up to > or equal to 60% for improved subjective perception of mobility.   Baseline Target date 06/17/2015   Time 8   Period Weeks               Plan - 04/30/15 1275    Clinical Impression Statement The patient is making progress with endurance and tolerance to activity.  She is also showing progress with ankle dorsiflexion on the L side.  She is scheduled to see neurology next week for iniital assessment.   Pt will benefit from skilled therapeutic intervention in order to improve on the following deficits Abnormal gait;Decreased balance;Decreased activity tolerance;Decreased endurance;Impaired sensation;Decreased mobility;Decreased strength;Difficulty walking   Rehab Potential Good   PT Frequency 2x / week   PT Duration 8 weeks   PT Treatment/Interventions Stair training;Functional mobility training;Gait training;Neuromuscular re-education;Manual techniques;Therapeutic activities;Therapeutic exercise;Balance training;Electrical Stimulation;Patient/family education;Orthotic Fit/Training   PT Next Visit Plan endurance activities,  balance activities, strengthening; check order for L AFO   Consulted and Agree with Plan of Care Patient        Problem List Patient Active Problem List   Diagnosis Date Noted  . Leg edema, left 04/26/2015  . Weakness of left foot 04/26/2015  . ARDS (adult respiratory distress syndrome)   . IRIS (immune reconstitution inflammatory syndrome)   . Acute respiratory failure with hypoxia 03/28/2015  . CAP  (community acquired pneumonia) 03/27/2015  . Leukocytosis 03/27/2015  . HIV (human immunodeficiency virus infection)     [place], PT 04/30/2015, 9:28 AM  [facility] [address] [place], [place], [address] Phone: [phone]   Fax:  [phone]

## 2015-05-03 ENCOUNTER — Ambulatory Visit (INDEPENDENT_AMBULATORY_CARE_PROVIDER_SITE_OTHER): Payer: 59 | Admitting: Neurology

## 2015-05-03 ENCOUNTER — Encounter: Payer: Self-pay | Admitting: Neurology

## 2015-05-03 VITALS — BP 129/90 | HR 84 | Ht 67.0 in | Wt 139.0 lb

## 2015-05-03 DIAGNOSIS — M21372 Foot drop, left foot: Secondary | ICD-10-CM

## 2015-05-03 DIAGNOSIS — B1910 Unspecified viral hepatitis B without hepatic coma: Secondary | ICD-10-CM

## 2015-05-03 DIAGNOSIS — B20 Human immunodeficiency virus [HIV] disease: Secondary | ICD-10-CM

## 2015-05-03 DIAGNOSIS — B169 Acute hepatitis B without delta-agent and without hepatic coma: Secondary | ICD-10-CM

## 2015-05-03 NOTE — Progress Notes (Signed)
PATIENT: Courtney Escobar DOB: 1955/09/20  Chief Complaint  Patient presents with  . Left foot drop    She is here with her husband, [name], to have her left foot drop and numbness evaluated.     HISTORICAL  Courtney Escobar is a 59 years old right-handed female, accompanied by her husband, seen in refer by her primary care physician from [facility] Dr. Wonda Cerise  I reviewed I reviewed and summarized most recent office note she has history of active hepatitis, hepatitis B surface,core antibody were positive, HIV, hyperlipidemia, rheumatoid arthritis history of TB exposure  She was admitted to the hospital for pneumonia in July 2016, stayed in bed for few days, upon discharge, she noticed difficulty picking up left foot off the floor, was a left foot drop, her symptoms has been static since its onset, also has mild left second through fourth toes numbness, she denies significant low back pain,   no right lower extremity involvement, no bowel and bladder incontinence, no bilateral upper extremity paresthesia   laboratory evaluation from April 12 2015, normal CMP, with creatinine 0.53, normal CBC with exception of mild anemia hemoglobin 11 point 6 HIV positive, A1c was 4.9 eighties level was 87, hepatitis B core antibody positive, iron binding capacity was 235, sats rate was 74, TSH was normal 2.13 hepatitis B surface antibody was positive  REVIEW OF SYSTEMS: Full 14 system review of systems performed and notable only for as above  ALLERGIES: Allergies  Allergen Reactions  . Codeine     REACTION: itching    HOME MEDICATIONS: Current Outpatient Prescriptions  Medication Sig Dispense Refill  . azithromycin (ZITHROMAX) 600 MG tablet Take 2 tablets (1,200 mg total) by mouth once a week. 20 tablet 3  . dolutegravir (TIVICAY) 50 MG tablet Take 1 tablet (50 mg total) by mouth daily. 90 tablet 3  . emtricitabine-tenofovir AF (DESCOVY) 200-25 MG per tablet Take 1 tablet by mouth daily. 90  tablet 3  . hydrOXYzine (ATARAX/VISTARIL) 25 MG tablet Take 1 tablet (25 mg total) by mouth 3 (three) times daily as needed for anxiety. 30 tablet 0  . metoprolol (LOPRESSOR) 50 MG tablet Take 1.5 tablets (75 mg total) by mouth 2 (two) times daily. (Patient taking differently: Take 50 mg by mouth 2 (two) times daily. ) 60 tablet 0  . sulfamethoxazole-trimethoprim (BACTRIM) 400-80 MG per tablet Take 1 tablet by mouth 3 (three) times a week. 60 tablet 3   No current facility-administered medications for this visit.    PAST MEDICAL HISTORY: Past Medical History  Diagnosis Date  . Arthritis   . Anxiety   . GERD (gastroesophageal reflux disease)     due to anxiety  . Hyperlipidemia   . Acquired immune deficiency syndrome   . Diabetes mellitus without complication   . HIV (human immunodeficiency virus infection)   . Left foot drop   . Numbness     Left foot/toes    PAST SURGICAL HISTORY: Past Surgical History  Procedure Laterality Date  . Tonsillectomy    . Foot surgery    . Cholecystectomy      FAMILY HISTORY: Family History  Problem Relation Age of Onset  . Hypertension Sister   . Arthritis Brother     ?rheumatoid arthritis, dermal changes  . Cancer Brother   . Tuberculosis Father   . Diabetes Mother   . Diabetes Sister     SOCIAL HISTORY:  Social History   Social History  . Marital Status: Married  Spouse Name: [name]  . Number of Children: 1  . Years of Education: 12   Occupational History  . 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 Smoking status: Former Smoker    Types: Cigarettes    Quit date: 05/22/1996  . Smokeless tobacco: Never Used  . Alcohol Use: No     Comment: none since 10/19/14  . Drug Use: No  . Sexual Activity:    Partners: Male    Birth Control/ Protection: Post-menopausal   Other Topics Concern  . Not on file   Social History Narrative   Lives at home with husband.   Right-handed.   Occasional  caffeine use.     PHYSICAL EXAM   Filed Vitals:   05/03/15 0739  BP: 129/90  Pulse: 84  Height: 5\' 7"  (1.702 m)  Weight: 139 lb (63.05 kg)    Not recorded      Body mass index is 21.77 kg/(m^2).  PHYSICAL EXAMNIATION:  Gen: NAD, conversant, well nourised, obese, well groomed                     Cardiovascular: Regular rate rhythm, no peripheral edema, warm, nontender. Eyes: Conjunctivae clear without exudates or hemorrhage Neck: Supple, no carotid bruise. Pulmonary: Clear to auscultation bilaterally   NEUROLOGICAL EXAM:  MENTAL STATUS: Speech:    Speech is normal; fluent and spontaneous with normal comprehension.  Cognition:     Orientation to time, place and person     Normal recent and remote memory     Normal Attention span and concentration     Normal Language, naming, repeating,spontaneous speech     Fund of knowledge   CRANIAL NERVES: CN II: Visual fields are full to confrontation. Fundoscopic exam is normal with sharp discs and no vascular changes. Pupils are round equal and briskly reactive to light. CN III, IV, VI: extraocular movement are normal. No ptosis. CN V: Facial sensation is intact to pinprick in all 3 divisions bilaterally. Corneal responses are intact.  CN VII: Face is symmetric with normal eye closure and smile. CN VIII: Hearing is normal to rubbing fingers CN IX, X: Palate elevates symmetrically. Phonation is normal. CN XI: Head turning and shoulder shrug are intact CN XII: Tongue is midline with normal movements and no atrophy.  MOTOR: There is no pronator drift of out-stretched arms. Muscle bulk and tone are normal. Muscle strength is normal.  REFLEXES: Reflexes are 2+ and symmetric at the biceps, triceps, knees, and ankles. Plantar responses are flexor.  SENSORY: Intact to light touch, pinprick, position sense, and vibration sense are intact in fingers and toes with exception of mildly decreased light touch and pinprick at the top of  left foot, left first web space,   COORDINATION: Rapid alternating movements and fine finger movements are intact. There is no dysmetria on finger-to-nose and heel-knee-shin.    GAIT/STANCE: She has left foot drop, could not stand on left heels,   DIAGNOSTIC DATA (LABS, IMAGING, TESTING) - I reviewed patient records, labs, notes, testing and imaging myself where available.   ASSESSMENT AND PLAN  Lasha Echeverria is a 59 y.o. female  Left foot drop  Localized to left common peroneal nerve, versus left lumbar radiculopathy  EMG nerve conduction study  Continue physical therapy, left foot AFO  HIV infection  Hepatitis B       Marcial Pacas, M.D. Ph.D.  [facility] [address]  [place], [place] [address] Ph: [phone] Fax: [phone]  CC: Dr. Jenelle Mages

## 2015-05-04 ENCOUNTER — Ambulatory Visit: Payer: 59 | Admitting: Rehabilitative and Restorative Service Providers"

## 2015-05-04 DIAGNOSIS — M6281 Muscle weakness (generalized): Secondary | ICD-10-CM

## 2015-05-04 DIAGNOSIS — R269 Unspecified abnormalities of gait and mobility: Secondary | ICD-10-CM

## 2015-05-04 NOTE — Therapy (Signed)
[facility] [address] [place], [place], [address] Phone: [phone]   Fax:  [phone]  Physical Therapy Treatment  Patient Details  Name: Courtney Escobar MRN: [id number] Date of Birth: 09-May-1956 Referring Provider:  Campbell Riches, MD  Encounter Date: 05/04/2015      PT End of Session - 05/04/15 0900    Visit Number 6   Number of Visits 16   Date for PT Re-Evaluation 06/17/15   Authorization Type 24 visit limit    PT Start Time 0855   PT Stop Time 0933   PT Time Calculation (min) 38 min   Equipment Utilized During Treatment Gait belt   Activity Tolerance Patient tolerated treatment well   Behavior During Therapy [facility] for tasks assessed/performed      Past Medical History  Diagnosis Date  . Arthritis   . Anxiety   . GERD (gastroesophageal reflux disease)     due to anxiety  . Hyperlipidemia   . Acquired immune deficiency syndrome   . Diabetes mellitus without complication   . HIV (human immunodeficiency virus infection)   . Left foot drop   . Numbness     Left foot/toes    Past Surgical History  Procedure Laterality Date  . Tonsillectomy    . Foot surgery    . Cholecystectomy      There were no vitals filed for this visit.  Visit Diagnosis:  Abnormality of gait  Generalized muscle weakness      Subjective Assessment - 05/04/15 0856    Subjective The patient reports she had a good experience with neurology and is going to undergo further testing to determine pathology behind foot drop.     Patient Stated Goals Return to walking without the walker, "get my strength back.".   Currently in Pain? No/denies      THERAPEUTIC EXERCISE: Standing activities at stairs moving foot between 6, 12 and 18" surfaces without UE support for single limb stance control and ankle DF while moving between steps Step downs x 10 reps R and L from 6" step Rocker board with resisted movement into DF/PF with  balance activities encouraging ankle and hip strategy Quadriped hip extension Quadiped UE/LE extension  Quadriped heel cord stretch Plank x 4 reps  Gait: Ambulation x 300 feet without device independently x 2 reps.  With fatigue, the patient has increased foot slap and changes to more of a steppage gait pattern. PT provided cues for heel strike with gait activities.        PT Short Term Goals - 05/04/15 1743    PT SHORT TERM GOAL #1   Title The patient will be independent with HEP for LE strengthening and general mobility.   Baseline Target date 05/17/2015   Time 4   Period Weeks   Status Achieved   PT SHORT TERM GOAL #2   Title The patient will trial L AFO and have orthotics consult if needed for L foot drop.   Baseline Target date 05/17/2015   Time 4   Period Weeks   Status On-going   PT SHORT TERM GOAL #3   Title The patient will ambulate household distances x 300 ft without RW modified indep.   Baseline Met on 05/04/2015 with patient ambulating without RW in home and community for short distances.   Time 4   Period Weeks   Status Achieved   PT SHORT TERM GOAL #4   Title The patient will improve gait speed from 2.51  ft/sec up to 2.9 ft/sec for improved community mobility.   Baseline Target date 05/17/2015   Time 4   Period Weeks   Status On-going   PT SHORT TERM GOAL #5   Title The patient will improve R ankle dorsiflexion strength to 4/5 (from 3+/5 baseline).   Baseline Target date 05/17/2015   Time 4   Period Weeks   Status On-going           PT Long Term Goals - 04/17/15 0656    PT LONG TERM GOAL #1   Title The patient will verbalize understanding of post d/c wellness plan in community.   Baseline Target date 06/17/2015   Time 8   Period Weeks   PT LONG TERM GOAL #2   Title The patient will negotiate community surfaces without a device x 1000 ft with L AFO modified indep.   Baseline Target date 06/17/2015   Time 8   Period Weeks   PT LONG TERM GOAL #3    Title The patient will negotiate 4 steps with reciprocal pattern without a handrail using L AFO modified indep.   Baseline Target date 06/17/2015   Time 8   Period Weeks   PT LONG TERM GOAL #4   Title The patient will improve Berg from 49/56 up to 52/56 to demo improving high level balance.   Baseline Target date 06/17/2015   Time 8   Period Weeks   PT LONG TERM GOAL #5   Title The patient will improve functional status score from 46% up to > or equal to 60% for improved subjective perception of mobility.   Baseline Target date 06/17/2015   Time 8   Period Weeks               Plan - 05/04/15 1745    Clinical Impression Statement The patient met 2 STGs.  She is demonstrating some return in L ankle strength for DF.  Recent neurology note has order for L AFO and therefore, PT to pursue L AFO for community gait.   PT Next Visit Plan endurance activities,  balance activities, strengthening; check order for L AFO   Consulted and Agree with Plan of Care Patient        Problem List Patient Active Problem List   Diagnosis Date Noted  . Leg edema, left 04/26/2015  . Weakness of left foot 04/26/2015  . ARDS (adult respiratory distress syndrome)   . IRIS (immune reconstitution inflammatory syndrome)   . Acute respiratory failure with hypoxia 03/28/2015  . CAP (community acquired pneumonia) 03/27/2015  . Leukocytosis 03/27/2015  . HIV (human immunodeficiency virus infection)     [place], PT 05/04/2015, 5:56 PM  [facility] [address] [place], [place], [address] Phone: [phone]   Fax:  [phone]

## 2015-05-06 ENCOUNTER — Ambulatory Visit: Payer: 59 | Admitting: Rehabilitative and Restorative Service Providers"

## 2015-05-06 DIAGNOSIS — R269 Unspecified abnormalities of gait and mobility: Secondary | ICD-10-CM | POA: Diagnosis not present

## 2015-05-06 DIAGNOSIS — M6281 Muscle weakness (generalized): Secondary | ICD-10-CM

## 2015-05-06 NOTE — Therapy (Signed)
[facility] [address] [place], [place], [address] Phone: [phone]   Fax:  [phone]  Physical Therapy Treatment  Patient Details  Name: Courtney Escobar MRN: [id number] Date of Birth: 11-08-1955 Referring Provider:  Campbell Riches, MD  Encounter Date: 05/06/2015      PT End of Session - 05/06/15 0915    Visit Number 7   Number of Visits 16   Date for PT Re-Evaluation 06/17/15   Authorization Type 24 visit limit    PT Start Time 0850   PT Stop Time 0930   PT Time Calculation (min) 40 min   Equipment Utilized During Treatment --   Activity Tolerance Patient tolerated treatment well   Behavior During Therapy [facility] for tasks assessed/performed      Past Medical History  Diagnosis Date  . Arthritis   . Anxiety   . GERD (gastroesophageal reflux disease)     due to anxiety  . Hyperlipidemia   . Acquired immune deficiency syndrome   . Diabetes mellitus without complication   . HIV (human immunodeficiency virus infection)   . Left foot drop   . Numbness     Left foot/toes    Past Surgical History  Procedure Laterality Date  . Tonsillectomy    . Foot surgery    . Cholecystectomy      There were no vitals filed for this visit.  Visit Diagnosis:  Abnormality of gait  Generalized muscle weakness      Subjective Assessment - 05/06/15 0849    Subjective The patient reports that she is ready for brace and will go by [facility] to obtain brace.    PT to provide her information for location.   Patient Stated Goals Return to walking without the walker, "get my strength back.".   Currently in Pain? No/denies      THERAPEUTIC EXERCISE: Standing rocking with R leg anterior to left lifting toes while in a stride position x 5 reps Quadriped LE and UE contralateral extension x 10 reps L hip extension quadriped with knee flexion x 10 reps R and L sides Elliptical x 2 minutes forward and 2 minutes  backwards Seated sci-fit x 8 minutes with UEs/LEs for endurance trainig Heel raises L single limb stance with fingertip support x 10 reps  NEUROMUSCULAR RE-EDUCATION: Step overs working on L motor timing contracting ankle dorsiflexion for initial contact Standing on left LE with unilateral UE support Wide lunges with hip abduction for L leg stance and bilateral hip stabilization       PT Short Term Goals - 05/04/15 1743    PT SHORT TERM GOAL #1   Title The patient will be independent with HEP for LE strengthening and general mobility.   Baseline Target date 05/17/2015   Time 4   Period Weeks   Status Achieved   PT SHORT TERM GOAL #2   Title The patient will trial L AFO and have orthotics consult if needed for L foot drop.   Baseline Target date 05/17/2015   Time 4   Period Weeks   Status On-going   PT SHORT TERM GOAL #3   Title The patient will ambulate household distances x 300 ft without RW modified indep.   Baseline Met on 05/04/2015 with patient ambulating without RW in home and community for short distances.   Time 4   Period Weeks   Status Achieved   PT SHORT TERM GOAL #4   Title The patient will improve gait speed  from 2.51 ft/sec up to 2.9 ft/sec for improved community mobility.   Baseline Target date 05/17/2015   Time 4   Period Weeks   Status On-going   PT SHORT TERM GOAL #5   Title The patient will improve R ankle dorsiflexion strength to 4/5 (from 3+/5 baseline).   Baseline Target date 05/17/2015   Time 4   Period Weeks   Status On-going           PT Long Term Goals - 04/17/15 0656    PT LONG TERM GOAL #1   Title The patient will verbalize understanding of post d/c wellness plan in community.   Baseline Target date 06/17/2015   Time 8   Period Weeks   PT LONG TERM GOAL #2   Title The patient will negotiate community surfaces without a device x 1000 ft with L AFO modified indep.   Baseline Target date 06/17/2015   Time 8   Period Weeks   PT LONG TERM  GOAL #3   Title The patient will negotiate 4 steps with reciprocal pattern without a handrail using L AFO modified indep.   Baseline Target date 06/17/2015   Time 8   Period Weeks   PT LONG TERM GOAL #4   Title The patient will improve Berg from 49/56 up to 52/56 to demo improving high level balance.   Baseline Target date 06/17/2015   Time 8   Period Weeks   PT LONG TERM GOAL #5   Title The patient will improve functional status score from 46% up to > or equal to 60% for improved subjective perception of mobility.   Baseline Target date 06/17/2015   Time 8   Period Weeks               Plan - 05/06/15 0932    Clinical Impression Statement The patient is continuing to make progress with general mobility, balance and strength.  Continue working towards [employer].     PT Next Visit Plan endurance activities,  balance activities, strengthening L LE primary; ask patient about progress with AFO (gave her card today to call [facility] to schedule appt)   Recommended Other Services Patient provided verbal okay for PT to release information via fax to [facility] for L AFO (MD note, face sheet and PT eval).         Problem List Patient Active Problem List   Diagnosis Date Noted  . Leg edema, left 04/26/2015  . Weakness of left foot 04/26/2015  . ARDS (adult respiratory distress syndrome)   . IRIS (immune reconstitution inflammatory syndrome)   . Acute respiratory failure with hypoxia 03/28/2015  . CAP (community acquired pneumonia) 03/27/2015  . Leukocytosis 03/27/2015  . HIV (human immunodeficiency virus infection)     [place], PT 05/06/2015, 10:10 AM  [facility] [address] [place], [place], [address] Phone: [phone]   Fax:  [phone]

## 2015-05-11 ENCOUNTER — Encounter: Payer: Self-pay | Admitting: Physical Therapy

## 2015-05-11 ENCOUNTER — Ambulatory Visit: Payer: 59 | Admitting: Physical Therapy

## 2015-05-11 DIAGNOSIS — R269 Unspecified abnormalities of gait and mobility: Secondary | ICD-10-CM

## 2015-05-11 DIAGNOSIS — M6281 Muscle weakness (generalized): Secondary | ICD-10-CM

## 2015-05-11 NOTE — Therapy (Signed)
[facility] [address] [place], [place], [address] Phone: [phone]   Fax:  [phone]  Physical Therapy Treatment  Patient Details  Name: Courtney Escobar MRN: [id number] Date of Birth: [birth year] Referring Provider:  Campbell Riches, MD  Encounter Date: 05/11/2015      PT End of Session - 05/11/15 0810    Visit Number 8   Number of Visits 16   Date for PT Re-Evaluation 06/17/15   Authorization Type 24 visit limit    PT Start Time 0807   PT Stop Time 0845   PT Time Calculation (min) 38 min   Activity Tolerance Patient tolerated treatment well   Behavior During Therapy [facility] for tasks assessed/performed      Past Medical History  Diagnosis Date  . Arthritis   . Anxiety   . GERD (gastroesophageal reflux disease)     due to anxiety  . Hyperlipidemia   . Acquired immune deficiency syndrome   . Diabetes mellitus without complication   . HIV (human immunodeficiency virus infection)   . Left foot drop   . Numbness     Left foot/toes    Past Surgical History  Procedure Laterality Date  . Tonsillectomy    . Foot surgery    . Cholecystectomy      There were no vitals filed for this visit.  Visit Diagnosis:  Abnormality of gait  Generalized muscle weakness      Subjective Assessment - 05/11/15 0809    Subjective No new complaints. No falls or pain to report. Has appointment with [employer] on monday 05/17/15 to obtain brace.    Currently in Pain? No/denies     Treatment: Neuro re-ed Balance board in parallel bars With eyes open: both ways on board with no UE support on bars - static hold of board - rocking of board   With eyes closed:both ways on board with no UE support on bars - no head movement - head nods - head shakes  Blue foam balance beam - alternating forward heel taps x 10 each side - alternating backward toe taps x 10 each side   Exercises Quadruped on mat: -Glut kick backs,  alternating legs, x 10 each side -Leg extension with 3 second holds x 10 each side -UE/leg extension, contralateral sides, 3 sec hold x 10 reps each side  Scifit x 4 extremities level 3.5 with rpm >/= 40 x 8 minutes for strengthening and activity tolerance.         PT Short Term Goals - 05/04/15 1743    PT SHORT TERM GOAL #1   Title The patient will be independent with HEP for LE strengthening and general mobility.   Baseline Target date 05/17/2015   Time 4   Period Weeks   Status Achieved   PT SHORT TERM GOAL #2   Title The patient will trial L AFO and have orthotics consult if needed for L foot drop.   Baseline Target date 05/17/2015   Time 4   Period Weeks   Status On-going   PT SHORT TERM GOAL #3   Title The patient will ambulate household distances x 300 ft without RW modified indep.   Baseline Met on 05/04/2015 with patient ambulating without RW in home and community for short distances.   Time 4   Period Weeks   Status Achieved   PT SHORT TERM GOAL #4   Title The patient will improve gait speed from 2.51 ft/sec up to 2.9  ft/sec for improved community mobility.   Baseline Target date 05/17/2015   Time 4   Period Weeks   Status On-going   PT SHORT TERM GOAL #5   Title The patient will improve R ankle dorsiflexion strength to 4/5 (from 3+/5 baseline).   Baseline Target date 05/17/2015   Time 4   Period Weeks   Status On-going           PT Long Term Goals - 04/17/15 0656    PT LONG TERM GOAL #1   Title The patient will verbalize understanding of post d/c wellness plan in community.   Baseline Target date 06/17/2015   Time 8   Period Weeks   PT LONG TERM GOAL #2   Title The patient will negotiate community surfaces without a device x 1000 ft with L AFO modified indep.   Baseline Target date 06/17/2015   Time 8   Period Weeks   PT LONG TERM GOAL #3   Title The patient will negotiate 4 steps with reciprocal pattern without a handrail using L AFO modified indep.    Baseline Target date 06/17/2015   Time 8   Period Weeks   PT LONG TERM GOAL #4   Title The patient will improve Berg from 49/56 up to 52/56 to demo improving high level balance.   Baseline Target date 06/17/2015   Time 8   Period Weeks   PT LONG TERM GOAL #5   Title The patient will improve functional status score from 46% up to > or equal to 60% for improved subjective perception of mobility.   Baseline Target date 06/17/2015   Time 8   Period Weeks           Plan - 05/11/15 6720    Clinical Impression Statement Pt making steady progress toward goals.   Pt will benefit from skilled therapeutic intervention in order to improve on the following deficits Abnormal gait;Decreased balance;Decreased activity tolerance;Decreased endurance;Impaired sensation;Decreased mobility;Decreased strength;Difficulty walking   Rehab Potential Good   PT Frequency 2x / week   PT Duration 8 weeks   PT Treatment/Interventions Stair training;Functional mobility training;Gait training;Neuromuscular re-education;Manual techniques;Therapeutic activities;Therapeutic exercise;Balance training;Electrical Stimulation;Patient/family education;Orthotic Fit/Training   PT Next Visit Plan endurance activities,  balance activities, strengthening L LE primary;        Problem List Patient Active Problem List   Diagnosis Date Noted  . Leg edema, left 04/26/2015  . Weakness of left foot 04/26/2015  . ARDS (adult respiratory distress syndrome)   . IRIS (immune reconstitution inflammatory syndrome)   . Acute respiratory failure with hypoxia 03/28/2015  . CAP (community acquired pneumonia) 03/27/2015  . Leukocytosis 03/27/2015  . HIV (human immunodeficiency virus infection)     Willow Ora 05/11/2015, 8:42 AM  Willow Ora, PTA, [facility] [address], [place], [place] [address] [phone] 05/11/2015, 8:42 AM

## 2015-05-12 ENCOUNTER — Other Ambulatory Visit: Payer: Self-pay | Admitting: Obstetrics and Gynecology

## 2015-05-12 ENCOUNTER — Ambulatory Visit: Payer: 59

## 2015-05-13 ENCOUNTER — Encounter: Payer: Self-pay | Admitting: Physical Therapy

## 2015-05-13 ENCOUNTER — Ambulatory Visit: Payer: 59 | Admitting: Physical Therapy

## 2015-05-13 DIAGNOSIS — M6281 Muscle weakness (generalized): Secondary | ICD-10-CM

## 2015-05-13 DIAGNOSIS — R269 Unspecified abnormalities of gait and mobility: Secondary | ICD-10-CM | POA: Diagnosis not present

## 2015-05-13 NOTE — Therapy (Signed)
[facility] [address] [place], [place], [address] Phone: [phone]   Fax:  [phone]  Physical Therapy Treatment  Patient Details  Name: Courtney Escobar MRN: [id number] Date of Birth: [birth year] Referring Provider:  Campbell Riches, MD  Encounter Date: 05/13/2015   05/13/15 0809  PT Visits / Re-Eval  Visit Number 9  Number of Visits 16  Date for PT Re-Evaluation 06/17/15  Authorization  Authorization Type 24 visit limit   PT Time Calculation  PT Start Time 0803  PT Stop Time 0845  PT Time Calculation (min) 42 min  PT - End of Session  Activity Tolerance Patient tolerated treatment well  Behavior During Therapy [facility] for tasks assessed/performed     Past Medical History  Diagnosis Date  . Arthritis   . Anxiety   . GERD (gastroesophageal reflux disease)     due to anxiety  . Hyperlipidemia   . Acquired immune deficiency syndrome   . Diabetes mellitus without complication   . HIV (human immunodeficiency virus infection)   . Left foot drop   . Numbness     Left foot/toes    Past Surgical History  Procedure Laterality Date  . Tonsillectomy    . Foot surgery    . Cholecystectomy      There were no vitals filed for this visit.  Visit Diagnosis:    Abnormality of gait     Generalized muscle weakness         Subjective Assessment - 05/13/15 0808    Subjective No new complaints. No falls or pain to report. Still going on Monday 05/17/15 to obtain brace.   Currently in Pain? No/denies   Pain Score 0-No pain           [facility] Adult PT Treatment/Exercise - 05/13/15 0810    Ambulation/Gait   Ambulation/Gait Yes   Ambulation/Gait Assistance 6: Modified independent (Device/Increase time)   Ambulation Distance (Feet) 380 Feet  indoors no brace; 1000 feet outdoor paved surfaces w/brace   Assistive device None   Gait Pattern Step-through pattern;Decreased stride length;Narrow base of support    Ambulation Surface Level;Indoor;Outdoor;Paved;Unlevel   Gait velocity 11.53 sec's= 2.85 ft/sec       Exercises Quadruped on mat: -Glut kick backs, alternating legs, x 10 each side -Leg extension with 3 second holds x 10 each side -UE/leg extension, contralateral sides, 3 sec hold x 10 reps each side  Scifit x 4 extremities level 3.5 with rpm >/= 70 x 8 minutes for strengthening and activity tolerance.         PT Short Term Goals - 05/13/15 0809    PT SHORT TERM GOAL #1   Title The patient will be independent with HEP for LE strengthening and general mobility.   Baseline Target date 05/17/2015   Status Achieved   PT SHORT TERM GOAL #2   Title The patient will trial L AFO and have orthotics consult if needed for L foot drop.   Baseline 05/13/15: pt going on Monday 8/29 to get brace   Status Achieved   PT SHORT TERM GOAL #3   Title The patient will ambulate household distances x 300 ft without RW modified indep.   Baseline Met on 05/04/2015 with patient ambulating without RW in home and community for short distances.   Status Achieved   PT SHORT TERM GOAL #4   Title The patient will improve gait speed from 2.51 ft/sec up to 2.9 ft/sec for improved community mobility.  Baseline Target date 05/17/2015   Status On-going   PT SHORT TERM GOAL #5   Title The patient will improve R ankle dorsiflexion strength to 4/5 (from 3+/5 baseline).   Baseline 05/13/15: 4+/5 on testing today   Status Achieved           PT Long Term Goals - 04/17/15 8756    PT LONG TERM GOAL #1   Title The patient will verbalize understanding of post d/c wellness plan in community.   Baseline Target date 06/17/2015   Time 8   Period Weeks   PT LONG TERM GOAL #2   Title The patient will negotiate community surfaces without a device x 1000 ft with L AFO modified indep.   Baseline Target date 06/17/2015   Time 8   Period Weeks   PT LONG TERM GOAL #3   Title The patient will negotiate 4 steps with  reciprocal pattern without a handrail using L AFO modified indep.   Baseline Target date 06/17/2015   Time 8   Period Weeks   PT LONG TERM GOAL #4   Title The patient will improve Berg from 49/56 up to 52/56 to demo improving high level balance.   Baseline Target date 06/17/2015   Time 8   Period Weeks   PT LONG TERM GOAL #5   Title The patient will improve functional status score from 46% up to > or equal to 60% for improved subjective perception of mobility.   Baseline Target date 06/17/2015   Time 8   Period Weeks        05/13/15 0809  Plan  Clinical Impression Statement Pt continues to make steady progress toward goals.   Pt will benefit from skilled therapeutic intervention in order to improve on the following deficits Abnormal gait;Decreased balance;Decreased activity tolerance;Decreased endurance;Impaired sensation;Decreased mobility;Decreased strength;Difficulty walking  Rehab Potential Good  PT Frequency 2x / week  PT Duration 8 weeks  PT Treatment/Interventions Stair training;Functional mobility training;Gait training;Neuromuscular re-education;Manual techniques;Therapeutic activities;Therapeutic exercise;Balance training;Electrical Stimulation;Patient/family education;Orthotic Fit/Training  PT Next Visit Plan endurance activities,  balance activities, strengthening L LE primary;     Problem List Patient Active Problem List   Diagnosis Date Noted  . Leg edema, left 04/26/2015  . Weakness of left foot 04/26/2015  . ARDS (adult respiratory distress syndrome)   . IRIS (immune reconstitution inflammatory syndrome)   . Acute respiratory failure with hypoxia 03/28/2015  . CAP (community acquired pneumonia) 03/27/2015  . Leukocytosis 03/27/2015  . HIV (human immunodeficiency virus infection)     Willow Ora 05/13/2015, 8:43 AM  Willow Ora, PTA, [facility] [address], [place], [place] [address] [phone] 05/13/2015, 8:43 AM

## 2015-05-14 LAB — CYTOLOGY - PAP

## 2015-05-17 ENCOUNTER — Ambulatory Visit: Payer: 59 | Admitting: Rehabilitative and Restorative Service Providers"

## 2015-05-17 DIAGNOSIS — M6281 Muscle weakness (generalized): Secondary | ICD-10-CM

## 2015-05-17 DIAGNOSIS — R269 Unspecified abnormalities of gait and mobility: Secondary | ICD-10-CM

## 2015-05-17 NOTE — Patient Instructions (Signed)
Dorsiflexion: Resisted   Facing anchor, tubing around left foot, pull toward face.  Repeat __10-15__ times per set. Do ____ sets per session. Do ____ sessions per day.  http://orth.exer.us/9   Copyright  VHI. All rights reserved.  SINGLE LIMB STANCE   Stance: single leg on floor. Raise leg. Hold __10-20_ seconds. Repeat with other leg. __3_ reps per set, _1__ sets per day.  Copyright  VHI. All rights reserved.  Feet Together (Compliant Surface) Varied Arm Positions - Eyes Closed   Stand on compliant surface: __pillow______ with feet together and arms out. Close eyes and visualize upright position. Hold__30__ seconds. Repeat _3___ times per session. Do _1-2___ sessions per day.  Copyright  VHI. All rights reserved.

## 2015-05-17 NOTE — Therapy (Signed)
[facility] [address] [place], [place], [address] Phone: [phone]   Fax:  [phone]  Physical Therapy Evaluation  Patient Details  Name: Shuree Brossart MRN: [id number] Date of Birth: [birth year] Referring Provider:  Antony Contras, MD  Encounter Date: 05/17/2015      PT End of Session - 05/17/15 1403    Visit Number 10   Number of Visits 16   Date for PT Re-Evaluation 06/17/15   Authorization Type 24 visit limit    PT Start Time 0850   PT Stop Time 0932   PT Time Calculation (min) 42 min   Activity Tolerance Patient tolerated treatment well   Behavior During Therapy [facility] for tasks assessed/performed      Past Medical History  Diagnosis Date  . Arthritis   . Anxiety   . GERD (gastroesophageal reflux disease)     due to anxiety  . Hyperlipidemia   . Acquired immune deficiency syndrome   . Diabetes mellitus without complication   . HIV (human immunodeficiency virus infection)   . Left foot drop   . Numbness     Left foot/toes    Past Surgical History  Procedure Laterality Date  . Tonsillectomy    . Foot surgery    . Cholecystectomy      There were no vitals filed for this visit.  Visit Diagnosis:  Abnormality of gait  Generalized muscle weakness      Subjective Assessment - 05/17/15 0856    Subjective The patient reports she traveled to [place] over the weekend to see her sister and her family.     Patient Stated Goals Return to walking without the walker, "get my strength back.".   Currently in Pain? No/denies      THERAPEUTIC EXERCISE: Seated ankle dorsiflexion with yellow theraband x 10 reps   NEUROMUSCULAR RE-EDUCATION: Berg=54/56 (see below) Rocker board standing with head turns Rocker board with eyes closed and CGA to [place] Corner on foam with eyes closed and with head turns Single limb stance activities  Gait: 4 steps negotiated with reciprocal pattern and one handrail Gait  without device x 500 + feet with decreased L foot clearance noted, however patient independent with mobility 93M=3.31 ft/sec       [facility] PT Assessment - 05/17/15 0908    Standardized Balance Assessment   Standardized Balance Assessment Berg Balance Test   Berg Balance Test   Sit to Stand Able to stand without using hands and stabilize independently   Standing Unsupported Able to stand safely 2 minutes   Sitting with Back Unsupported but Feet Supported on Floor or Stool Able to sit safely and securely 2 minutes   Stand to Sit Sits safely with minimal use of hands   Transfers Able to transfer safely, minor use of hands   Standing Unsupported with Eyes Closed Able to stand 10 seconds safely   Standing Ubsupported with Feet Together Able to place feet together independently and stand 1 minute safely   From Standing, Reach Forward with Outstretched Arm Can reach confidently >25 cm (10")   From Standing Position, Pick up Object from Floor Able to pick up shoe safely and easily   From Standing Position, Turn to Look Behind Over each Shoulder Looks behind from both sides and weight shifts well   Turn 360 Degrees Able to turn 360 degrees safely in 4 seconds or less   Standing Unsupported, Alternately Place Feet on Step/Stool Able to stand independently and safely and complete  8 steps in 20 seconds   Standing Unsupported, One Foot in [place] to place foot tandem independently and hold 30 seconds   Standing on One Leg Able to lift leg independently and hold equal to or more than 3 seconds   Total Score 54             PT Education - 05/17/15 0921    Education provided Yes   Education Details single leg stance, foam with eyes closed, resisted ankle dorsiflexion bilaterally.   Person(s) Educated Patient   Methods Explanation;Demonstration;Handout   Comprehension Returned demonstration;Verbalized understanding          PT Short Term Goals - 05/17/15 0906    PT SHORT TERM GOAL #1    Title The patient will be independent with HEP for LE strengthening and general mobility.   Baseline Target date 05/17/2015   Status Achieved   PT SHORT TERM GOAL #2   Title The patient will trial L AFO and have orthotics consult if needed for L foot drop.   Baseline 05/13/15: pt going on Monday 8/29 to get brace   Status Achieved   PT SHORT TERM GOAL #3   Title The patient will ambulate household distances x 300 ft without RW modified indep.   Baseline Met on 05/04/2015 with patient ambulating without RW in home and community for short distances.   Status Achieved   PT SHORT TERM GOAL #4   Title The patient will improve gait speed from 2.51 ft/sec up to 2.9 ft/sec for improved community mobility.   Baseline Met on 05/17/2015 with patient improving to 3.31 ft/sec.   Status Achieved   PT SHORT TERM GOAL #5   Title The patient will improve R ankle dorsiflexion strength to 4/5 (from 3+/5 baseline).   Baseline 05/13/15: 4+/5 on testing today   Status Achieved           PT Long Term Goals - 05/17/15 0906    PT LONG TERM GOAL #1   Title The patient will verbalize understanding of post d/c wellness plan in community.   Baseline Target date 06/17/2015   Time 8   Period Weeks   PT LONG TERM GOAL #2   Title The patient will negotiate community surfaces without a device x 1000 ft with L AFO modified indep.   Baseline Met on 05/17/2015 without AFO donned, however patient still going to be fit for AFO for extended surface ambulation.   Time 8   Period Weeks   Status Achieved   PT LONG TERM GOAL #3   Title The patient will negotiate 4 steps with reciprocal pattern without a handrail using L AFO modified indep.   Baseline Met on 05/17/2015.   Time 8   Period Weeks   Status Achieved   PT LONG TERM GOAL #4   Title The patient will improve Berg from 49/56 up to 52/56 to demo improving high level balance.   Baseline Met on 05/17/2015 with patient scoring 54/56.   Time 8   Period Weeks   Status  Achieved   PT LONG TERM GOAL #5   Title The patient will improve functional status score from 46% up to > or equal to 60% for improved subjective perception of mobility.   Baseline Target date 06/17/2015   Time 8   Period Weeks               Plan - 05/17/15 1404    Clinical Impression Statement The patient met all  STGs and 3/5 LTGs.  PT to continue progressing towards remaining LTGs.  Anticipate the patient will be able to decrease frequency to 1x/week due to progress since evaluation and current functional status.  The patient is being measured for AFO, which I anticipate will be used for community ambulation only at this time.   PT Next Visit Plan will consider decrease to 1x/week as patient able to progress HEP, check updated HEP, L LE strengthening, general mobility, begin transition to community activities.   Consulted and Agree with Plan of Care Patient         Problem List Patient Active Problem List   Diagnosis Date Noted  . Leg edema, left 04/26/2015  . Weakness of left foot 04/26/2015  . ARDS (adult respiratory distress syndrome)   . IRIS (immune reconstitution inflammatory syndrome)   . Acute respiratory failure with hypoxia 03/28/2015  . CAP (community acquired pneumonia) 03/27/2015  . Leukocytosis 03/27/2015  . HIV (human immunodeficiency virus infection)     [place], PT 05/17/2015, 2:09 PM  [facility] [address] [place], [place], [address] Phone: [phone]   Fax:  [phone]

## 2015-05-25 ENCOUNTER — Telehealth: Payer: Self-pay | Admitting: Infectious Diseases

## 2015-05-25 NOTE — Telephone Encounter (Signed)
Patient called stating that she was written out of work through 9/19 but her appointment with Dr. Johnnye Sima is on the 06/09/15. She is also having evaluation at rehabilitation on 9/29, and a nerve test on 9/12 at neurologist. Patient is requesting to extend leave through 10/3 so she can have a chance to attend all appointments and get evaluated to see if she can go back to work. By doing this she will not get penalized for absences.

## 2015-05-25 NOTE — Telephone Encounter (Signed)
agreed

## 2015-05-26 ENCOUNTER — Encounter: Payer: Self-pay | Admitting: *Deleted

## 2015-05-26 NOTE — Telephone Encounter (Signed)
Letter written and faxed to patient's HR, attention Lori Josts.  P:[phone], J:[phone] Landis Gandy, RN

## 2015-05-28 ENCOUNTER — Ambulatory Visit: Payer: 59 | Attending: Infectious Diseases | Admitting: Rehabilitative and Restorative Service Providers"

## 2015-05-28 DIAGNOSIS — M6281 Muscle weakness (generalized): Secondary | ICD-10-CM | POA: Diagnosis present

## 2015-05-28 DIAGNOSIS — R269 Unspecified abnormalities of gait and mobility: Secondary | ICD-10-CM | POA: Insufficient documentation

## 2015-05-28 NOTE — Therapy (Signed)
[facility] [address] [place], [place], [address] Phone: [phone]   Fax:  [phone]  Physical Therapy Treatment  Patient Details  Name: Courtney Escobar MRN: [id number] Date of Birth: 08/04/1956 Referring Provider:  Antony Contras, MD  Encounter Date: 05/28/2015      PT End of Session - 05/28/15 0856    Visit Number 11   Number of Visits 16   Date for PT Re-Evaluation 06/17/15   Authorization Type 24 visit limit    PT Start Time 0850   PT Stop Time 0930   PT Time Calculation (min) 40 min   Activity Tolerance Patient tolerated treatment well   Behavior During Therapy [facility] for tasks assessed/performed      Past Medical History  Diagnosis Date  . Arthritis   . Anxiety   . GERD (gastroesophageal reflux disease)     due to anxiety  . Hyperlipidemia   . Acquired immune deficiency syndrome   . Diabetes mellitus without complication   . HIV (human immunodeficiency virus infection)   . Left foot drop   . Numbness     Left foot/toes    Past Surgical History  Procedure Laterality Date  . Tonsillectomy    . Foot surgery    . Cholecystectomy      There were no vitals filed for this visit.  Visit Diagnosis:  Abnormality of gait  Generalized muscle weakness      Subjective Assessment - 05/28/15 0855    Subjective The patient reports she ended up ordering a foot up brace after trialing them with orthotist.   Patient Stated Goals Return to walking without the walker, "get my strength back.".   Currently in Pain? No/denies      THERAPEUTIC EXERCISE: Seated ankle dorsiflexion adding eversion Attempted to add red theraband resistance, however the patient compensates with ER Standing elliptical x 3 minutes forward, 2 minutes backwards  Gait: Ambulation x 350 feet with mild foot drop still noted          [facility] Adult PT Treatment/Exercise - 05/28/15 0858    Self-Care   Self-Care Other Self-Care  Comments   Other Self-Care Comments  Discussed work tasks and return to prior level of movement.  She reports she is awaiting brace to arrive from [facility].  PT and patient discussed general activity level and any limitations for work tasks.  At this point, she feels safe to return.  She is scheduled to return to work end of September.           PT Education - 05/28/15 1006    Education provided Yes   Education Details HEP: ankle eversion seated with knee stable to isolate movement   Person(s) Educated Patient   Methods Explanation;Demonstration;Handout   Comprehension Verbalized understanding;Returned demonstration          PT Short Term Goals - 05/17/15 0906    PT SHORT TERM GOAL #1   Title The patient will be independent with HEP for LE strengthening and general mobility.   Baseline Target date 05/17/2015   Status Achieved   PT SHORT TERM GOAL #2   Title The patient will trial L AFO and have orthotics consult if needed for L foot drop.   Baseline 05/13/15: pt going on Monday 8/29 to get brace   Status Achieved   PT SHORT TERM GOAL #3   Title The patient will ambulate household distances x 300 ft without RW modified indep.   Baseline Met on 05/04/2015  with patient ambulating without RW in home and community for short distances.   Status Achieved   PT SHORT TERM GOAL #4   Title The patient will improve gait speed from 2.51 ft/sec up to 2.9 ft/sec for improved community mobility.   Baseline Met on 05/17/2015 with patient improving to 3.31 ft/sec.   Status Achieved   PT SHORT TERM GOAL #5   Title The patient will improve R ankle dorsiflexion strength to 4/5 (from 3+/5 baseline).   Baseline 05/13/15: 4+/5 on testing today   Status Achieved           PT Long Term Goals - 05/17/15 0906    PT LONG TERM GOAL #1   Title The patient will verbalize understanding of post d/c wellness plan in community.   Baseline Target date 06/17/2015   Time 8   Period Weeks   PT LONG TERM GOAL #2    Title The patient will negotiate community surfaces without a device x 1000 ft with L AFO modified indep.   Baseline Met on 05/17/2015 without AFO donned, however patient still going to be fit for AFO for extended surface ambulation.   Time 8   Period Weeks   Status Achieved   PT LONG TERM GOAL #3   Title The patient will negotiate 4 steps with reciprocal pattern without a handrail using L AFO modified indep.   Baseline Met on 05/17/2015.   Time 8   Period Weeks   Status Achieved   PT LONG TERM GOAL #4   Title The patient will improve Berg from 49/56 up to 52/56 to demo improving high level balance.   Baseline Met on 05/17/2015 with patient scoring 54/56.   Time 8   Period Weeks   Status Achieved   PT LONG TERM GOAL #5   Title The patient will improve functional status score from 46% up to > or equal to 60% for improved subjective perception of mobility.   Baseline Target date 06/17/2015   Time 8   Period Weeks               Plan - 05/28/15 1006    Clinical Impression Statement The patient is progressing LTGs and general mobility.  PT recommended she hold our next visit in order to allow time to receive foot up brace.  I would like to see her one further visit in order to ensure brace is adequate for safe return to work. Patient with NCV and EMG scheduled for next week.   PT Next Visit Plan Observe gait wiht foot up brace, progress HEP if needed, d/c.   Consulted and Agree with Plan of Care Patient        Problem List Patient Active Problem List   Diagnosis Date Noted  . Leg edema, left 04/26/2015  . Weakness of left foot 04/26/2015  . ARDS (adult respiratory distress syndrome)   . IRIS (immune reconstitution inflammatory syndrome)   . Acute respiratory failure with hypoxia 03/28/2015  . CAP (community acquired pneumonia) 03/27/2015  . Leukocytosis 03/27/2015  . HIV (human immunodeficiency virus infection)     [place], PT 05/28/2015, 10:08 AM  [facility] [address] [place], [place], [address] Phone: [phone]   Fax:  [phone]

## 2015-05-28 NOTE — Patient Instructions (Signed)
ROM: Inversion / Eversion   SITTING WITH FIST BETWEEN KNEES,  Lift your left foot up and gently turn ankle out. Repeat __10__ times per set. Do __1__ sets per session. Do ___2_ sessions per day.  http://orth.exer.us/37   Copyright  VHI. All rights reserved.

## 2015-05-31 ENCOUNTER — Ambulatory Visit (INDEPENDENT_AMBULATORY_CARE_PROVIDER_SITE_OTHER): Payer: Self-pay | Admitting: Neurology

## 2015-05-31 ENCOUNTER — Ambulatory Visit (INDEPENDENT_AMBULATORY_CARE_PROVIDER_SITE_OTHER): Payer: 59 | Admitting: Neurology

## 2015-05-31 ENCOUNTER — Ambulatory Visit: Payer: 59 | Admitting: Physical Therapy

## 2015-05-31 DIAGNOSIS — M21372 Foot drop, left foot: Secondary | ICD-10-CM | POA: Diagnosis not present

## 2015-05-31 DIAGNOSIS — B1910 Unspecified viral hepatitis B without hepatic coma: Secondary | ICD-10-CM

## 2015-05-31 DIAGNOSIS — B199 Unspecified viral hepatitis without hepatic coma: Secondary | ICD-10-CM | POA: Insufficient documentation

## 2015-05-31 DIAGNOSIS — Z0289 Encounter for other administrative examinations: Secondary | ICD-10-CM

## 2015-05-31 DIAGNOSIS — B20 Human immunodeficiency virus [HIV] disease: Secondary | ICD-10-CM | POA: Insufficient documentation

## 2015-05-31 DIAGNOSIS — M5416 Radiculopathy, lumbar region: Secondary | ICD-10-CM

## 2015-05-31 NOTE — Progress Notes (Signed)
EMG nerve conduction study today showed evidence of active left L5 radiculopathy MRI of lumbar is ordered, she will return to clinic in 2 weeks

## 2015-05-31 NOTE — Procedures (Signed)
   NCS (NERVE CONDUCTION STUDY) WITH EMG (ELECTROMYOGRAPHY) REPORT   STUDY DATE: May 31 2015 PATIENT NAME: Courtney Escobar DOB: 1955-12-30 MRN: [id number]    TECHNOLOGIST: Laretta Alstrom ELECTROMYOGRAPHER: Marcial Pacas M.D.  CLINICAL INFORMATION:  59 years old female, with past medical history of HIV, hepatitis B, presented with left foot drop since her hospital admission in July 2016, overall has much improved, she denies significant low back pain, no right lower extremity symptoms, no bowel and bladder incontinence.  On exam: She has mild left ankle dorsiflexion, ankle eversion weakness, mild sensory loss at the top of left foot, deep tendon reflexes including ankle reflexes are well preserved.  FINDINGS: NERVE CONDUCTION STUDY: Bilateral peroneal sensory responses were present and symmetric. Bilateral tibial motor responses were normal and symmetric, bilateral tibial H reflexes were present and symmetric.  Right peroneal to EDB motor responses were normal. Left peroneal to EDB motor responses showed moderately decreased C map amplitude, with normal distal latency, conduction velocity.     NEEDLE ELECTROMYOGRAPHY: Selective needle examination was performed at left lower extremity muscles and left lumbosacral paraspinal muscles. Left tibialis anterior, left peroneal longus: Increased insertional activity, 3 plus spontaneous activity, enlarge complex motor unit potential, with decreased recruitment patterns. Left tibialis posterior, medial gastrocnemius: Increased insertional activity, no spontaneous activity, enlarge complex motor unit potential, with decreased recruitment patterns. Left vastus lateralis, left biceps femoris short head: normal insertion activity, no spontaneous activity, enlarged simple morphology motor unit potential, with mildly decreased recruitment patterns.  Left gluteus medius, normal insertion activity, no spontaneous activity, normal morphology motor unit  potential, mildly decreased recruitment patterns.  There was no spontaneous activity at left lumbar sacral paraspinal muscles, left L4-5 S1.     IMPRESSION: This is an abnormal study. There is electrodiagnostic evidence of active left L5 radiculopathy. The well-preserved left peroneal sensory response argue against the left sciatic neuropathy, left common peroneal neuropathy.    INTERPRETING PHYSICIAN:   Marcial Pacas M.D. Ph.D. [facility] [address] [place], [place] [address] [phone]

## 2015-06-01 ENCOUNTER — Ambulatory Visit: Payer: 59 | Admitting: Rehabilitative and Restorative Service Providers"

## 2015-06-08 ENCOUNTER — Other Ambulatory Visit: Payer: Self-pay | Admitting: *Deleted

## 2015-06-08 ENCOUNTER — Ambulatory Visit: Payer: 59 | Admitting: Rehabilitative and Restorative Service Providers"

## 2015-06-08 ENCOUNTER — Telehealth: Payer: Self-pay | Admitting: Neurology

## 2015-06-08 ENCOUNTER — Encounter: Payer: Self-pay | Admitting: Rehabilitative and Restorative Service Providers"

## 2015-06-08 DIAGNOSIS — M6281 Muscle weakness (generalized): Secondary | ICD-10-CM

## 2015-06-08 DIAGNOSIS — R269 Unspecified abnormalities of gait and mobility: Secondary | ICD-10-CM

## 2015-06-08 DIAGNOSIS — M5416 Radiculopathy, lumbar region: Secondary | ICD-10-CM

## 2015-06-08 NOTE — Telephone Encounter (Signed)
MRI placed per Dr. Rhea Belton orders from NCV/EMG.

## 2015-06-08 NOTE — Therapy (Signed)
[facility] [address] [place], [place], [address] Phone: [phone]   Fax:  [phone]  Patient Details  Name: Courtney Escobar MRN: [id number] Date of Birth: 03/26/1956 Referring Provider:  No ref. provider found  Encounter Date: 06/08/2015  PHYSICAL THERAPY DISCHARGE SUMMARY  Visits from Start of Care: 12  Current functional level related to goals / functional outcomes:     PT Short Term Goals - 05/17/15 0906    PT SHORT TERM GOAL #1   Title The patient will be independent with HEP for LE strengthening and general mobility.   Baseline Target date 05/17/2015   Status Achieved   PT SHORT TERM GOAL #2   Title The patient will trial L AFO and have orthotics consult if needed for L foot drop.   Baseline 05/13/15: pt going on Monday 8/29 to get brace   Status Achieved   PT SHORT TERM GOAL #3   Title The patient will ambulate household distances x 300 ft without RW modified indep.   Baseline Met on 05/04/2015 with patient ambulating without RW in home and community for short distances.   Status Achieved   PT SHORT TERM GOAL #4   Title The patient will improve gait speed from 2.51 ft/sec up to 2.9 ft/sec for improved community mobility.   Baseline Met on 05/17/2015 with patient improving to 3.31 ft/sec.   Status Achieved   PT SHORT TERM GOAL #5   Title The patient will improve R ankle dorsiflexion strength to 4/5 (from 3+/5 baseline).   Baseline 05/13/15: 4+/5 on testing today   Status Achieved         PT Long Term Goals - 06/08/15 0856    PT LONG TERM GOAL #1   Title The patient will verbalize understanding of post d/c wellness plan in community.   Baseline on 9/20, patient verbalizes access to gym at work and has HEP to continue post d/c.   Time 8   Period Weeks   Status Achieved   PT LONG TERM GOAL #2   Title The patient will negotiate community surfaces without a device x 1000 ft with L AFO modified indep.   Baseline Met on 05/17/2015 without AFO donned, however patient still going to be fit for AFO for extended surface ambulation.   Time 8   Period Weeks   Status Achieved   PT LONG TERM GOAL #3   Title The patient will negotiate 4 steps with reciprocal pattern without a handrail using L AFO modified indep.   Baseline Met on 05/17/2015.   Time 8   Period Weeks   Status Achieved   PT LONG TERM GOAL #4   Title The patient will improve Berg from 49/56 up to 52/56 to demo improving high level balance.   Baseline Met on 05/17/2015 with patient scoring 54/56.   Time 8   Period Weeks   Status Achieved   PT LONG TERM GOAL #5   Title The patient will improve functional status score from 46% up to > or equal to 60% for improved subjective perception of mobility.   Baseline Pt improved from 46% up to 93% on functional status survey.   Time 8   Period Weeks   Status Achieved        Remaining deficits: L ankle weakness   Education / Equipment: HEP, community wellness, use of foot up brace for safe/long distance community ambulation.  Plan: Patient agrees to discharge.  Patient goals were met. Patient  is being discharged due to meeting the stated rehab goals.  ?????     Thank you for the referral of this patient. Rudell Cobb, MPT           WEAVER,CHRISTINA 06/08/2015, 9:21 AM  [facility] [address] [place], [place], [address] Phone: [phone]   Fax:  [phone]

## 2015-06-08 NOTE — Therapy (Signed)
[facility] [address] [place], [place], [address] Phone: [phone]   Fax:  [phone]  Physical Therapy Treatment  Patient Details  Name: Courtney Escobar MRN: [id number] Date of Birth: 05-Nov-1955 Referring Provider:  Antony Contras, MD  Encounter Date: 06/08/2015      PT End of Session - 06/08/15 0910    Visit Number 12   Number of Visits 16   Date for PT Re-Evaluation 06/17/15   Authorization Type 24 visit limit    PT Start Time 0854   PT Stop Time 0908   PT Time Calculation (min) 14 min   Equipment Utilized During Treatment Gait belt   Activity Tolerance Patient tolerated treatment well   Behavior During Therapy [facility] for tasks assessed/performed      Past Medical History  Diagnosis Date  . Arthritis   . Anxiety   . GERD (gastroesophageal reflux disease)     due to anxiety  . Hyperlipidemia   . Acquired immune deficiency syndrome   . Diabetes mellitus without complication   . HIV (human immunodeficiency virus infection)   . Left foot drop   . Numbness     Left foot/toes    Past Surgical History  Procedure Laterality Date  . Tonsillectomy    . Foot surgery    . Cholecystectomy      There were no vitals filed for this visit.  Visit Diagnosis:  Abnormality of gait  Generalized muscle weakness      Subjective Assessment - 06/08/15 0908    Subjective The patient reports no back pain, however NCV and EMG studies indicate possible lumbar radiculopathy leading to foot drop.  The patient got foot up brace from orthotist last week and is independent in community mobility.   Currently in Pain? No/denies      Gait: Patient ambulates 800 feet without a device with L foot up brace independently.  She reports being able to negotiate all surfaces with AFO.  She is not limited in community mobility at this time.  SELF CARE/HOME MANAGEMENT: Discussed post d/c HEP, access to gym at work and continuing  returning to prior activities.         PT Short Term Goals - 05/17/15 2694    PT SHORT TERM GOAL #1   Title The patient will be independent with HEP for LE strengthening and general mobility.   Baseline Target date 05/17/2015   Status Achieved   PT SHORT TERM GOAL #2   Title The patient will trial L AFO and have orthotics consult if needed for L foot drop.   Baseline 05/13/15: pt going on Monday 8/29 to get brace   Status Achieved   PT SHORT TERM GOAL #3   Title The patient will ambulate household distances x 300 ft without RW modified indep.   Baseline Met on 05/04/2015 with patient ambulating without RW in home and community for short distances.   Status Achieved   PT SHORT TERM GOAL #4   Title The patient will improve gait speed from 2.51 ft/sec up to 2.9 ft/sec for improved community mobility.   Baseline Met on 05/17/2015 with patient improving to 3.31 ft/sec.   Status Achieved   PT SHORT TERM GOAL #5   Title The patient will improve R ankle dorsiflexion strength to 4/5 (from 3+/5 baseline).   Baseline 05/13/15: 4+/5 on testing today   Status Achieved           PT Long Term Goals - 06/08/15  0856    PT LONG TERM GOAL #1   Title The patient will verbalize understanding of post d/c wellness plan in community.   Baseline on 9/20, patient verbalizes access to gym at work and has HEP to continue post d/c.   Time 8   Period Weeks   Status Achieved   PT LONG TERM GOAL #2   Title The patient will negotiate community surfaces without a device x 1000 ft with L AFO modified indep.   Baseline Met on 05/17/2015 without AFO donned, however patient still going to be fit for AFO for extended surface ambulation.   Time 8   Period Weeks   Status Achieved   PT LONG TERM GOAL #3   Title The patient will negotiate 4 steps with reciprocal pattern without a handrail using L AFO modified indep.   Baseline Met on 05/17/2015.   Time 8   Period Weeks   Status Achieved   PT LONG TERM GOAL #4    Title The patient will improve Berg from 49/56 up to 52/56 to demo improving high level balance.   Baseline Met on 05/17/2015 with patient scoring 54/56.   Time 8   Period Weeks   Status Achieved   PT LONG TERM GOAL #5   Title The patient will improve functional status score from 46% up to > or equal to 60% for improved subjective perception of mobility.   Baseline Pt improved from 46% up to 93% on functional status survey.   Time 8   Period Weeks   Status Achieved               Problem List Patient Active Problem List   Diagnosis Date Noted  . Foot drop, left 05/31/2015  . HIV disease 05/31/2015  . Hepatitis, viral 05/31/2015  . Leg edema, left 04/26/2015  . Weakness of left foot 04/26/2015  . ARDS (adult respiratory distress syndrome)   . IRIS (immune reconstitution inflammatory syndrome)   . Acute respiratory failure with hypoxia 03/28/2015  . CAP (community acquired pneumonia) 03/27/2015  . Leukocytosis 03/27/2015  . HIV (human immunodeficiency virus infection)     Brashear, PT 06/08/2015, 9:19 AM  [facility] [address] [place], [place], [address] Phone: [phone]   Fax:  [phone]

## 2015-06-08 NOTE — Telephone Encounter (Signed)
Patient stopped by at the office wanted to follow up and see why she has not been contacted to schedule her MRI that she thought was going to be ordered on 05/31/15. I advised the patient that there was no order placed in there yet. I also advised her that I would call her once Dr. Krista Blue places the MRI order in. Patient can be reached @ [phone]

## 2015-06-09 ENCOUNTER — Encounter: Payer: Self-pay | Admitting: *Deleted

## 2015-06-09 ENCOUNTER — Encounter: Payer: Self-pay | Admitting: Infectious Diseases

## 2015-06-09 ENCOUNTER — Ambulatory Visit (INDEPENDENT_AMBULATORY_CARE_PROVIDER_SITE_OTHER): Payer: 59 | Admitting: Infectious Diseases

## 2015-06-09 VITALS — BP 114/77 | HR 70 | Temp 97.8°F | Ht 67.0 in | Wt 147.0 lb

## 2015-06-09 DIAGNOSIS — A419 Sepsis, unspecified organism: Secondary | ICD-10-CM

## 2015-06-09 DIAGNOSIS — M2142 Flat foot [pes planus] (acquired), left foot: Secondary | ICD-10-CM

## 2015-06-09 DIAGNOSIS — Z21 Asymptomatic human immunodeficiency virus [HIV] infection status: Secondary | ICD-10-CM | POA: Diagnosis not present

## 2015-06-09 DIAGNOSIS — R29898 Other symptoms and signs involving the musculoskeletal system: Secondary | ICD-10-CM

## 2015-06-09 DIAGNOSIS — Z79899 Other long term (current) drug therapy: Secondary | ICD-10-CM

## 2015-06-09 DIAGNOSIS — D893 Immune reconstitution syndrome: Secondary | ICD-10-CM

## 2015-06-09 DIAGNOSIS — B20 Human immunodeficiency virus [HIV] disease: Secondary | ICD-10-CM

## 2015-06-09 DIAGNOSIS — Z113 Encounter for screening for infections with a predominantly sexual mode of transmission: Secondary | ICD-10-CM | POA: Diagnosis not present

## 2015-06-09 NOTE — Progress Notes (Signed)
   Subjective:    Patient ID: Courtney Escobar, female    DOB: Apr 18, 1956, 59 y.o.   MRN: [id number]  HPI  59 yo F who was seen previously in ID for LTBI in 2011 (HIV - then) as she was being screened for embrel (off since 05-2014). Her father had TB. She was treated with INH at that time. She also has hx of RA, elevated lipids, chronic hepatitis (elevated alk phos, MR abd 02-27-15 normal). Has had 2 negative liver Bx per pt.  She was seen by her PCP 6-21 and had Glc of 200 and was noted to have new HIV+ (check as she had unexplained wt loss ~30# over 6 months). She was also noted to have an oral ulcer and thrush.  Her CD4 was found to be 12. She was seen in ID clinic 6-28, she was started on DTGV/Descovy.  She returned to hospital 7-9 to 7-15 with ARDS, IRIS.  Her course was then complicated by L foot drop. She had MRI of brain showing small L corona radiata lesion.  She has had f/u with neuro, had recent NCV.  She is scheduled to have MRI spine. Feels like her foot strength is better. Has been released from rehab.   Today she states that she feels great.  Has occas episode of loose bowels, abd distress/cramping. Occas assoc with taking ART on emptying stomach.    HIV 1 RNA QUANT (copies/mL)  Date Value  04/13/2015 <20  03/30/2015 1200  03/16/2015 030131*   CD4 T CELL ABS (/uL)  Date Value  04/13/2015 50*  03/27/2015 150*  03/16/2015 <10*   No problems with her medicatons.   Has had PAP this year. Mammo as well.   Review of Systems  Constitutional: Negative for appetite change and unexpected weight change.  Respiratory: Negative for cough and shortness of breath.   Gastrointestinal: Positive for abdominal pain and diarrhea. Negative for constipation.  Genitourinary: Negative for difficulty urinating.       Objective:   Physical Exam  Constitutional: She appears well-developed and well-nourished.  HENT:  Mouth/Throat: No oropharyngeal exudate.  Eyes: EOM are normal.  Pupils are equal, round, and reactive to light.  Neck: Neck supple.  Cardiovascular: Normal rate, regular rhythm and normal heart sounds.   Pulmonary/Chest: Effort normal and breath sounds normal.  Abdominal: Soft. Bowel sounds are normal. There is no tenderness. There is no rebound.  Musculoskeletal: She exhibits no tenderness.  Lymphadenopathy:    She has no cervical adenopathy.       Assessment & Plan:

## 2015-06-09 NOTE — Assessment & Plan Note (Signed)
Husband is (-).  Husband needs to be tested every 6 months.  Flu shot today Mammo/pap up to date.  Will check CD4 and RNA today.  GI upset around her azithro.  Hopefully can stop OI prophylaxis soon.  rtc in 6 months.

## 2015-06-09 NOTE — Assessment & Plan Note (Signed)
Await MRI of her spine.  Appreciate neuro f/u.

## 2015-06-09 NOTE — Assessment & Plan Note (Signed)
Off steroids, appears to be resolved.

## 2015-06-10 LAB — HIV-1 RNA QUANT-NO REFLEX-BLD

## 2015-06-10 LAB — T-HELPER CELL (CD4) - (RCID CLINIC ONLY)
CD4 % Helper T Cell: 3 % — ABNORMAL LOW (ref 33–55)
CD4 T Cell Abs: 50 /uL — ABNORMAL LOW (ref 400–2700)

## 2015-06-15 ENCOUNTER — Ambulatory Visit: Payer: 59 | Admitting: Rehabilitative and Restorative Service Providers"

## 2015-06-16 ENCOUNTER — Ambulatory Visit (INDEPENDENT_AMBULATORY_CARE_PROVIDER_SITE_OTHER): Payer: 59 | Admitting: *Deleted

## 2015-06-16 DIAGNOSIS — Z23 Encounter for immunization: Secondary | ICD-10-CM | POA: Diagnosis not present

## 2015-06-23 ENCOUNTER — Ambulatory Visit (INDEPENDENT_AMBULATORY_CARE_PROVIDER_SITE_OTHER): Payer: 59

## 2015-06-23 DIAGNOSIS — M5416 Radiculopathy, lumbar region: Secondary | ICD-10-CM | POA: Diagnosis not present

## 2015-06-28 ENCOUNTER — Telehealth: Payer: Self-pay | Admitting: Neurology

## 2015-06-28 NOTE — Telephone Encounter (Signed)
Patient is calling back about the results of her MRI and connected with Sharyn Lull.

## 2015-06-28 NOTE — Telephone Encounter (Signed)
Left message for a return call

## 2015-06-28 NOTE — Telephone Encounter (Signed)
Attempted call again - left message for a return call.

## 2015-06-28 NOTE — Telephone Encounter (Signed)
Offered various appts in October - she chose November 14th because it worked best with her schedule - told her to call me if she would like to move it up to an earlier date.

## 2015-06-28 NOTE — Telephone Encounter (Signed)
Please call patient, MRI of the lumbar spine showed evidence of degenerative disc disease, most severe at L3-4, L4-5, with potential nerve roots compression,  She should have all follow-up appointment To go over the findings  IMPRESSION: This is an abnormal MRI of the lumbar spine without contrast showing the following: 1. At L3-L4 there is a left disc protrusion superimposed on a broad disc bulge causing moderate left lateral recess stenosis. There is no definite nerve root compression though there is some encroachment upon the traversing left L4 nerve root. 2. At L4-L5 there is a left disc protrusion causing moderate left lateral recess stenosis. There is no definite nerve root compression though there was some encroachment upon the traversing L5 nerve root.

## 2015-08-02 ENCOUNTER — Encounter: Payer: Self-pay | Admitting: Neurology

## 2015-08-02 ENCOUNTER — Ambulatory Visit (INDEPENDENT_AMBULATORY_CARE_PROVIDER_SITE_OTHER): Payer: 59 | Admitting: Neurology

## 2015-08-02 VITALS — BP 118/78 | HR 90 | Ht 67.0 in | Wt 159.0 lb

## 2015-08-02 DIAGNOSIS — R930 Abnormal findings on diagnostic imaging of skull and head, not elsewhere classified: Secondary | ICD-10-CM

## 2015-08-02 DIAGNOSIS — M21372 Foot drop, left foot: Secondary | ICD-10-CM

## 2015-08-02 DIAGNOSIS — R29898 Other symptoms and signs involving the musculoskeletal system: Secondary | ICD-10-CM

## 2015-08-02 DIAGNOSIS — R9089 Other abnormal findings on diagnostic imaging of central nervous system: Secondary | ICD-10-CM | POA: Insufficient documentation

## 2015-08-02 DIAGNOSIS — M2142 Flat foot [pes planus] (acquired), left foot: Secondary | ICD-10-CM

## 2015-08-02 NOTE — Progress Notes (Signed)
Chief Complaint  Patient presents with  . Foot Drop    She is here with her husband, [name], to discuss her MRI and EMG/NCV results.      PATIENT: Courtney Escobar DOB: 03-18-1956  Chief Complaint  Patient presents with  . Foot Drop    She is here with her husband, [name], to discuss her MRI and EMG/NCV results.     HISTORICAL  Courtney Escobar is a 59 years old right-handed female, accompanied by her husband, seen in refer by her primary care physician from [facility] practice Dr. Wonda Cerise in May 03 2015  I reviewed I reviewed and summarized most recent office note she has history of active hepatitis, hepatitis B surface,core antibody were positive, HIV, hyperlipidemia, rheumatoid arthritis history of TB exposure  She was admitted to the hospital for pneumonia in July 2016, stayed in bed for few days, upon discharge, she noticed difficulty picking up left foot off the floor, was a left foot drop, her symptoms has been static since its onset, also has mild left second through fourth toes numbness, she denies significant low back pain,   no right lower extremity involvement, no bowel and bladder incontinence, no bilateral upper extremity paresthesia   laboratory evaluation from April 12 2015, normal CMP, with creatinine 0.53, normal CBC with exception of mild anemia hemoglobin 11 point 6 HIV positive, A1c was 4.9 eighties level was 87, hepatitis B core antibody positive, iron binding capacity was 235, sats rate was 74, TSH was normal 2.13 hepatitis B surface antibody was positive  UPDATE Aug 02 2015: She is now back to work, her left foot weakness has much improved, she can stand 10 hours without much difficulty.  She has no low back pain, no gait difficulty, no bowel and bladder incontinence.    I reviewed MRI brain:  Focal 10 mm T2 hyperintensity in the left coronal radiata. This is nonspecific and could be related to a demyelinating process. Remote ischemic injury could give a similar  appearance. This could be related to vasculitis, particularly in this patient with HIV. Previous infection or inflammation is considered less likely.  No other acute or focal lesion to explain the patient's symptoms. Mild periventricular T2 changes and T2 changes in the brainstem are slightly advanced for age.  MRI lumbar: L3-L4 there is a left disc protrusion superimposed on a broad disc bulge causing moderate left lateral recess stenosis. There is no definite nerve root compression though there is some encroachment upon the traversing left L4 nerve root. L4-L5 there is a left disc protrusion causing moderate left lateral recess stenosis. There is no definite nerve root compression though there was some encroachment upon the traversing L5 nerve root.   EMG/NCS in Sep 2016 showed evidence of active left L5 radiculopathy  REVIEW OF SYSTEMS: Full 14 system review of systems performed and notable only for as above  ALLERGIES: Allergies  Allergen Reactions  . Codeine     REACTION: itching    HOME MEDICATIONS: Current Outpatient Prescriptions  Medication Sig Dispense Refill  . azithromycin (ZITHROMAX) 600 MG tablet Take 2 tablets (1,200 mg total) by mouth once a week. 20 tablet 3  . dolutegravir (TIVICAY) 50 MG tablet Take 1 tablet (50 mg total) by mouth daily. 90 tablet 3  . emtricitabine-tenofovir AF (DESCOVY) 200-25 MG per tablet Take 1 tablet by mouth daily. 90 tablet 3  . hydrOXYzine (ATARAX/VISTARIL) 25 MG tablet Take 1 tablet (25 mg total) by mouth 3 (three) times daily as needed  for anxiety. 30 tablet 0  . metoprolol (LOPRESSOR) 50 MG tablet Take 1.5 tablets (75 mg total) by mouth 2 (two) times daily. (Patient taking differently: Take 50 mg by mouth 2 (two) times daily. ) 60 tablet 0  . sulfamethoxazole-trimethoprim (BACTRIM) 400-80 MG per tablet Take 1 tablet by mouth 3 (three) times a week. 60 tablet 3   No current facility-administered medications for this visit.    PAST MEDICAL  HISTORY: Past Medical History  Diagnosis Date  . Arthritis   . Anxiety   . GERD (gastroesophageal reflux disease)     due to anxiety  . Hyperlipidemia   . Acquired immune deficiency syndrome ([place])   . Diabetes mellitus without complication ([place])   . HIV (human immunodeficiency virus infection) ([place])   . Left foot drop   . Numbness     Left foot/toes    PAST SURGICAL HISTORY: Past Surgical History  Procedure Laterality Date  . Tonsillectomy    . Foot surgery    . Cholecystectomy      FAMILY HISTORY: Family History  Problem Relation Age of Onset  . Hypertension Sister   . Arthritis Brother     ?rheumatoid arthritis, dermal changes  . Cancer Brother   . Tuberculosis Father   . Diabetes Mother   . Diabetes Sister     SOCIAL HISTORY:  Social History   Social History  . Marital Status: Married    Spouse Name: [name]  . Number of Children: 1  . Years of Education: 12   Occupational History  . 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 Smoking status: Former Smoker    Types: Cigarettes    Quit date: 05/22/1996  . Smokeless tobacco: Never Used  . Alcohol Use: No     Comment: none since 10/19/14  . Drug Use: No  . Sexual Activity:    Partners: Male    Birth Control/ Protection: Post-menopausal   Other Topics Concern  . Not on file   Social History Narrative   Lives at home with husband.   Right-handed.   Occasional caffeine use.     PHYSICAL EXAM   Filed Vitals:   08/02/15 1634  BP: 118/78  Pulse: 90  Height: 5\' 7"  (1.702 m)  Weight: 159 lb (72.122 kg)    Not recorded      Body mass index is 24.9 kg/(m^2).  PHYSICAL EXAMNIATION:  Gen: NAD, conversant, well nourised, obese, well groomed                     Cardiovascular: Regular rate rhythm, no peripheral edema, warm, nontender. Eyes: Conjunctivae clear without exudates or hemorrhage Neck: Supple, no carotid bruise. Pulmonary: Clear to auscultation  bilaterally   NEUROLOGICAL EXAM:  MENTAL STATUS: Speech:    Speech is normal; fluent and spontaneous with normal comprehension.  Cognition:     Orientation to time, place and person     Normal recent and remote memory     Normal Attention span and concentration     Normal Language, naming, repeating,spontaneous speech     Fund of knowledge   CRANIAL NERVES: CN II: Visual fields are full to confrontation. Fundoscopic exam is normal with sharp discs and no vascular changes. Pupils are round equal and briskly reactive to light. CN III, IV, VI: extraocular movement are normal. No ptosis. CN V: Facial sensation is intact to pinprick in all 3 divisions bilaterally. Corneal responses  are intact.  CN VII: Face is symmetric with normal eye closure and smile. CN VIII: Hearing is normal to rubbing fingers CN IX, X: Palate elevates symmetrically. Phonation is normal. CN XI: Head turning and shoulder shrug are intact CN XII: Tongue is midline with normal movements and no atrophy.  MOTOR: There is no pronator drift of out-stretched arms. Muscle bulk and tone are normal. Muscle strength is normal,  with exception of slight left toe extension weakness REFLEXES: Reflexes are 2+ and symmetric at the biceps, triceps, knees, and ankles. Plantar responses are flexor.  SENSORY: Intact to light touch, pinprick, position sense, and vibration sense are intact in fingers and toes with exception of mildly decreased light touch and pinprick at the top of left foot, left first web space,   COORDINATION: Rapid alternating movements and fine finger movements are intact. There is no dysmetria on finger-to-nose and heel-knee-shin.    GAIT/STANCE: Walking is normal, she is able to work on tiptoe and heels,   DIAGNOSTIC DATA (LABS, IMAGING, TESTING) - I reviewed patient records, labs, notes, testing and imaging myself where available.   ASSESSMENT AND PLAN  Courtney Escobar is a 59 y.o. female  Left foot  drop  Left lumbar radiculopathy, MRI showed evidence of foraminal stenosis at left L3-4, L4-5, left foot drop has much improved,  Continue moderate exercise Abnormal MRI of the brain  Single isolates dictation at left coronal radiata,  Demyelinating versus small ischemic event  Repeat MRI of the brain with and without contrast in July fourth 2017, follow-up visit afterwards  HIV infection  Hepatitis B       Marcial Pacas, M.D. Ph.D.  [facility] [address] [place], [place] [address] Ph: [phone] Fax: [phone]  CC: Dr. Jenelle Mages

## 2015-08-16 ENCOUNTER — Encounter: Payer: Self-pay | Admitting: Infectious Diseases

## 2015-08-17 ENCOUNTER — Encounter: Payer: Self-pay | Admitting: Infectious Diseases

## 2015-09-21 LAB — AFB CULTURE, BLOOD

## 2015-12-03 ENCOUNTER — Other Ambulatory Visit: Payer: 59

## 2015-12-03 DIAGNOSIS — Z113 Encounter for screening for infections with a predominantly sexual mode of transmission: Secondary | ICD-10-CM

## 2015-12-03 DIAGNOSIS — Z79899 Other long term (current) drug therapy: Secondary | ICD-10-CM

## 2015-12-03 DIAGNOSIS — B20 Human immunodeficiency virus [HIV] disease: Secondary | ICD-10-CM

## 2015-12-03 LAB — COMPREHENSIVE METABOLIC PANEL
ALBUMIN: 4.3 g/dL (ref 3.6–5.1)
ALT: 14 U/L (ref 6–29)
AST: 20 U/L (ref 10–35)
Alkaline Phosphatase: 144 U/L — ABNORMAL HIGH (ref 33–130)
BILIRUBIN TOTAL: 0.8 mg/dL (ref 0.2–1.2)
BUN: 13 mg/dL (ref 7–25)
CO2: 29 mmol/L (ref 20–31)
CREATININE: 0.75 mg/dL (ref 0.50–1.05)
Calcium: 9.5 mg/dL (ref 8.6–10.4)
Chloride: 102 mmol/L (ref 98–110)
Glucose, Bld: 91 mg/dL (ref 65–99)
Potassium: 4.7 mmol/L (ref 3.5–5.3)
SODIUM: 139 mmol/L (ref 135–146)
TOTAL PROTEIN: 7 g/dL (ref 6.1–8.1)

## 2015-12-03 LAB — LIPID PANEL
CHOLESTEROL: 279 mg/dL — AB (ref 125–200)
HDL: 140 mg/dL (ref >=46)
LDL CALC: 127 mg/dL (ref <130)
TRIGLYCERIDES: 61 mg/dL (ref <150)
Total CHOL/HDL Ratio: 2 Ratio (ref <=5.0)
VLDL: 12 mg/dL (ref <30)

## 2015-12-03 LAB — T-HELPER CELL (CD4) - (RCID CLINIC ONLY)
CD4 T CELL HELPER: 8 % — AB (ref 33–55)
CD4 T Cell Abs: 150 /uL — ABNORMAL LOW (ref 400–2700)

## 2015-12-03 LAB — CBC
HEMATOCRIT: 40.6 % (ref 36.0–46.0)
Hemoglobin: 13.5 g/dL (ref 12.0–15.0)
MCH: 31 pg (ref 26.0–34.0)
MCHC: 33.3 g/dL (ref 30.0–36.0)
MCV: 93.1 fL (ref 78.0–100.0)
MPV: 10.1 fL (ref 8.6–12.4)
PLATELETS: 182 10*3/uL (ref 150–400)
RBC: 4.36 MIL/uL (ref 3.87–5.11)
RDW: 13.4 % (ref 11.5–15.5)
WBC: 4.4 10*3/uL (ref 4.0–10.5)

## 2015-12-04 LAB — RPR

## 2015-12-07 LAB — HIV-1 RNA QUANT-NO REFLEX-BLD
HIV 1 RNA QUANT: 34 {copies}/mL — AB (ref <20)
HIV-1 RNA Quant, Log: 1.53 Log copies/mL — ABNORMAL HIGH (ref <1.30)

## 2015-12-21 ENCOUNTER — Encounter: Payer: Self-pay | Admitting: Infectious Diseases

## 2015-12-21 ENCOUNTER — Ambulatory Visit (INDEPENDENT_AMBULATORY_CARE_PROVIDER_SITE_OTHER): Payer: 59 | Admitting: Infectious Diseases

## 2015-12-21 VITALS — BP 161/99 | HR 90 | Temp 97.8°F | Wt 188.0 lb

## 2015-12-21 DIAGNOSIS — R29898 Other symptoms and signs involving the musculoskeletal system: Secondary | ICD-10-CM

## 2015-12-21 DIAGNOSIS — R930 Abnormal findings on diagnostic imaging of skull and head, not elsewhere classified: Secondary | ICD-10-CM

## 2015-12-21 DIAGNOSIS — B20 Human immunodeficiency virus [HIV] disease: Secondary | ICD-10-CM | POA: Diagnosis not present

## 2015-12-21 DIAGNOSIS — M2142 Flat foot [pes planus] (acquired), left foot: Secondary | ICD-10-CM

## 2015-12-21 DIAGNOSIS — R9089 Other abnormal findings on diagnostic imaging of central nervous system: Secondary | ICD-10-CM

## 2015-12-21 NOTE — Assessment & Plan Note (Signed)
She is doing very well i encouraged her to exercise and lose wt.  Offered/refused condoms.  Will see her back in 6 months with labs prior.  Appreciate f/u of her OB/GYN practice.  Will stop azithromycin.

## 2015-12-21 NOTE — Assessment & Plan Note (Signed)
She will f/u with neuro as needed.  I am not sure that her corona radiata lesion has resolved or what it's significance was.

## 2015-12-21 NOTE — Assessment & Plan Note (Signed)
Will defer f/u of this to neuro.

## 2015-12-21 NOTE — Progress Notes (Signed)
   Subjective:    Patient ID: Courtney Escobar, female    DOB: [birth year], 60 y.o.   MRN: [id number]  HPI 60 yo F who was seen previously in ID for LTBI in 2011 (HIV - then) as she was being screened for embrel (off since 05-2014). Her father had TB. She was treated with INH at that time. She also has hx of RA, elevated lipids, chronic hepatitis (elevated alk phos, MR abd 02-27-15 normal). Has had 2 negative liver Bx per pt.  She was seen by her PCP 6-21 and had Glc of 200 and was noted to have new HIV+ (check as she had unexplained wt loss ~30# over 6 months). She was also noted to have an oral ulcer and thrush.  Her CD4 was found to be 12. She was seen in ID clinic 6-28, she was started on DTGV/Descovy.  She returned to hospital 7-9 to 7-15 with ARDS, IRIS.  Her course was then complicated by L foot drop. She had MRI of brain showing small L corona radiata lesion.  She had MRI 06-2015 showing: IMPRESSION:  This is an abnormal MRI of the lumbar spine without contrast showing the following: 1.  At L3-L4 there is a left disc protrusion superimposed on a broad disc bulge causing moderate left lateral recess stenosis. There is no definite nerve root compression though there is some encroachment upon the traversing left L4 nerve root. 2.   At L4-L5 there is a left disc protrusion causing moderate left lateral recess stenosis. There is no definite nerve root compression though there was some encroachment upon the traversing L5 nerve root Has had f/u with neuro- NCV was negative. Her foot drop has resolved. Has gained 53# since August 2016.  She is currently taking azithro, bactrim, DTGV/Descovy.  She continues to have arthitis pain- feels like she has trigger finger. She has a new rheum dr.   HIV 1 RNA QUANT (copies/mL)  Date Value  12/03/2015 34*  06/09/2015 <20  04/13/2015 <20   CD4 T CELL ABS (/uL)  Date Value  12/03/2015 150*  06/09/2015 50*  04/13/2015 50*   Had PAP 10-2015. Had  mammogram 2017 as well. Both at [place].   Review of Systems  Constitutional: Positive for unexpected weight change. Negative for appetite change.  Respiratory: Negative for shortness of breath.   Cardiovascular: Negative for chest pain.  Gastrointestinal: Negative for diarrhea and constipation.  Genitourinary: Negative for difficulty urinating and menstrual problem.  Neurological: Negative for dizziness and headaches.       Objective:   Physical Exam  Constitutional: She appears well-developed and well-nourished.  HENT:  Mouth/Throat: No oropharyngeal exudate.  Eyes: EOM are normal. Pupils are equal, round, and reactive to light.  Neck: Neck supple.  Cardiovascular: Normal rate, regular rhythm and normal heart sounds.   Pulmonary/Chest: Effort normal and breath sounds normal.  Abdominal: Soft. Bowel sounds are normal. There is no tenderness. There is no rebound.  Musculoskeletal: She exhibits no edema.  Lymphadenopathy:    She has no cervical adenopathy.      Assessment & Plan:

## 2016-03-06 ENCOUNTER — Other Ambulatory Visit: Payer: Self-pay | Admitting: Infectious Diseases

## 2016-03-06 DIAGNOSIS — B20 Human immunodeficiency virus [HIV] disease: Secondary | ICD-10-CM

## 2016-03-07 ENCOUNTER — Telehealth: Payer: Self-pay | Admitting: *Deleted

## 2016-03-07 NOTE — Telephone Encounter (Signed)
Patient needs to make an appointment for a PAP smear.

## 2016-04-09 ENCOUNTER — Other Ambulatory Visit: Payer: Self-pay | Admitting: Infectious Diseases

## 2016-04-09 DIAGNOSIS — B20 Human immunodeficiency virus [HIV] disease: Secondary | ICD-10-CM

## 2016-05-01 ENCOUNTER — Ambulatory Visit: Payer: Self-pay | Admitting: Neurology

## 2016-05-17 ENCOUNTER — Other Ambulatory Visit: Payer: Self-pay | Admitting: Infectious Diseases

## 2016-05-17 DIAGNOSIS — B20 Human immunodeficiency virus [HIV] disease: Secondary | ICD-10-CM

## 2016-06-10 ENCOUNTER — Other Ambulatory Visit: Payer: Self-pay | Admitting: Infectious Diseases

## 2016-06-10 DIAGNOSIS — B20 Human immunodeficiency virus [HIV] disease: Secondary | ICD-10-CM

## 2016-06-30 ENCOUNTER — Other Ambulatory Visit (HOSPITAL_COMMUNITY)
Admission: RE | Admit: 2016-06-30 | Discharge: 2016-06-30 | Disposition: A | Discharge status: Home / Self Care | Payer: 59 | Source: Ambulatory Visit | Attending: Infectious Diseases | Admitting: Infectious Diseases

## 2016-06-30 ENCOUNTER — Other Ambulatory Visit: Payer: 59

## 2016-06-30 DIAGNOSIS — B20 Human immunodeficiency virus [HIV] disease: Secondary | ICD-10-CM

## 2016-06-30 DIAGNOSIS — Z113 Encounter for screening for infections with a predominantly sexual mode of transmission: Secondary | ICD-10-CM | POA: Diagnosis not present

## 2016-06-30 DIAGNOSIS — Z79899 Other long term (current) drug therapy: Secondary | ICD-10-CM

## 2016-06-30 LAB — CBC
HCT: 38.4 % (ref 35.0–45.0)
Hemoglobin: 13.2 g/dL (ref 11.7–15.5)
MCH: 31.2 pg (ref 27.0–33.0)
MCHC: 34.4 g/dL (ref 32.0–36.0)
MCV: 90.8 fL (ref 80.0–100.0)
MPV: 10 fL (ref 7.5–12.5)
Platelets: 207 10*3/uL (ref 140–400)
RBC: 4.23 MIL/uL (ref 3.80–5.10)
RDW: 14.1 % (ref 11.0–15.0)
WBC: 6.6 10*3/uL (ref 3.8–10.8)

## 2016-06-30 LAB — T-HELPER CELL (CD4) - (RCID CLINIC ONLY)
CD4 T CELL ABS: 280 /uL — AB (ref 400–2700)
CD4 T CELL HELPER: 11 % — AB (ref 33–55)

## 2016-07-01 LAB — COMPREHENSIVE METABOLIC PANEL
ALBUMIN: 4 g/dL (ref 3.6–5.1)
ALT: 19 U/L (ref 6–29)
AST: 18 U/L (ref 10–35)
Alkaline Phosphatase: 130 U/L (ref 33–130)
BILIRUBIN TOTAL: 0.9 mg/dL (ref 0.2–1.2)
BUN: 12 mg/dL (ref 7–25)
CHLORIDE: 102 mmol/L (ref 98–110)
CO2: 25 mmol/L (ref 20–31)
CREATININE: 0.71 mg/dL (ref 0.50–1.05)
Calcium: 9.5 mg/dL (ref 8.6–10.4)
GLUCOSE: 92 mg/dL (ref 65–99)
Potassium: 4.7 mmol/L (ref 3.5–5.3)
SODIUM: 140 mmol/L (ref 135–146)
Total Protein: 7 g/dL (ref 6.1–8.1)

## 2016-07-01 LAB — LIPID PANEL
CHOLESTEROL: 268 mg/dL — AB (ref 125–200)
Triglycerides: 48 mg/dL (ref <150)
VLDL: 10 mg/dL (ref <30)

## 2016-07-03 LAB — URINE CYTOLOGY ANCILLARY ONLY
Chlamydia: NEGATIVE
Neisseria Gonorrhea: NEGATIVE

## 2016-07-04 LAB — HIV-1 RNA QUANT-NO REFLEX-BLD

## 2016-07-12 ENCOUNTER — Encounter: Payer: Self-pay | Admitting: Infectious Diseases

## 2016-07-12 ENCOUNTER — Ambulatory Visit (INDEPENDENT_AMBULATORY_CARE_PROVIDER_SITE_OTHER): Payer: 59 | Admitting: Infectious Diseases

## 2016-07-12 VITALS — BP 130/86 | HR 70 | Temp 97.3°F | Wt 204.0 lb

## 2016-07-12 DIAGNOSIS — M069 Rheumatoid arthritis, unspecified: Secondary | ICD-10-CM | POA: Insufficient documentation

## 2016-07-12 DIAGNOSIS — B20 Human immunodeficiency virus [HIV] disease: Secondary | ICD-10-CM | POA: Diagnosis not present

## 2016-07-12 DIAGNOSIS — Z79899 Other long term (current) drug therapy: Secondary | ICD-10-CM | POA: Diagnosis not present

## 2016-07-12 DIAGNOSIS — Z113 Encounter for screening for infections with a predominantly sexual mode of transmission: Secondary | ICD-10-CM

## 2016-07-12 DIAGNOSIS — L409 Psoriasis, unspecified: Secondary | ICD-10-CM | POA: Diagnosis not present

## 2016-07-12 MED ORDER — TOFACITINIB CITRATE ER 11 MG PO TB24: 1.0000 | ORAL_TABLET | Freq: Every day | ORAL | 3 refills | Status: DC

## 2016-07-12 NOTE — Assessment & Plan Note (Addendum)
She is doing very well Will stop bactrim  will continue her current rx Has condoms Has gotten flu shot.  Will get pap/mammo with PCP next month.  Husband is (-). Husband gets tested every 6 months.  Will see her back in 6 months.

## 2016-07-12 NOTE — Progress Notes (Signed)
   Subjective:    Patient ID: Courtney Escobar, female    DOB: [birth year], 60 y.o.   MRN: [id number]  HPI 60 yo F who was seen previously in ID for LTBI in 2011 (HIV - then) as she was being screened for embrel (off since 05-2014).  She was treated with INH at that time.  She also has hx of RA, elevated lipids, chronic hepatitis (elevated alk phos, MR abd 02-27-15 normal). Has had 2 negative liver Bx per pt.  She was seen by her PCP 03-09-15 and had Glc of 200 and was noted to have new HIV+ (check as she had unexplained wt loss ~30# over 6 months). She was also noted to have an oral ulcer and thrush.  Her CD4 was found to be 12. She was seen in ID clinic 6-28, she was started on DTGV/Descovy.  She returned to hospital 7-9 to 7-15 with ARDS, IRIS.   Has been busy visiting grand-kids.  Has been doing well with ART.  Still on bactrim, has stopped azithro.  Going next month to get mammo pap.  Not exercising. (has gym at work). Not watching diet.  Has gained 70# since July.   HIV 1 RNA Quant (copies/mL)  Date Value  06/30/2016 <20  12/03/2015 34 (H)  06/09/2015 <20   CD4 T Cell Abs (/uL)  Date Value  06/30/2016 280 (L)  12/03/2015 150 (L)  06/09/2015 50 (L)    Review of Systems  Constitutional: Negative for appetite change and unexpected weight change.  Respiratory: Negative for cough and shortness of breath.   Gastrointestinal: Negative for constipation and diarrhea.  Genitourinary: Negative for difficulty urinating.   Has gotten flu shot at work.     Objective:   Physical Exam  Constitutional: She appears well-developed and well-nourished.  HENT:  Mouth/Throat: No oropharyngeal exudate.  Eyes: EOM are normal. Pupils are equal, round, and reactive to light.  Neck: Neck supple.  Cardiovascular: Normal rate, regular rhythm and normal heart sounds.   Pulmonary/Chest: Effort normal and breath sounds normal.  Abdominal: Soft. Bowel sounds are normal. There is no tenderness.    Musculoskeletal: She exhibits no edema.          Assessment & Plan:

## 2016-07-12 NOTE — Assessment & Plan Note (Signed)
Off embrel Now Morrie Sheldon

## 2016-08-02 ENCOUNTER — Other Ambulatory Visit: Payer: Self-pay | Admitting: Obstetrics and Gynecology

## 2016-08-03 LAB — CYTOLOGY - PAP

## 2016-08-31 IMAGING — US US ABDOMEN COMPLETE
1 series · 14 of 25 positions shown · non-contrast
Comparison: 11/25/2009

CLINICAL DATA: Elevated liver function tests. Prior liver biopsy.
Cholecystectomy.

EXAM:
ULTRASOUND ABDOMEN COMPLETE

[Series 1: us abdomen complete · 0.30mm/px · 14 of 63 slices shown]
[im 1/63]
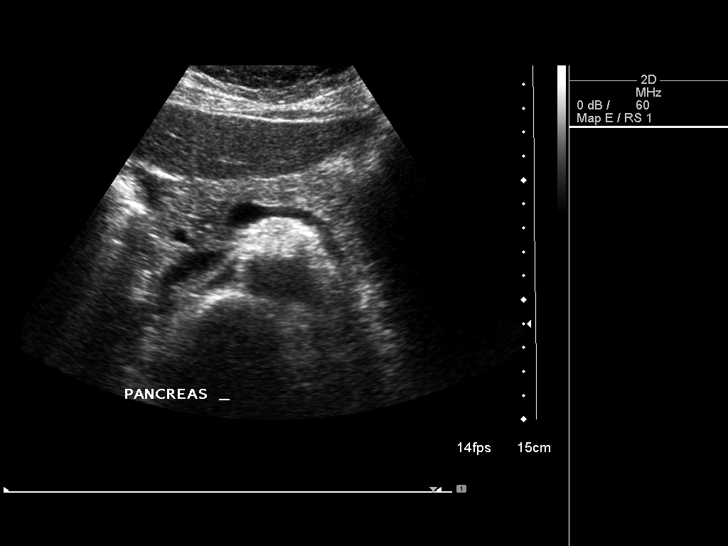
[im 6/63]
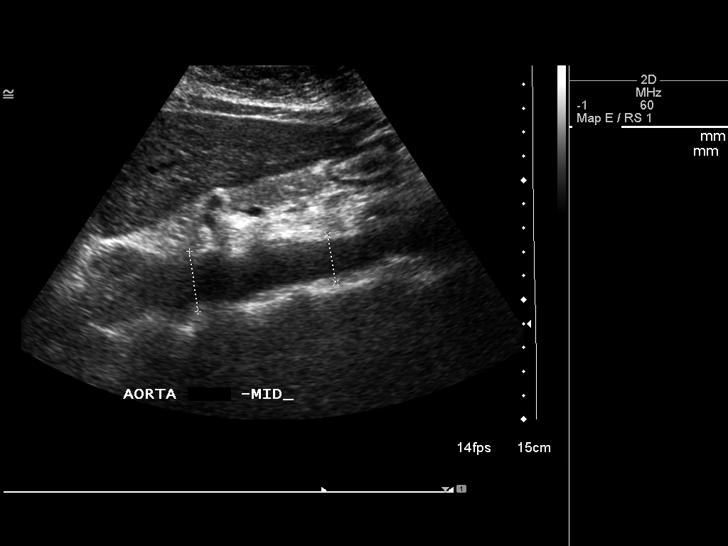
[im 11/63]
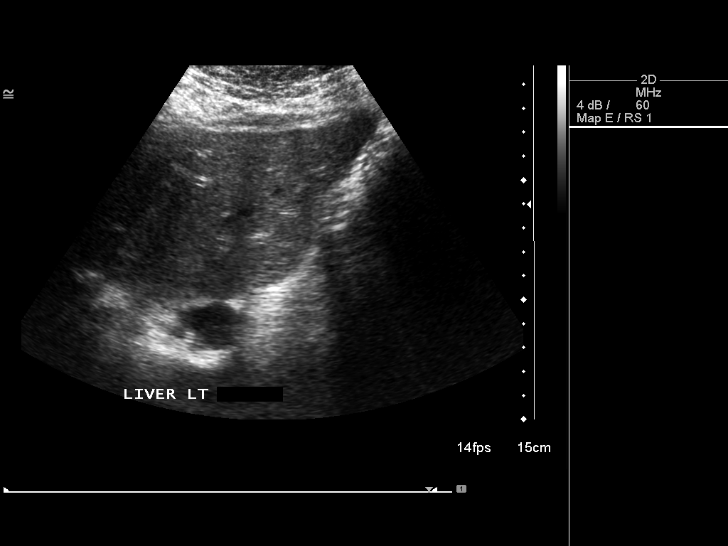
[im 16/63]
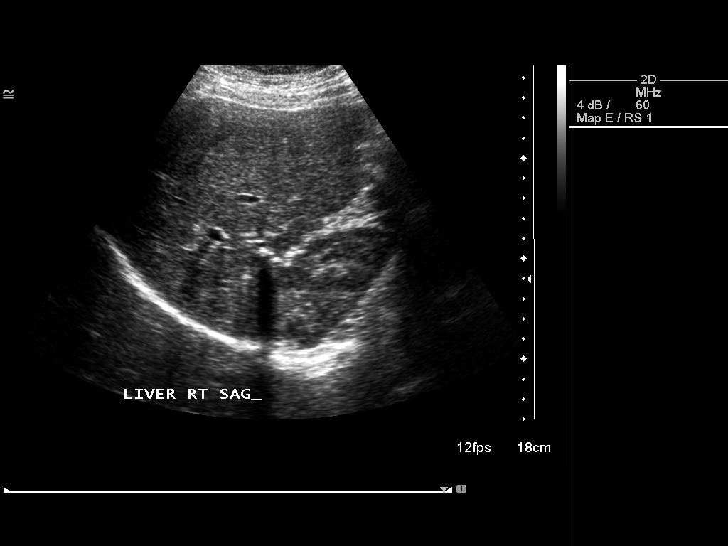
[im 21/63]
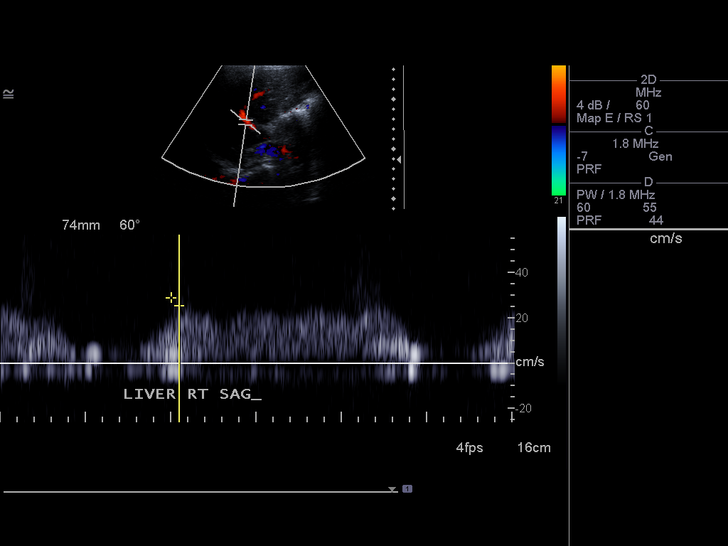
[im 24/63]
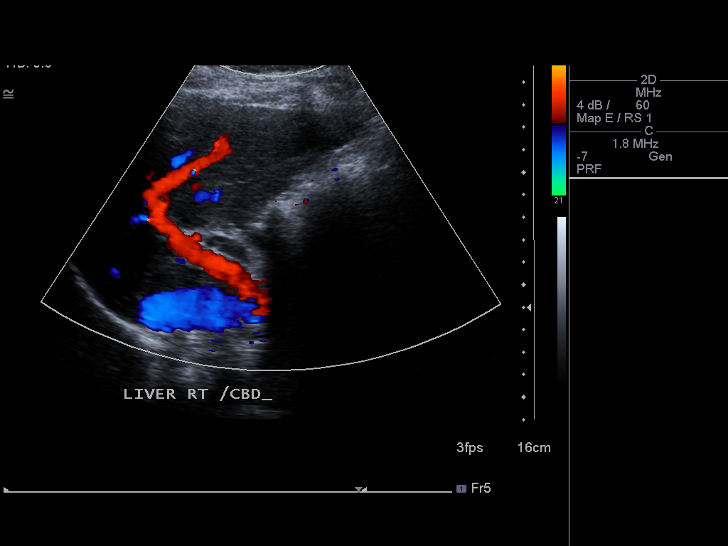
[im 29/63]
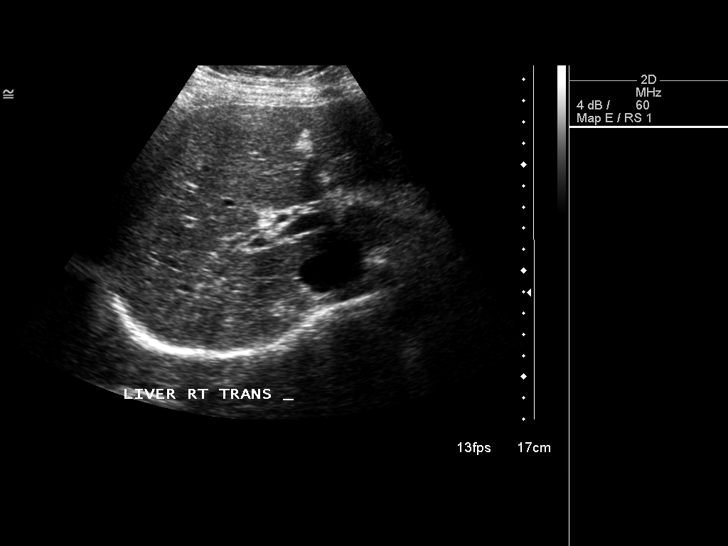
[im 34/63]
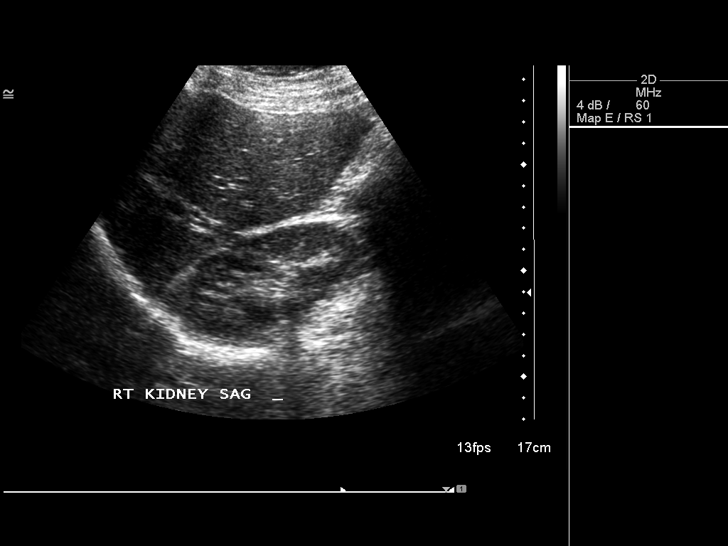
[im 39/63]
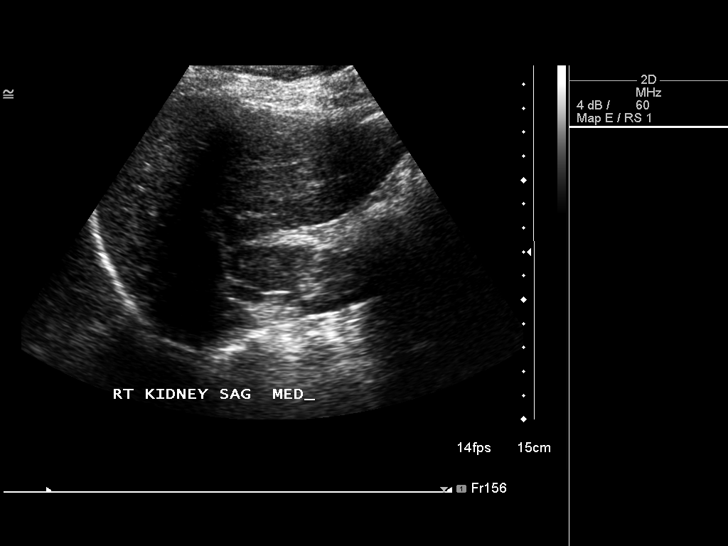
[im 42/63]
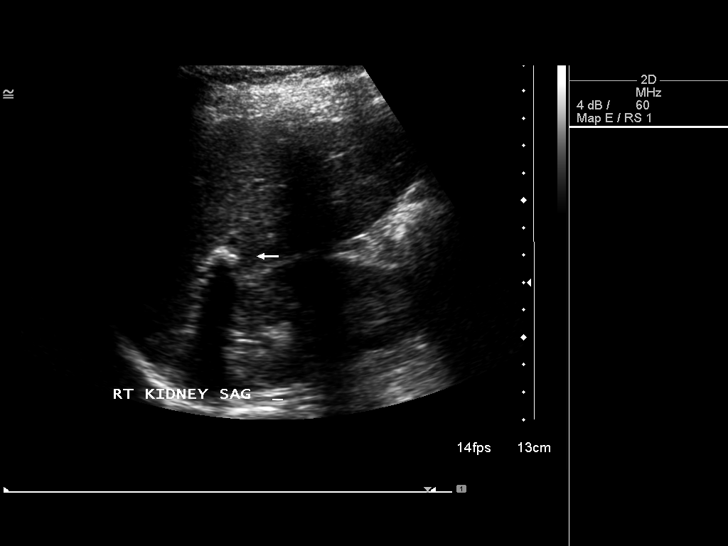
[im 47/63]
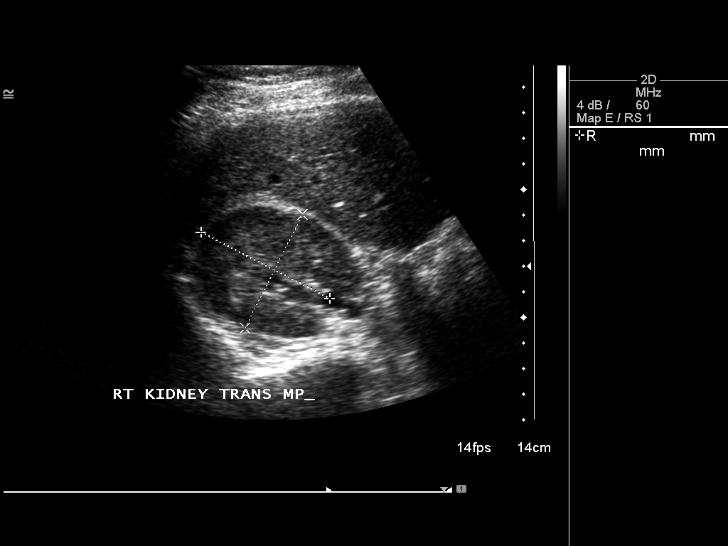
[im 52/63]
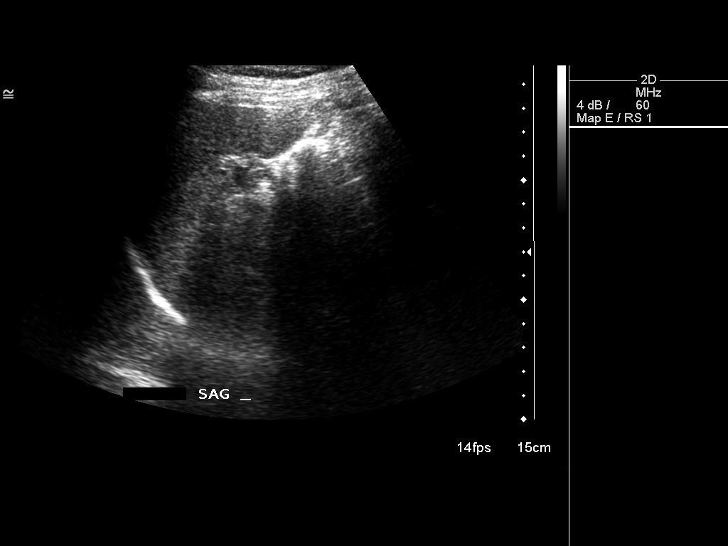
[im 57/63]
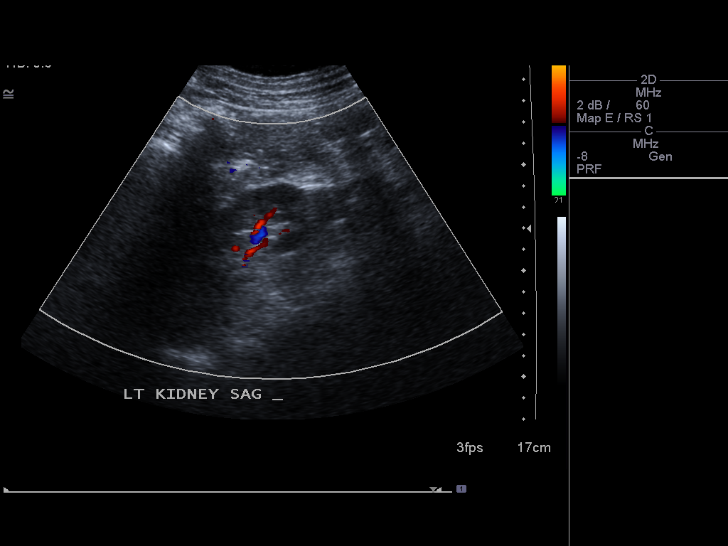
[im 63/63]
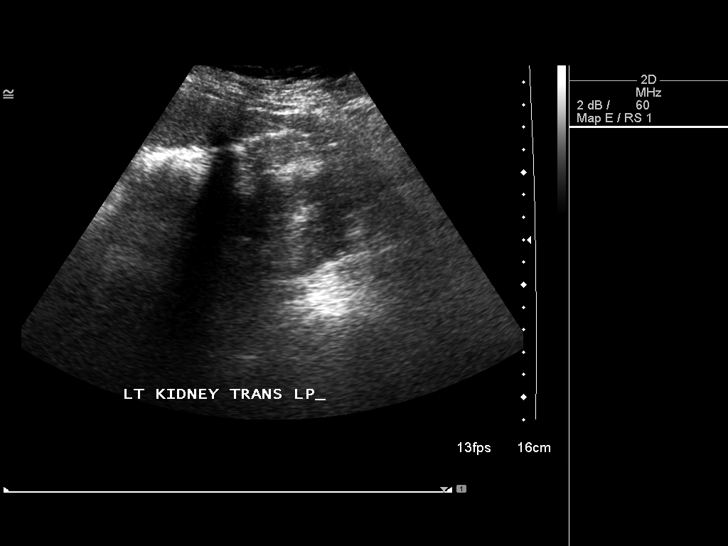

[14 of 25 positions shown; findings below may reference images not displayed]

FINDINGS: Gallbladder: Surgically absent

Common bile duct: Diameter: 7 mm

Liver: No focal lesion identified. Within normal limits in
parenchymal echogenicity.

IVC: No abnormality visualized.

Pancreas: Visualized portion unremarkable.

Spleen: Size and appearance within normal limits.

Right Kidney: Length: 10.0 cm. Echogenicity within normal limits. No
mass or hydronephrosis visualized.

Left Kidney: Length: 10.3 cm. Echogenicity within normal limits. No
mass or hydronephrosis visualized.

Abdominal aorta: No aneurysm visualized.

Other findings: The technologist questioned a possible stone at the
common duct, image 45, but review of these images demonstrates no
typical speckle artifact at color Doppler imaging as would be
expected to be seen with a stone, and the location is not typical.
This most likely represents shadowing from biliary gas in the
presence of cholecystectomy previously.
IMPRESSION: No acute abnormality or etiology to explain the history of elevated
liver function tests.

## 2016-09-11 ENCOUNTER — Other Ambulatory Visit: Payer: Self-pay | Admitting: Infectious Diseases

## 2016-09-11 DIAGNOSIS — B20 Human immunodeficiency virus [HIV] disease: Secondary | ICD-10-CM

## 2016-09-12 ENCOUNTER — Other Ambulatory Visit: Payer: Self-pay | Admitting: Infectious Diseases

## 2016-09-12 DIAGNOSIS — B20 Human immunodeficiency virus [HIV] disease: Secondary | ICD-10-CM

## 2016-09-15 ENCOUNTER — Other Ambulatory Visit: Payer: Self-pay | Admitting: Family Medicine

## 2016-09-15 ENCOUNTER — Ambulatory Visit
Admission: RE | Admit: 2016-09-15 | Discharge: 2016-09-15 | Disposition: A | Discharge status: Home / Self Care | Payer: 59 | Source: Ambulatory Visit | Attending: Family Medicine | Admitting: Family Medicine

## 2016-09-15 DIAGNOSIS — J40 Bronchitis, not specified as acute or chronic: Secondary | ICD-10-CM

## 2016-10-17 DIAGNOSIS — M25511 Pain in right shoulder: Secondary | ICD-10-CM | POA: Diagnosis not present

## 2016-10-17 DIAGNOSIS — M2550 Pain in unspecified joint: Secondary | ICD-10-CM | POA: Diagnosis not present

## 2016-10-17 DIAGNOSIS — M0579 Rheumatoid arthritis with rheumatoid factor of multiple sites without organ or systems involvement: Secondary | ICD-10-CM | POA: Diagnosis not present

## 2016-10-17 DIAGNOSIS — Z79899 Other long term (current) drug therapy: Secondary | ICD-10-CM | POA: Diagnosis not present

## 2016-12-12 DIAGNOSIS — R739 Hyperglycemia, unspecified: Secondary | ICD-10-CM | POA: Diagnosis not present

## 2016-12-12 DIAGNOSIS — R5383 Other fatigue: Secondary | ICD-10-CM | POA: Diagnosis not present

## 2016-12-12 DIAGNOSIS — I10 Essential (primary) hypertension: Secondary | ICD-10-CM | POA: Diagnosis not present

## 2016-12-14 ENCOUNTER — Other Ambulatory Visit: Payer: 59

## 2016-12-14 ENCOUNTER — Other Ambulatory Visit (HOSPITAL_COMMUNITY)
Admission: RE | Admit: 2016-12-14 | Discharge: 2016-12-14 | Disposition: A | Discharge status: Home / Self Care | Payer: 59 | Source: Ambulatory Visit | Attending: Infectious Diseases | Admitting: Infectious Diseases

## 2016-12-14 DIAGNOSIS — Z113 Encounter for screening for infections with a predominantly sexual mode of transmission: Secondary | ICD-10-CM | POA: Insufficient documentation

## 2016-12-14 DIAGNOSIS — B20 Human immunodeficiency virus [HIV] disease: Secondary | ICD-10-CM

## 2016-12-14 DIAGNOSIS — Z79899 Other long term (current) drug therapy: Secondary | ICD-10-CM

## 2016-12-14 LAB — LIPID PANEL
CHOL/HDL RATIO: 3.1 ratio (ref <5.0)
CHOLESTEROL: 182 mg/dL (ref <200)
HDL: 59 mg/dL (ref >50)
LDL Cholesterol: 107 mg/dL — ABNORMAL HIGH (ref <100)
TRIGLYCERIDES: 78 mg/dL (ref <150)
VLDL: 16 mg/dL (ref <30)

## 2016-12-14 LAB — COMPREHENSIVE METABOLIC PANEL
ALT: 32 U/L — ABNORMAL HIGH (ref 6–29)
AST: 39 U/L — ABNORMAL HIGH (ref 10–35)
Albumin: 3.5 g/dL — ABNORMAL LOW (ref 3.6–5.1)
Alkaline Phosphatase: 254 U/L — ABNORMAL HIGH (ref 33–130)
BUN: 10 mg/dL (ref 7–25)
CHLORIDE: 100 mmol/L (ref 98–110)
CO2: 29 mmol/L (ref 20–31)
Calcium: 9.3 mg/dL (ref 8.6–10.4)
Creat: 0.34 mg/dL — ABNORMAL LOW (ref 0.50–1.05)
Glucose, Bld: 92 mg/dL (ref 65–99)
POTASSIUM: 4.1 mmol/L (ref 3.5–5.3)
Sodium: 140 mmol/L (ref 135–146)
TOTAL PROTEIN: 6.5 g/dL (ref 6.1–8.1)
Total Bilirubin: 2.2 mg/dL — ABNORMAL HIGH (ref 0.2–1.2)

## 2016-12-14 LAB — CBC
HCT: 37.2 % (ref 35.0–45.0)
HEMOGLOBIN: 12.6 g/dL (ref 11.7–15.5)
MCH: 29 pg (ref 27.0–33.0)
MCHC: 33.9 g/dL (ref 32.0–36.0)
MCV: 85.7 fL (ref 80.0–100.0)
MPV: 10.3 fL (ref 7.5–12.5)
Platelets: 172 10*3/uL (ref 140–400)
RBC: 4.34 MIL/uL (ref 3.80–5.10)
RDW: 14.8 % (ref 11.0–15.0)
WBC: 6.5 10*3/uL (ref 3.8–10.8)

## 2016-12-15 ENCOUNTER — Other Ambulatory Visit: Payer: 59

## 2016-12-15 LAB — T-HELPER CELL (CD4) - (RCID CLINIC ONLY)
CD4 % Helper T Cell: 15 % — ABNORMAL LOW (ref 33–55)
CD4 T CELL ABS: 410 /uL (ref 400–2700)

## 2016-12-15 LAB — URINE CYTOLOGY ANCILLARY ONLY
CHLAMYDIA, DNA PROBE: NEGATIVE
NEISSERIA GONORRHEA: NEGATIVE

## 2016-12-15 LAB — RPR

## 2016-12-18 LAB — HIV-1 RNA QUANT-NO REFLEX-BLD
HIV 1 RNA QUANT: DETECTED {copies}/mL — AB
HIV-1 RNA Quant, Log: <1.3 Log copies/mL — AB

## 2016-12-19 DIAGNOSIS — E0590 Thyrotoxicosis, unspecified without thyrotoxic crisis or storm: Secondary | ICD-10-CM | POA: Diagnosis not present

## 2016-12-19 DIAGNOSIS — R946 Abnormal results of thyroid function studies: Secondary | ICD-10-CM | POA: Diagnosis not present

## 2016-12-25 ENCOUNTER — Telehealth: Payer: Self-pay | Admitting: *Deleted

## 2016-12-25 DIAGNOSIS — A084 Viral intestinal infection, unspecified: Secondary | ICD-10-CM | POA: Diagnosis not present

## 2016-12-25 NOTE — Telephone Encounter (Signed)
Patient called, requested that her appointment 4/11 be moved to 4/9. Patient concerned regarding weight loss, diarrhea, wants to talk about being on antibiotics again.  Patient was not aware the her appointment 4/11 was cancelled last month (Dr Johnnye Sima out of the office).  Per Dr Johnnye Sima, ok to schedule Friday 4/13 at 9am.  Patient should see her PCP in the mean time. Patient accepted appointment, verbalized understanding of plan. Landis Gandy, RN

## 2016-12-27 ENCOUNTER — Ambulatory Visit: Payer: 59 | Admitting: Infectious Diseases

## 2016-12-29 ENCOUNTER — Encounter: Payer: Self-pay | Admitting: Infectious Diseases

## 2016-12-29 ENCOUNTER — Ambulatory Visit (INDEPENDENT_AMBULATORY_CARE_PROVIDER_SITE_OTHER): Payer: 59 | Admitting: Infectious Diseases

## 2016-12-29 VITALS — BP 128/78 | HR 120 | Temp 98.6°F | Wt 176.0 lb

## 2016-12-29 DIAGNOSIS — B20 Human immunodeficiency virus [HIV] disease: Secondary | ICD-10-CM

## 2016-12-29 DIAGNOSIS — Z79899 Other long term (current) drug therapy: Secondary | ICD-10-CM

## 2016-12-29 DIAGNOSIS — E0590 Thyrotoxicosis, unspecified without thyrotoxic crisis or storm: Secondary | ICD-10-CM

## 2016-12-29 DIAGNOSIS — Z113 Encounter for screening for infections with a predominantly sexual mode of transmission: Secondary | ICD-10-CM | POA: Diagnosis not present

## 2016-12-29 NOTE — Assessment & Plan Note (Signed)
Etiology so far unclear. Await endo eval

## 2016-12-29 NOTE — Progress Notes (Signed)
   Subjective:    Patient ID: Courtney Escobar, female    DOB: 1955-11-18, 61 y.o.   MRN: [id number]  HPI 61 yo F who was seen previously in ID for LTBI in 2011 (HIV - then) as she was being screened for embrel (off since 05-2014).  She was treated with INH at that time.  She also has hx of RA, elevated lipids, chronic hepatitis (elevated alk phos, MR abd 02-27-15 normal). Has had 2 negative liver Bx per pt.  She was seen by her PCP 03-09-15 and had Glc of 200 and was noted to have new HIV+ (check as she had unexplained wt loss ~30# over 6 months). She was also noted to have an oral ulcer and thrush.  Her CD4 was found to be 12. She was seen in ID clinic 6-28, she was started on DTGV/Descovy.  She returned to hospital 7-9 to 7-15 with ARDS, IRIS.  She is now on humira qow for her RA (has been on for last 6 weeks).    Today she has concerns about fatigue, diarrhea (over last week, since resolved), wt loss of 30# over last 2-3 months. Doesn't feel rested after full night of sleep.  She is being see at [facility] for thyroid eval (she was told that her thyroid is very high, overactive). She has endo appt in 1 week.   HIV 1 RNA Quant (copies/mL)  Date Value  12/14/2016 <20 DETECTED (A)  06/30/2016 <20  12/03/2015 34 (H)   CD4 T Cell Abs (/uL)  Date Value  12/14/2016 410  06/30/2016 280 (L)  12/03/2015 150 (L)     Review of Systems  Constitutional: Positive for appetite change, fatigue and unexpected weight change. Negative for chills and fever.  HENT: Positive for postnasal drip.   Respiratory: Negative for cough and shortness of breath.   Gastrointestinal: Negative for blood in stool, constipation and diarrhea.  Genitourinary: Negative for difficulty urinating.  Allergic/Immunologic: Positive for environmental allergies.       Objective:   Physical Exam  Constitutional: She appears well-developed and well-nourished.  HENT:  Mouth/Throat: No oropharyngeal exudate.  Eyes: EOM are  normal. Pupils are equal, round, and reactive to light.  Neck: Neck supple. Thyromegaly present.  Cardiovascular: Regular rhythm.  Tachycardia present.   Murmur heard.  Systolic murmur is present with a grade of 3/6  Pulmonary/Chest: Effort normal and breath sounds normal.  Abdominal: Soft. Bowel sounds are normal. There is no tenderness. There is no rebound.  Musculoskeletal: She exhibits edema.  Trace edema  Lymphadenopathy:    She has no cervical adenopathy.  Psychiatric: She has a normal mood and affect.      Assessment & Plan:

## 2016-12-29 NOTE — Assessment & Plan Note (Addendum)
She is doing very well Will continue her descovy and dtgv Her vax are up to date Has not had colon- has seen Dr Amedeo Plenty- awaiting humira tx? Has seen Dr Amedeo Plenty prior Last mammo/pap within 1 year per pt Has condoms rtc in 6 months

## 2017-01-05 DIAGNOSIS — I10 Essential (primary) hypertension: Secondary | ICD-10-CM | POA: Diagnosis not present

## 2017-01-05 DIAGNOSIS — E0590 Thyrotoxicosis, unspecified without thyrotoxic crisis or storm: Secondary | ICD-10-CM | POA: Diagnosis not present

## 2017-01-08 ENCOUNTER — Other Ambulatory Visit (HOSPITAL_COMMUNITY): Payer: Self-pay | Admitting: Endocrinology

## 2017-01-08 DIAGNOSIS — E0590 Thyrotoxicosis, unspecified without thyrotoxic crisis or storm: Secondary | ICD-10-CM

## 2017-01-18 ENCOUNTER — Encounter (HOSPITAL_COMMUNITY)
Admission: RE | Admit: 2017-01-18 | Discharge: 2017-01-18 | Disposition: A | Discharge status: Home / Self Care | Payer: 59 | Source: Ambulatory Visit | Attending: Endocrinology | Admitting: Endocrinology

## 2017-01-18 DIAGNOSIS — E0590 Thyrotoxicosis, unspecified without thyrotoxic crisis or storm: Secondary | ICD-10-CM | POA: Insufficient documentation

## 2017-01-19 ENCOUNTER — Encounter (HOSPITAL_COMMUNITY)
Admission: RE | Admit: 2017-01-19 | Discharge: 2017-01-19 | Disposition: A | Discharge status: Home / Self Care | Payer: 59 | Source: Ambulatory Visit | Attending: Endocrinology | Admitting: Endocrinology

## 2017-01-19 DIAGNOSIS — E0590 Thyrotoxicosis, unspecified without thyrotoxic crisis or storm: Secondary | ICD-10-CM | POA: Insufficient documentation

## 2017-01-19 MED ORDER — SODIUM PERTECHNETATE TC 99M INJECTION
10.0000 | Freq: Once | INTRAVENOUS | Status: AC | PRN
  Administered 2017-01-19: 10 via INTRAVENOUS

## 2017-01-19 MED ORDER — SODIUM IODIDE I 131 CAPSULE
12.0000 | Freq: Once | INTRAVENOUS | Status: AC | PRN
  Administered 2017-01-19: 12 via ORAL

## 2017-02-07 DIAGNOSIS — E0500 Thyrotoxicosis with diffuse goiter without thyrotoxic crisis or storm: Secondary | ICD-10-CM | POA: Diagnosis not present

## 2017-02-07 DIAGNOSIS — E0590 Thyrotoxicosis, unspecified without thyrotoxic crisis or storm: Secondary | ICD-10-CM | POA: Diagnosis not present

## 2017-02-07 DIAGNOSIS — I10 Essential (primary) hypertension: Secondary | ICD-10-CM | POA: Diagnosis not present

## 2017-02-08 ENCOUNTER — Other Ambulatory Visit (HOSPITAL_COMMUNITY): Payer: Self-pay | Admitting: Endocrinology

## 2017-02-08 DIAGNOSIS — E0590 Thyrotoxicosis, unspecified without thyrotoxic crisis or storm: Secondary | ICD-10-CM

## 2017-02-14 ENCOUNTER — Encounter (HOSPITAL_COMMUNITY)
Admission: RE | Admit: 2017-02-14 | Discharge: 2017-02-14 | Disposition: A | Discharge status: Home / Self Care | Payer: 59 | Source: Ambulatory Visit | Attending: Endocrinology | Admitting: Endocrinology

## 2017-02-14 DIAGNOSIS — E0590 Thyrotoxicosis, unspecified without thyrotoxic crisis or storm: Secondary | ICD-10-CM | POA: Diagnosis present

## 2017-02-14 LAB — HCG, SERUM, QUALITATIVE: Preg, Serum: NEGATIVE

## 2017-02-14 MED ORDER — SODIUM IODIDE I 131 CAPSULE
14.9000 | Freq: Once | INTRAVENOUS | Status: AC | PRN
  Administered 2017-02-14: 14.9 via ORAL

## 2017-03-05 IMAGING — CR DG CHEST 2V
2 series · 2 of 2 positions shown · non-contrast
Comparison: 11/21/2013

CLINICAL DATA: Weight loss, history of TB, former smoker

EXAM:
CHEST  2 VIEW

[w chest pa]
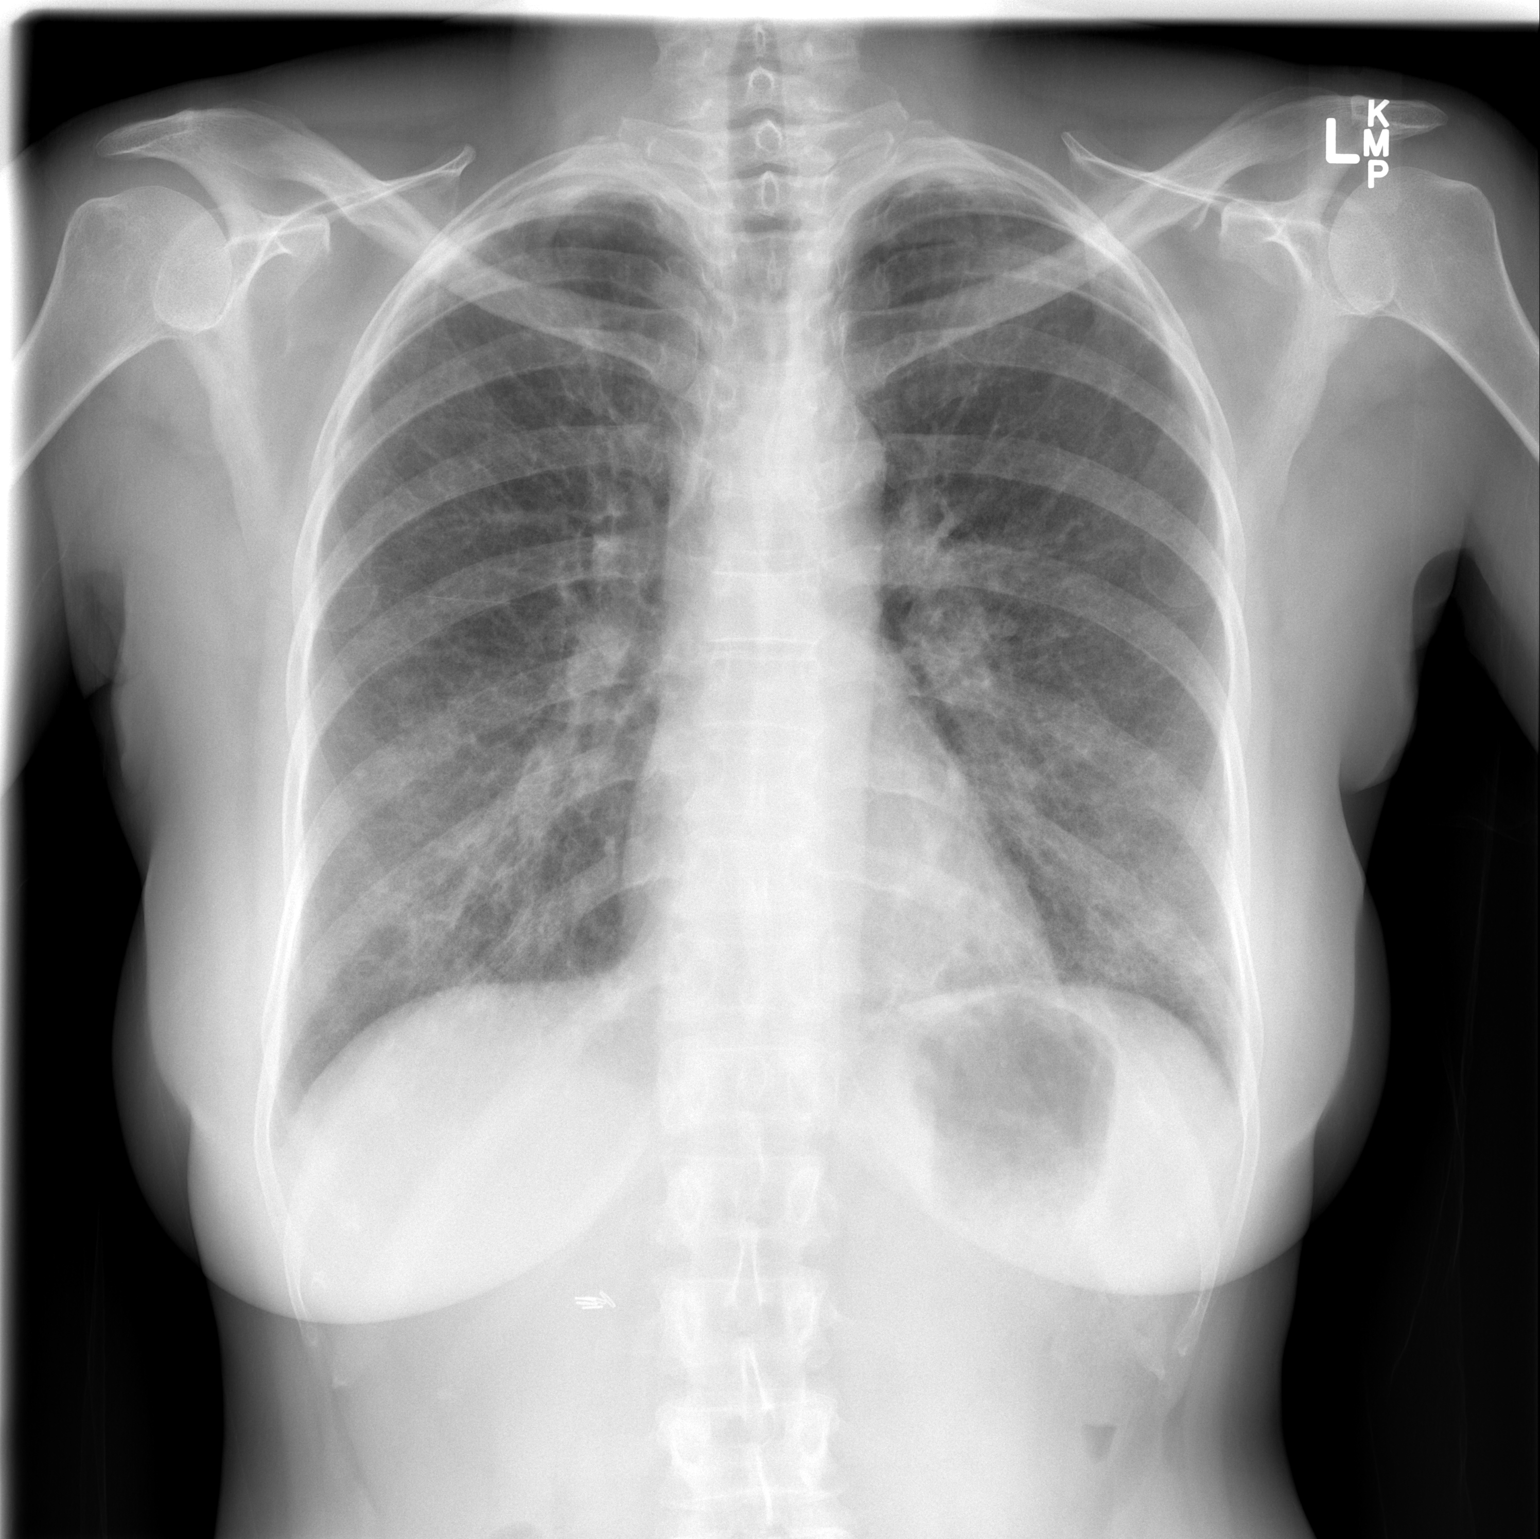

[w chest lat]
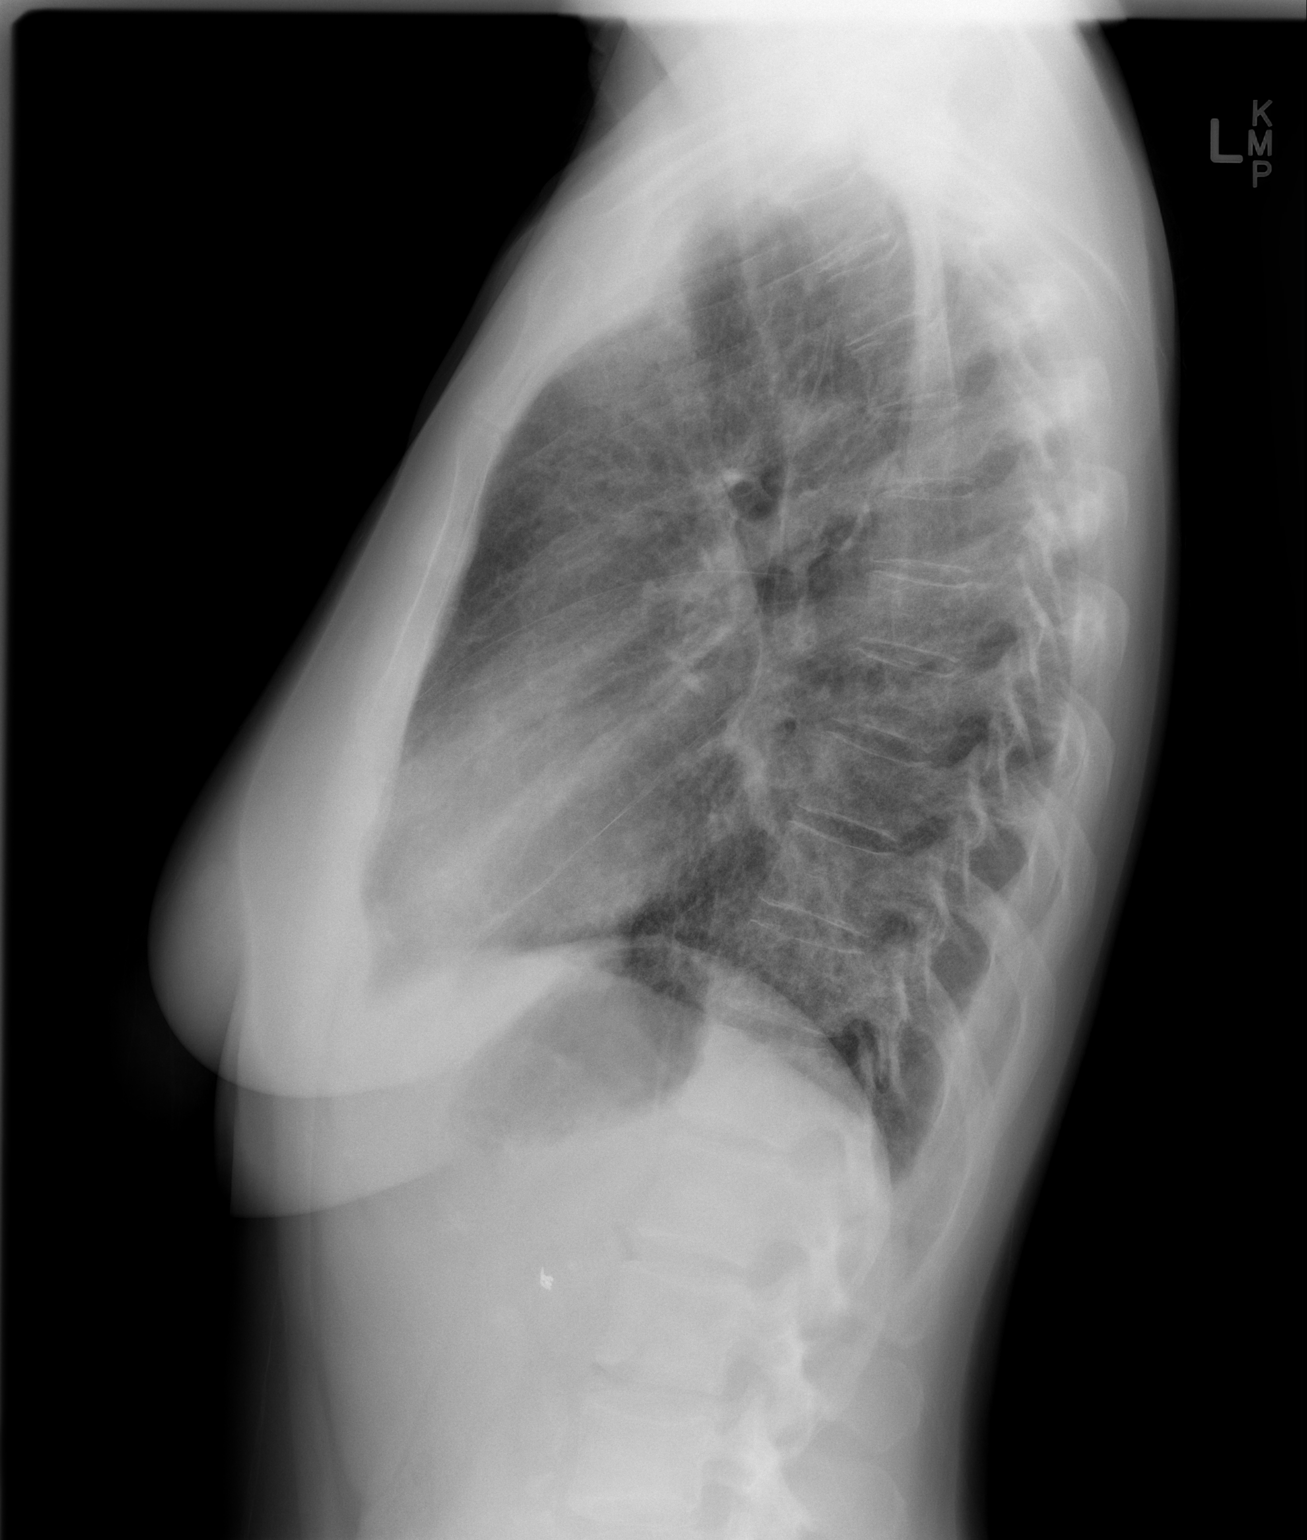

[2 of 2 positions shown; findings below may reference images not displayed]

FINDINGS: Cardiomediastinal silhouette is unremarkable. No pulmonary edema.
Mild perihilar bronchitic changes. Mild interstitial prominence
bilateral lower lobes. Mild pneumonitis or chronic interstitial lung
disease cannot be excluded. Clinical correlation is necessary.
Further correlation with CT scan of the chest could be performed.
IMPRESSION: No pulmonary edema. Mild perihilar bronchitic changes. Mild
interstitial prominence bilateral lower lobes. Mild pneumonitis or
chronic interstitial lung disease cannot be excluded. Clinical
correlation is necessary. Further correlation with CT scan of the
chest could be performed.

## 2017-03-07 ENCOUNTER — Other Ambulatory Visit: Payer: Self-pay | Admitting: *Deleted

## 2017-03-07 ENCOUNTER — Other Ambulatory Visit: Payer: Self-pay | Admitting: Infectious Diseases

## 2017-03-07 DIAGNOSIS — B20 Human immunodeficiency virus [HIV] disease: Secondary | ICD-10-CM

## 2017-03-07 MED ORDER — DOLUTEGRAVIR SODIUM 50 MG PO TABS: 50.0000 mg | ORAL_TABLET | Freq: Every day | ORAL | 1 refills | Status: DC

## 2017-03-07 MED ORDER — EMTRICITABINE-TENOFOVIR AF 200-25 MG PO TABS: 1.0000 | ORAL_TABLET | Freq: Every day | ORAL | 1 refills | Status: DC

## 2017-03-23 DIAGNOSIS — Z79899 Other long term (current) drug therapy: Secondary | ICD-10-CM | POA: Diagnosis not present

## 2017-03-23 DIAGNOSIS — M2550 Pain in unspecified joint: Secondary | ICD-10-CM | POA: Diagnosis not present

## 2017-03-23 DIAGNOSIS — M0579 Rheumatoid arthritis with rheumatoid factor of multiple sites without organ or systems involvement: Secondary | ICD-10-CM | POA: Diagnosis not present

## 2017-04-02 DIAGNOSIS — E0500 Thyrotoxicosis with diffuse goiter without thyrotoxic crisis or storm: Secondary | ICD-10-CM | POA: Diagnosis not present

## 2017-04-09 DIAGNOSIS — E0500 Thyrotoxicosis with diffuse goiter without thyrotoxic crisis or storm: Secondary | ICD-10-CM | POA: Diagnosis not present

## 2017-04-09 DIAGNOSIS — E890 Postprocedural hypothyroidism: Secondary | ICD-10-CM | POA: Diagnosis not present

## 2017-04-09 DIAGNOSIS — I10 Essential (primary) hypertension: Secondary | ICD-10-CM | POA: Diagnosis not present

## 2017-05-07 DIAGNOSIS — E890 Postprocedural hypothyroidism: Secondary | ICD-10-CM | POA: Diagnosis not present

## 2017-06-11 ENCOUNTER — Other Ambulatory Visit: Payer: 59

## 2017-06-11 DIAGNOSIS — B20 Human immunodeficiency virus [HIV] disease: Secondary | ICD-10-CM

## 2017-06-11 DIAGNOSIS — Z113 Encounter for screening for infections with a predominantly sexual mode of transmission: Secondary | ICD-10-CM

## 2017-06-12 LAB — COMPREHENSIVE METABOLIC PANEL
AG RATIO: 1.4 (calc) (ref 1.0–2.5)
ALKALINE PHOSPHATASE (APISO): 208 U/L — AB (ref 33–130)
ALT: 14 U/L (ref 6–29)
AST: 18 U/L (ref 10–35)
Albumin: 4 g/dL (ref 3.6–5.1)
BILIRUBIN TOTAL: 0.9 mg/dL (ref 0.2–1.2)
BUN: 18 mg/dL (ref 7–25)
CALCIUM: 9.1 mg/dL (ref 8.6–10.4)
CHLORIDE: 103 mmol/L (ref 98–110)
CO2: 25 mmol/L (ref 20–32)
Creat: 0.69 mg/dL (ref 0.50–1.05)
GLOBULIN: 2.9 g/dL (ref 1.9–3.7)
Glucose, Bld: 85 mg/dL (ref 65–99)
Potassium: 4.3 mmol/L (ref 3.5–5.3)
Sodium: 139 mmol/L (ref 135–146)
Total Protein: 6.9 g/dL (ref 6.1–8.1)

## 2017-06-12 LAB — CBC
HEMATOCRIT: 36.2 % (ref 35.0–45.0)
HEMOGLOBIN: 12.6 g/dL (ref 11.7–15.5)
MCH: 32.9 pg (ref 27.0–33.0)
MCHC: 34.8 g/dL (ref 32.0–36.0)
MCV: 94.5 fL (ref 80.0–100.0)
MPV: 10.4 fL (ref 7.5–12.5)
Platelets: 157 10*3/uL (ref 140–400)
RBC: 3.83 10*6/uL (ref 3.80–5.10)
RDW: 13.2 % (ref 11.0–15.0)
WBC: 5.6 10*3/uL (ref 3.8–10.8)

## 2017-06-12 LAB — RPR: RPR: NONREACTIVE

## 2017-06-13 LAB — HIV-1 RNA QUANT-NO REFLEX-BLD
HIV 1 RNA QUANT: 26 {copies}/mL — AB
HIV-1 RNA Quant, Log: 1.41 Log copies/mL — ABNORMAL HIGH

## 2017-06-13 LAB — T-HELPER CELL (CD4) - (RCID CLINIC ONLY)
CD4 % Helper T Cell: 20 % — ABNORMAL LOW (ref 33–55)
CD4 T Cell Abs: 450 /uL (ref 400–2700)

## 2017-06-14 DIAGNOSIS — I10 Essential (primary) hypertension: Secondary | ICD-10-CM | POA: Diagnosis not present

## 2017-06-14 DIAGNOSIS — E7800 Pure hypercholesterolemia, unspecified: Secondary | ICD-10-CM | POA: Diagnosis not present

## 2017-06-25 ENCOUNTER — Ambulatory Visit (INDEPENDENT_AMBULATORY_CARE_PROVIDER_SITE_OTHER): Payer: 59 | Admitting: Infectious Diseases

## 2017-06-25 ENCOUNTER — Encounter: Payer: Self-pay | Admitting: Infectious Diseases

## 2017-06-25 VITALS — BP 131/83 | HR 68 | Temp 98.3°F | Wt 179.0 lb

## 2017-06-25 DIAGNOSIS — B20 Human immunodeficiency virus [HIV] disease: Secondary | ICD-10-CM

## 2017-06-25 DIAGNOSIS — Z113 Encounter for screening for infections with a predominantly sexual mode of transmission: Secondary | ICD-10-CM

## 2017-06-25 DIAGNOSIS — M069 Rheumatoid arthritis, unspecified: Secondary | ICD-10-CM | POA: Diagnosis not present

## 2017-06-25 DIAGNOSIS — Z79899 Other long term (current) drug therapy: Secondary | ICD-10-CM

## 2017-06-25 DIAGNOSIS — E0590 Thyrotoxicosis, unspecified without thyrotoxic crisis or storm: Secondary | ICD-10-CM

## 2017-06-25 DIAGNOSIS — Z23 Encounter for immunization: Secondary | ICD-10-CM | POA: Diagnosis not present

## 2017-06-25 NOTE — Assessment & Plan Note (Signed)
appreciate PCP/endo f/u.  She is doing well.

## 2017-06-25 NOTE — Assessment & Plan Note (Signed)
Doing well Blip Flu shot today Gets mammo/pap from PCP (last year) Offered/refused condoms.  Will see her back in 9 months.

## 2017-06-25 NOTE — Progress Notes (Signed)
   Subjective:    Patient ID: Courtney Escobar, female    DOB: 04-18-1956, 61 y.o.   MRN: [id number]  HPI 61 yo F who was seen previously in ID for LTBI in 2011 (HIV - then) as she was being screened for embrel (off since 05-2014). She was treated with INH at that time.  She also has hx of RA, elevated lipids, chronic hepatitis (elevated alk phos, MR abd 02-27-15 normal). Has had 2 negative liver Bx per pt.  She was seen by her PCP 6-21-16and had Glc of 200 and was noted to have new HIV+ (check as she had unexplained wt loss ~30# over 6 months). She was also noted to have an oral ulcer and thrush.  Her CD4 was found to be 12. She was seen in ID clinic 6-28, she was started on DTGV/Descovy.  She returned to hospital 7-9 to 7-15 with ARDS, IRIS.  She is on humira qow for her RA   Now being treated for hyperthyroid. Has been on rx since July. She had radiation initially.  Has been losing hair, thinner hair. Now on synthroid, gaining weight.    HIV 1 RNA Quant (copies/mL)  Date Value  06/11/2017 26 (H)  12/14/2016 <20 DETECTED (A)  06/30/2016 <20   CD4 T Cell Abs (/uL)  Date Value  06/11/2017 450  12/14/2016 410  06/30/2016 280 (L)   Last mammo/PAP- 2017.    Review of Systems  Constitutional: Negative for appetite change, chills, fever and unexpected weight change.  Respiratory: Negative for cough and choking.   Gastrointestinal: Negative for constipation and diarrhea.  Genitourinary: Negative for difficulty urinating and menstrual problem.  Psychiatric/Behavioral: Negative for dysphoric mood.  Please see HPI. 12 point ROS o/w (-)     Objective:   Physical Exam  Constitutional: She appears well-developed and well-nourished.  HENT:  Mouth/Throat: No oropharyngeal exudate.  Eyes: Pupils are equal, round, and reactive to light. EOM are normal.  Neck: Neck supple.  Cardiovascular: Normal rate, regular rhythm and normal heart sounds.   Pulmonary/Chest: Effort normal and  breath sounds normal.  Abdominal: Soft. Bowel sounds are normal. There is no tenderness. There is no rebound.  Musculoskeletal: She exhibits edema.  Lymphadenopathy:    She has no cervical adenopathy.  Skin: Skin is warm and dry.  Psychiatric: She has a normal mood and affect.          Assessment & Plan:

## 2017-06-25 NOTE — Assessment & Plan Note (Signed)
Continues on humira via rheum.

## 2017-07-09 DIAGNOSIS — E890 Postprocedural hypothyroidism: Secondary | ICD-10-CM | POA: Diagnosis not present

## 2017-07-13 DIAGNOSIS — I10 Essential (primary) hypertension: Secondary | ICD-10-CM | POA: Diagnosis not present

## 2017-07-13 DIAGNOSIS — E890 Postprocedural hypothyroidism: Secondary | ICD-10-CM | POA: Diagnosis not present

## 2017-08-03 DIAGNOSIS — M2550 Pain in unspecified joint: Secondary | ICD-10-CM | POA: Diagnosis not present

## 2017-08-03 DIAGNOSIS — Z79899 Other long term (current) drug therapy: Secondary | ICD-10-CM | POA: Diagnosis not present

## 2017-08-03 DIAGNOSIS — M0579 Rheumatoid arthritis with rheumatoid factor of multiple sites without organ or systems involvement: Secondary | ICD-10-CM | POA: Diagnosis not present

## 2017-08-24 DIAGNOSIS — E890 Postprocedural hypothyroidism: Secondary | ICD-10-CM | POA: Diagnosis not present

## 2017-08-28 DIAGNOSIS — Z124 Encounter for screening for malignant neoplasm of cervix: Secondary | ICD-10-CM | POA: Diagnosis not present

## 2017-08-28 DIAGNOSIS — Z01419 Encounter for gynecological examination (general) (routine) without abnormal findings: Secondary | ICD-10-CM | POA: Diagnosis not present

## 2017-08-28 DIAGNOSIS — Z1231 Encounter for screening mammogram for malignant neoplasm of breast: Secondary | ICD-10-CM | POA: Diagnosis not present

## 2017-08-31 ENCOUNTER — Other Ambulatory Visit: Payer: Self-pay | Admitting: Infectious Diseases

## 2017-08-31 DIAGNOSIS — B20 Human immunodeficiency virus [HIV] disease: Secondary | ICD-10-CM

## 2017-10-02 DIAGNOSIS — R87612 Low grade squamous intraepithelial lesion on cytologic smear of cervix (LGSIL): Secondary | ICD-10-CM | POA: Diagnosis not present

## 2017-10-26 DIAGNOSIS — E890 Postprocedural hypothyroidism: Secondary | ICD-10-CM | POA: Diagnosis not present

## 2017-12-10 DIAGNOSIS — R229 Localized swelling, mass and lump, unspecified: Secondary | ICD-10-CM | POA: Diagnosis not present

## 2017-12-10 DIAGNOSIS — E7800 Pure hypercholesterolemia, unspecified: Secondary | ICD-10-CM | POA: Diagnosis not present

## 2017-12-10 DIAGNOSIS — I10 Essential (primary) hypertension: Secondary | ICD-10-CM | POA: Diagnosis not present

## 2017-12-12 DIAGNOSIS — E890 Postprocedural hypothyroidism: Secondary | ICD-10-CM | POA: Diagnosis not present

## 2018-01-21 DIAGNOSIS — E890 Postprocedural hypothyroidism: Secondary | ICD-10-CM | POA: Diagnosis not present

## 2018-01-21 DIAGNOSIS — I10 Essential (primary) hypertension: Secondary | ICD-10-CM | POA: Diagnosis not present

## 2018-01-23 DIAGNOSIS — L308 Other specified dermatitis: Secondary | ICD-10-CM | POA: Diagnosis not present

## 2018-01-31 DIAGNOSIS — M0579 Rheumatoid arthritis with rheumatoid factor of multiple sites without organ or systems involvement: Secondary | ICD-10-CM | POA: Diagnosis not present

## 2018-01-31 DIAGNOSIS — Z79899 Other long term (current) drug therapy: Secondary | ICD-10-CM | POA: Diagnosis not present

## 2018-01-31 DIAGNOSIS — M2550 Pain in unspecified joint: Secondary | ICD-10-CM | POA: Diagnosis not present

## 2018-02-22 ENCOUNTER — Other Ambulatory Visit: Payer: Self-pay | Admitting: Infectious Diseases

## 2018-02-22 DIAGNOSIS — B20 Human immunodeficiency virus [HIV] disease: Secondary | ICD-10-CM

## 2018-02-27 ENCOUNTER — Other Ambulatory Visit: Payer: 59

## 2018-02-27 ENCOUNTER — Other Ambulatory Visit (HOSPITAL_COMMUNITY)
Admission: RE | Admit: 2018-02-27 | Discharge: 2018-02-27 | Disposition: A | Discharge status: Home / Self Care | Payer: 59 | Source: Ambulatory Visit | Attending: Infectious Diseases | Admitting: Infectious Diseases

## 2018-02-27 DIAGNOSIS — E0590 Thyrotoxicosis, unspecified without thyrotoxic crisis or storm: Secondary | ICD-10-CM | POA: Insufficient documentation

## 2018-02-27 DIAGNOSIS — Z113 Encounter for screening for infections with a predominantly sexual mode of transmission: Secondary | ICD-10-CM | POA: Insufficient documentation

## 2018-02-27 DIAGNOSIS — B20 Human immunodeficiency virus [HIV] disease: Secondary | ICD-10-CM

## 2018-02-27 DIAGNOSIS — Z79899 Other long term (current) drug therapy: Secondary | ICD-10-CM

## 2018-02-27 DIAGNOSIS — M069 Rheumatoid arthritis, unspecified: Secondary | ICD-10-CM | POA: Diagnosis not present

## 2018-02-28 LAB — CBC
HCT: 36.4 % (ref 35.0–45.0)
Hemoglobin: 12.8 g/dL (ref 11.7–15.5)
MCH: 32.5 pg (ref 27.0–33.0)
MCHC: 35.2 g/dL (ref 32.0–36.0)
MCV: 92.4 fL (ref 80.0–100.0)
MPV: 10.5 fL (ref 7.5–12.5)
PLATELETS: 155 10*3/uL (ref 140–400)
RBC: 3.94 10*6/uL (ref 3.80–5.10)
RDW: 12.9 % (ref 11.0–15.0)
WBC: 5.9 10*3/uL (ref 3.8–10.8)

## 2018-02-28 LAB — LIPID PANEL
Cholesterol: 251 mg/dL — ABNORMAL HIGH (ref <200)
HDL: 95 mg/dL (ref >50)
LDL Cholesterol (Calc): 139 mg/dL (calc) — ABNORMAL HIGH
Non-HDL Cholesterol (Calc): 156 mg/dL (calc) — ABNORMAL HIGH (ref <130)
Total CHOL/HDL Ratio: 2.6 (calc) (ref <5.0)
Triglycerides: 74 mg/dL (ref <150)

## 2018-02-28 LAB — COMPREHENSIVE METABOLIC PANEL
AG RATIO: 1.5 (calc) (ref 1.0–2.5)
ALT: 15 U/L (ref 6–29)
AST: 20 U/L (ref 10–35)
Albumin: 4.1 g/dL (ref 3.6–5.1)
Alkaline phosphatase (APISO): 201 U/L — ABNORMAL HIGH (ref 33–130)
BUN: 15 mg/dL (ref 7–25)
CHLORIDE: 103 mmol/L (ref 98–110)
CO2: 29 mmol/L (ref 20–32)
Calcium: 9.1 mg/dL (ref 8.6–10.4)
Creat: 0.92 mg/dL (ref 0.50–0.99)
GLUCOSE: 84 mg/dL (ref 65–99)
Globulin: 2.8 g/dL (calc) (ref 1.9–3.7)
POTASSIUM: 4.1 mmol/L (ref 3.5–5.3)
SODIUM: 138 mmol/L (ref 135–146)
TOTAL PROTEIN: 6.9 g/dL (ref 6.1–8.1)
Total Bilirubin: 0.7 mg/dL (ref 0.2–1.2)

## 2018-02-28 LAB — URINE CYTOLOGY ANCILLARY ONLY
Chlamydia: NEGATIVE
Neisseria Gonorrhea: NEGATIVE

## 2018-02-28 LAB — T-HELPER CELL (CD4) - (RCID CLINIC ONLY)
CD4 % Helper T Cell: 22 % — ABNORMAL LOW (ref 33–55)
CD4 T Cell Abs: 590 /uL (ref 400–2700)

## 2018-02-28 LAB — RPR: RPR Ser Ql: NONREACTIVE

## 2018-03-01 LAB — HIV-1 RNA QUANT-NO REFLEX-BLD
HIV 1 RNA Quant: <20 copies/mL — AB
HIV-1 RNA Quant, Log: <1.3 Log copies/mL — AB

## 2018-03-11 DIAGNOSIS — Z79899 Other long term (current) drug therapy: Secondary | ICD-10-CM | POA: Diagnosis not present

## 2018-03-11 DIAGNOSIS — I872 Venous insufficiency (chronic) (peripheral): Secondary | ICD-10-CM | POA: Diagnosis not present

## 2018-03-11 DIAGNOSIS — I890 Lymphedema, not elsewhere classified: Secondary | ICD-10-CM | POA: Diagnosis not present

## 2018-03-13 ENCOUNTER — Ambulatory Visit (INDEPENDENT_AMBULATORY_CARE_PROVIDER_SITE_OTHER): Payer: 59 | Admitting: Infectious Diseases

## 2018-03-13 VITALS — BP 129/81 | HR 67 | Temp 97.9°F | Wt 203.0 lb

## 2018-03-13 DIAGNOSIS — Z113 Encounter for screening for infections with a predominantly sexual mode of transmission: Secondary | ICD-10-CM | POA: Diagnosis not present

## 2018-03-13 DIAGNOSIS — M069 Rheumatoid arthritis, unspecified: Secondary | ICD-10-CM

## 2018-03-13 DIAGNOSIS — E0590 Thyrotoxicosis, unspecified without thyrotoxic crisis or storm: Secondary | ICD-10-CM | POA: Diagnosis not present

## 2018-03-13 DIAGNOSIS — Z79899 Other long term (current) drug therapy: Secondary | ICD-10-CM | POA: Diagnosis not present

## 2018-03-13 DIAGNOSIS — B20 Human immunodeficiency virus [HIV] disease: Secondary | ICD-10-CM

## 2018-03-13 DIAGNOSIS — E785 Hyperlipidemia, unspecified: Secondary | ICD-10-CM | POA: Diagnosis not present

## 2018-03-13 DIAGNOSIS — E669 Obesity, unspecified: Secondary | ICD-10-CM | POA: Diagnosis not present

## 2018-03-13 MED ORDER — BICTEGRAVIR-EMTRICITAB-TENOFOV 50-200-25 MG PO TABS: 1.0000 | ORAL_TABLET | Freq: Every day | ORAL | 3 refills | Status: DC

## 2018-03-13 NOTE — Assessment & Plan Note (Signed)
Appreciate her PCP f/u.   

## 2018-03-13 NOTE — Progress Notes (Signed)
   Subjective:    Patient ID: Courtney Escobar, female    DOB: December 13, 1955, 62 y.o.   MRN: [id number]  HPI 62yo F who was seen previously in ID for LTBI in 2011 (HIV - then) as she was being screened for embrel (off since 05-2014). She was treated with INH at that time.  She also has hx of RA, elevated lipids, chronic hepatitis (elevated alk phos, MR abd 02-27-15 normal). Has had 2 negative liver Bx per pt.  She was seen by her PCP 6-21-16and had Glc of 200 and was noted to have new HIV+ (checked as she had unexplained wt loss ~30# over 6 months). She was also noted to have an oral ulcer and thrush.  Her CD4 was found to be 12. She was seen in ID clinic 6-28, she was started on DTGV/Descovy.  She returned to hospital 03-27-15 to 04-02-15 with ARDS, IRIS.  She has been treated in the last 2 years for hyperthyroid. She has been getting her thyroxine adjusted.  No problems with descovy-tivicay.  Has been feeling well.   Is going to schedule a colonoscopy with Dr Amedeo Plenty.  Had mammo/pap 10-2017.   HIV 1 RNA Quant (copies/mL)  Date Value  02/27/2018 <20 DETECTED (A)  06/11/2017 26 (H)  12/14/2016 <20 DETECTED (A)   CD4 T Cell Abs (/uL)  Date Value  02/27/2018 590  06/11/2017 450  12/14/2016 410    Review of Systems  Constitutional: Negative for appetite change and unexpected weight change.  Gastrointestinal: Negative for constipation and diarrhea.  Genitourinary: Negative for difficulty urinating and menstrual problem.  Psychiatric/Behavioral: Negative for sleep disturbance.  Please see HPI. All other systems reviewed and negative.      Objective:   Physical Exam  Constitutional: She is oriented to person, place, and time. She appears well-developed and well-nourished.  HENT:  Mouth/Throat: No oropharyngeal exudate.  Eyes: Pupils are equal, round, and reactive to light. EOM are normal.  Neck: Normal range of motion. Neck supple.  Cardiovascular: Normal rate, regular rhythm and  normal heart sounds.  Pulmonary/Chest: Effort normal and breath sounds normal.  Abdominal: Soft. Bowel sounds are normal. There is no tenderness. There is no guarding.  Musculoskeletal: Normal range of motion.  Lymphadenopathy:    She has no cervical adenopathy.  Neurological: She is alert and oriented to person, place, and time.  Psychiatric: She has a normal mood and affect.       Assessment & Plan:

## 2018-03-13 NOTE — Assessment & Plan Note (Signed)
Encouraged her to diet and exercise. Will be difficult with her thyroid issues.

## 2018-03-13 NOTE — Assessment & Plan Note (Signed)
Has f/u with PCP Is working on diet and exercise.

## 2018-03-13 NOTE — Assessment & Plan Note (Signed)
Continues on humira.  Doing well.

## 2018-03-13 NOTE — Assessment & Plan Note (Signed)
defers TDap, prevnar (doesn't want prevnar).  Needs colon Will change her meds to biktarvy  Will see her back in 4-5 months Offered/refused condoms.

## 2018-03-20 ENCOUNTER — Other Ambulatory Visit: Payer: Self-pay | Admitting: *Deleted

## 2018-03-20 DIAGNOSIS — L409 Psoriasis, unspecified: Secondary | ICD-10-CM | POA: Insufficient documentation

## 2018-03-20 NOTE — Progress Notes (Signed)
Patient called to confirm her new prescription was sent to [facility].  [facility] stated they did not have it when she called last week. RN noted it was received 6/26.  Patient will call the pharmacy again. RN noted outside medications were waiting reconciliation.  Per patient, she is taking voltaren, enbrel, and a topical cream in addition to her thyroid, metoprolol, and hiv therapy. medication list updated. Landis Gandy, RN

## 2018-03-25 DIAGNOSIS — E890 Postprocedural hypothyroidism: Secondary | ICD-10-CM | POA: Diagnosis not present

## 2018-05-02 DIAGNOSIS — M0579 Rheumatoid arthritis with rheumatoid factor of multiple sites without organ or systems involvement: Secondary | ICD-10-CM | POA: Diagnosis not present

## 2018-05-02 DIAGNOSIS — M79671 Pain in right foot: Secondary | ICD-10-CM | POA: Diagnosis not present

## 2018-05-02 DIAGNOSIS — Z79899 Other long term (current) drug therapy: Secondary | ICD-10-CM | POA: Diagnosis not present

## 2018-05-02 DIAGNOSIS — M2550 Pain in unspecified joint: Secondary | ICD-10-CM | POA: Diagnosis not present

## 2018-05-22 ENCOUNTER — Other Ambulatory Visit: Payer: Self-pay | Admitting: Infectious Diseases

## 2018-05-22 DIAGNOSIS — B20 Human immunodeficiency virus [HIV] disease: Secondary | ICD-10-CM

## 2018-05-23 ENCOUNTER — Other Ambulatory Visit: Payer: Self-pay | Admitting: Infectious Diseases

## 2018-05-23 DIAGNOSIS — B20 Human immunodeficiency virus [HIV] disease: Secondary | ICD-10-CM

## 2018-05-24 ENCOUNTER — Telehealth: Payer: Self-pay | Admitting: Behavioral Health

## 2018-05-24 DIAGNOSIS — B20 Human immunodeficiency virus [HIV] disease: Secondary | ICD-10-CM

## 2018-05-24 MED ORDER — DOLUTEGRAVIR SODIUM 50 MG PO TABS: 50.0000 mg | ORAL_TABLET | Freq: Every day | ORAL | 2 refills | Status: DC

## 2018-05-24 MED ORDER — EMTRICITABINE-TENOFOVIR AF 200-25 MG PO TABS: 1.0000 | ORAL_TABLET | Freq: Every day | ORAL | 2 refills | Status: DC

## 2018-05-24 NOTE — Telephone Encounter (Signed)
Thanks Caryl Pina Can you refill her meds (tivicay and descovey)? thanks

## 2018-05-24 NOTE — Telephone Encounter (Signed)
Received electronic rx request from [facility] on [address] for Courtney Escobar.  Per Dr. Algis Downs office note patient was to switch to [facility] at last office visit.  Called patient and she states she never switched to [facility] because she would have a 16 dollar co pay.  Explained to Courtney Escobar that we have Co-pay cards available to cover co-pays.  She verbalized understanding but states she would rather take the two pills, tivicay and descovy. Patient has 7 days worth of both medications today Friday 05/24/2018. Pricilla Riffle RN

## 2018-05-24 NOTE — Telephone Encounter (Signed)
Done. Thanks.

## 2018-05-24 NOTE — Addendum Note (Signed)
Addended by: Landis Gandy on: 05/24/2018 04:48 PM   Modules accepted: Orders

## 2018-06-12 DIAGNOSIS — Z23 Encounter for immunization: Secondary | ICD-10-CM | POA: Diagnosis not present

## 2018-06-12 DIAGNOSIS — I10 Essential (primary) hypertension: Secondary | ICD-10-CM | POA: Diagnosis not present

## 2018-06-12 DIAGNOSIS — E039 Hypothyroidism, unspecified: Secondary | ICD-10-CM | POA: Diagnosis not present

## 2018-06-12 DIAGNOSIS — E7800 Pure hypercholesterolemia, unspecified: Secondary | ICD-10-CM | POA: Diagnosis not present

## 2018-06-16 DIAGNOSIS — R109 Unspecified abdominal pain: Secondary | ICD-10-CM | POA: Diagnosis not present

## 2018-06-16 DIAGNOSIS — K5900 Constipation, unspecified: Secondary | ICD-10-CM | POA: Diagnosis not present

## 2018-06-19 ENCOUNTER — Other Ambulatory Visit: Payer: Self-pay | Admitting: Gastroenterology

## 2018-06-19 ENCOUNTER — Ambulatory Visit
Admission: RE | Admit: 2018-06-19 | Discharge: 2018-06-19 | Disposition: A | Discharge status: Home / Self Care | Payer: 59 | Source: Ambulatory Visit | Attending: Gastroenterology | Admitting: Gastroenterology

## 2018-06-19 DIAGNOSIS — R140 Abdominal distension (gaseous): Secondary | ICD-10-CM | POA: Diagnosis not present

## 2018-06-19 DIAGNOSIS — R112 Nausea with vomiting, unspecified: Secondary | ICD-10-CM

## 2018-06-19 DIAGNOSIS — K5900 Constipation, unspecified: Secondary | ICD-10-CM | POA: Diagnosis not present

## 2018-07-17 DIAGNOSIS — K5900 Constipation, unspecified: Secondary | ICD-10-CM | POA: Diagnosis not present

## 2018-07-17 DIAGNOSIS — D696 Thrombocytopenia, unspecified: Secondary | ICD-10-CM | POA: Diagnosis not present

## 2018-07-17 DIAGNOSIS — R748 Abnormal levels of other serum enzymes: Secondary | ICD-10-CM | POA: Diagnosis not present

## 2018-07-17 DIAGNOSIS — R140 Abdominal distension (gaseous): Secondary | ICD-10-CM | POA: Diagnosis not present

## 2018-07-23 ENCOUNTER — Other Ambulatory Visit: Payer: Self-pay | Admitting: Gastroenterology

## 2018-07-23 DIAGNOSIS — R748 Abnormal levels of other serum enzymes: Secondary | ICD-10-CM

## 2018-07-26 ENCOUNTER — Ambulatory Visit
Admission: RE | Admit: 2018-07-26 | Discharge: 2018-07-26 | Disposition: A | Discharge status: Home / Self Care | Payer: 59 | Source: Ambulatory Visit | Attending: Gastroenterology | Admitting: Gastroenterology

## 2018-07-26 DIAGNOSIS — R748 Abnormal levels of other serum enzymes: Secondary | ICD-10-CM

## 2018-07-29 DIAGNOSIS — I10 Essential (primary) hypertension: Secondary | ICD-10-CM | POA: Diagnosis not present

## 2018-07-29 DIAGNOSIS — E890 Postprocedural hypothyroidism: Secondary | ICD-10-CM | POA: Diagnosis not present

## 2018-08-20 ENCOUNTER — Other Ambulatory Visit: Payer: Self-pay | Admitting: Infectious Diseases

## 2018-08-20 DIAGNOSIS — B20 Human immunodeficiency virus [HIV] disease: Secondary | ICD-10-CM

## 2018-08-21 ENCOUNTER — Other Ambulatory Visit (HOSPITAL_COMMUNITY)
Admission: RE | Admit: 2018-08-21 | Discharge: 2018-08-21 | Disposition: A | Discharge status: Home / Self Care | Payer: 59 | Source: Ambulatory Visit | Attending: Infectious Diseases | Admitting: Infectious Diseases

## 2018-08-21 ENCOUNTER — Other Ambulatory Visit: Payer: 59

## 2018-08-21 DIAGNOSIS — Z79899 Other long term (current) drug therapy: Secondary | ICD-10-CM | POA: Diagnosis not present

## 2018-08-21 DIAGNOSIS — Z113 Encounter for screening for infections with a predominantly sexual mode of transmission: Secondary | ICD-10-CM

## 2018-08-21 DIAGNOSIS — B20 Human immunodeficiency virus [HIV] disease: Secondary | ICD-10-CM

## 2018-08-22 LAB — URINE CYTOLOGY ANCILLARY ONLY
Chlamydia: NEGATIVE
Neisseria Gonorrhea: NEGATIVE

## 2018-08-23 LAB — CBC
HCT: 38.1 % (ref 35.0–45.0)
Hemoglobin: 13.2 g/dL (ref 11.7–15.5)
MCH: 31.7 pg (ref 27.0–33.0)
MCHC: 34.6 g/dL (ref 32.0–36.0)
MCV: 91.6 fL (ref 80.0–100.0)
MPV: 10.4 fL (ref 7.5–12.5)
PLATELETS: 171 10*3/uL (ref 140–400)
RBC: 4.16 10*6/uL (ref 3.80–5.10)
RDW: 13.4 % (ref 11.0–15.0)
WBC: 7.3 10*3/uL (ref 3.8–10.8)

## 2018-08-23 LAB — COMPREHENSIVE METABOLIC PANEL
AG Ratio: 1.5 (calc) (ref 1.0–2.5)
ALT: 16 U/L (ref 6–29)
AST: 20 U/L (ref 10–35)
Albumin: 4.4 g/dL (ref 3.6–5.1)
Alkaline phosphatase (APISO): 164 U/L — ABNORMAL HIGH (ref 33–130)
BUN: 16 mg/dL (ref 7–25)
CO2: 29 mmol/L (ref 20–32)
Calcium: 9.5 mg/dL (ref 8.6–10.4)
Chloride: 101 mmol/L (ref 98–110)
Creat: 0.85 mg/dL (ref 0.50–0.99)
GLUCOSE: 85 mg/dL (ref 65–99)
Globulin: 3 g/dL (calc) (ref 1.9–3.7)
Potassium: 4.5 mmol/L (ref 3.5–5.3)
SODIUM: 138 mmol/L (ref 135–146)
Total Bilirubin: 0.6 mg/dL (ref 0.2–1.2)
Total Protein: 7.4 g/dL (ref 6.1–8.1)

## 2018-08-23 LAB — LIPID PANEL
Cholesterol: 278 mg/dL — ABNORMAL HIGH (ref <200)
HDL: 110 mg/dL (ref >50)
LDL Cholesterol (Calc): 150 mg/dL (calc) — ABNORMAL HIGH
Non-HDL Cholesterol (Calc): 168 mg/dL (calc) — ABNORMAL HIGH (ref <130)
Total CHOL/HDL Ratio: 2.5 (calc) (ref <5.0)
Triglycerides: 79 mg/dL (ref <150)

## 2018-08-23 LAB — T-HELPER CELL (CD4) - (RCID CLINIC ONLY)
CD4 % Helper T Cell: 21 % — ABNORMAL LOW (ref 33–55)
CD4 T CELL ABS: 690 /uL (ref 400–2700)

## 2018-08-23 LAB — RPR: RPR Ser Ql: NONREACTIVE

## 2018-08-23 LAB — HIV-1 RNA QUANT-NO REFLEX-BLD
HIV 1 RNA Quant: 28 copies/mL — ABNORMAL HIGH
HIV-1 RNA Quant, Log: 1.45 Log copies/mL — ABNORMAL HIGH

## 2018-09-05 ENCOUNTER — Ambulatory Visit (INDEPENDENT_AMBULATORY_CARE_PROVIDER_SITE_OTHER): Payer: 59 | Admitting: Infectious Diseases

## 2018-09-05 VITALS — BP 149/95 | HR 91 | Temp 98.0°F | Wt 215.0 lb

## 2018-09-05 DIAGNOSIS — E669 Obesity, unspecified: Secondary | ICD-10-CM

## 2018-09-05 DIAGNOSIS — Z79899 Other long term (current) drug therapy: Secondary | ICD-10-CM | POA: Diagnosis not present

## 2018-09-05 DIAGNOSIS — Z113 Encounter for screening for infections with a predominantly sexual mode of transmission: Secondary | ICD-10-CM

## 2018-09-05 DIAGNOSIS — B20 Human immunodeficiency virus [HIV] disease: Secondary | ICD-10-CM | POA: Diagnosis not present

## 2018-09-05 NOTE — Progress Notes (Signed)
   Subjective:    Patient ID: Courtney Escobar, female    DOB: 12-10-1955, 62 y.o.   MRN: [id number]  HPI 62yo F who was seen previously in ID for LTBI in 2011 (HIV - then) as she was being screened for embrel (off since 05-2014). She was treated with INH at that time.  She also has hx of RA, elevated lipids, chronic hepatitis (elevated alk phos, MR abd 02-27-15 normal). Has had 2 negative liver Bx per pt.  She was seen by her PCP 6-21-16noted to have new HIV+ (checked as she had unexplained wt loss ~30# over 6 months). She was also noted to have an oral ulcer and thrush.  Her CD4 was found to be 12. She was seen in ID clinic 6-28, she was started on tivicay/Descovy.  She returned to hospital 03-27-15 to 04-02-15 with ARDS, IRIS.  She has being rx hyperthyroid. Has gotten XRT. She has been getting her thyroxine adjusted.  Was doing well with descovy-tivicay, deferred changing to biktarvy.  Has been feeling well.  Wants to lose wt.  Today is birthday, going to [facility] for w/e see [employer] bowl game.   Is going to schedule a colonoscopy in Jan, has called and seen GI.  Had mammo/pap 10-2017.   HIV 1 RNA Quant (copies/mL)  Date Value  08/21/2018 28 (H)  02/27/2018 <20 DETECTED (A)  06/11/2017 26 (H)   CD4 T Cell Abs (/uL)  Date Value  08/21/2018 690  02/27/2018 590  06/11/2017 450     Review of Systems  Constitutional: Negative for appetite change, chills, fatigue, fever and unexpected weight change.  Respiratory: Negative for shortness of breath.   Gastrointestinal: Negative for diarrhea and nausea.  Genitourinary: Negative for difficulty urinating and menstrual problem.  Psychiatric/Behavioral: Negative for sleep disturbance.  Please see HPI. All other systems reviewed and negative.      Objective:   Physical Exam Constitutional:      Appearance: Normal appearance.  HENT:     Head: Normocephalic.     Mouth/Throat:     Mouth: Mucous membranes are moist.     Pharynx: No  oropharyngeal exudate.  Eyes:     Extraocular Movements: Extraocular movements intact.     Pupils: Pupils are equal, round, and reactive to light.  Neck:     Musculoskeletal: Normal range of motion.  Cardiovascular:     Rate and Rhythm: Normal rate and regular rhythm.  Pulmonary:     Effort: Pulmonary effort is normal.     Breath sounds: Normal breath sounds.  Abdominal:     General: Abdomen is flat. Bowel sounds are normal. There is no distension.     Palpations: Abdomen is soft.     Tenderness: There is no abdominal tenderness.  Neurological:     Mental Status: She is alert.       Assessment & Plan:

## 2018-09-05 NOTE — Assessment & Plan Note (Signed)
She is doing very well Will continue tivicay-descovy Has gotten flu shot, she defers shingles and pcv 13.  Offered/refused condoms.  Husband is (-). Explained his risk is very low.  rtc in 9 months

## 2018-09-05 NOTE — Assessment & Plan Note (Signed)
Encourage to watch diet and exercise.

## 2018-09-12 DIAGNOSIS — J209 Acute bronchitis, unspecified: Secondary | ICD-10-CM | POA: Diagnosis not present

## 2018-09-17 ENCOUNTER — Ambulatory Visit
Admission: RE | Admit: 2018-09-17 | Discharge: 2018-09-17 | Disposition: A | Discharge status: Home / Self Care | Payer: 59 | Source: Ambulatory Visit | Attending: Family Medicine | Admitting: Family Medicine

## 2018-09-17 ENCOUNTER — Other Ambulatory Visit: Payer: Self-pay | Admitting: Family Medicine

## 2018-09-17 DIAGNOSIS — R059 Cough, unspecified: Secondary | ICD-10-CM

## 2018-09-17 DIAGNOSIS — R05 Cough: Secondary | ICD-10-CM | POA: Diagnosis not present

## 2018-09-25 DIAGNOSIS — E890 Postprocedural hypothyroidism: Secondary | ICD-10-CM | POA: Diagnosis not present

## 2018-10-03 DIAGNOSIS — Z124 Encounter for screening for malignant neoplasm of cervix: Secondary | ICD-10-CM | POA: Diagnosis not present

## 2018-10-03 DIAGNOSIS — Z1231 Encounter for screening mammogram for malignant neoplasm of breast: Secondary | ICD-10-CM | POA: Diagnosis not present

## 2018-10-03 DIAGNOSIS — I10 Essential (primary) hypertension: Secondary | ICD-10-CM | POA: Insufficient documentation

## 2018-10-03 DIAGNOSIS — Z01419 Encounter for gynecological examination (general) (routine) without abnormal findings: Secondary | ICD-10-CM | POA: Diagnosis not present

## 2018-10-21 DIAGNOSIS — Z79899 Other long term (current) drug therapy: Secondary | ICD-10-CM | POA: Diagnosis not present

## 2018-10-21 DIAGNOSIS — M2550 Pain in unspecified joint: Secondary | ICD-10-CM | POA: Diagnosis not present

## 2018-10-21 DIAGNOSIS — M0579 Rheumatoid arthritis with rheumatoid factor of multiple sites without organ or systems involvement: Secondary | ICD-10-CM | POA: Diagnosis not present

## 2018-11-27 DIAGNOSIS — E890 Postprocedural hypothyroidism: Secondary | ICD-10-CM | POA: Diagnosis not present

## 2018-11-29 DIAGNOSIS — Z1211 Encounter for screening for malignant neoplasm of colon: Secondary | ICD-10-CM | POA: Diagnosis not present

## 2018-11-29 DIAGNOSIS — D122 Benign neoplasm of ascending colon: Secondary | ICD-10-CM | POA: Diagnosis not present

## 2018-11-29 DIAGNOSIS — D125 Benign neoplasm of sigmoid colon: Secondary | ICD-10-CM | POA: Diagnosis not present

## 2019-01-31 DIAGNOSIS — E890 Postprocedural hypothyroidism: Secondary | ICD-10-CM | POA: Diagnosis not present

## 2019-02-06 DIAGNOSIS — I10 Essential (primary) hypertension: Secondary | ICD-10-CM | POA: Diagnosis not present

## 2019-02-06 DIAGNOSIS — E890 Postprocedural hypothyroidism: Secondary | ICD-10-CM | POA: Diagnosis not present

## 2019-02-11 ENCOUNTER — Other Ambulatory Visit: Payer: Self-pay | Admitting: Infectious Diseases

## 2019-02-11 DIAGNOSIS — B20 Human immunodeficiency virus [HIV] disease: Secondary | ICD-10-CM

## 2019-02-12 ENCOUNTER — Other Ambulatory Visit: Payer: Self-pay | Admitting: Infectious Diseases

## 2019-02-12 DIAGNOSIS — B20 Human immunodeficiency virus [HIV] disease: Secondary | ICD-10-CM

## 2019-06-10 ENCOUNTER — Other Ambulatory Visit: Payer: Self-pay

## 2019-06-10 ENCOUNTER — Other Ambulatory Visit: Payer: 59

## 2019-06-10 DIAGNOSIS — B20 Human immunodeficiency virus [HIV] disease: Secondary | ICD-10-CM

## 2019-06-10 DIAGNOSIS — Z113 Encounter for screening for infections with a predominantly sexual mode of transmission: Secondary | ICD-10-CM

## 2019-06-10 DIAGNOSIS — Z79899 Other long term (current) drug therapy: Secondary | ICD-10-CM

## 2019-06-12 LAB — HELPER T-LYMPH-CD4 (ARMC ONLY)
% CD 4 Pos. Lymph.: 25.5 % — ABNORMAL LOW (ref 30.8–58.5)
Absolute CD 4 Helper: 663 /uL (ref 359–1519)
Basophils Absolute: 0.1 10*3/uL (ref 0.0–0.2)
Basos: 1 %
EOS (ABSOLUTE): 0.1 10*3/uL (ref 0.0–0.4)
Eos: 1 %
Hematocrit: 40 % (ref 34.0–46.6)
Hemoglobin: 13.7 g/dL (ref 11.1–15.9)
Immature Grans (Abs): 0 10*3/uL (ref 0.0–0.1)
Immature Granulocytes: 0 %
Lymphocytes Absolute: 2.6 10*3/uL (ref 0.7–3.1)
Lymphs: 43 %
MCH: 32.1 pg (ref 26.6–33.0)
MCHC: 34.3 g/dL (ref 31.5–35.7)
MCV: 94 fL (ref 79–97)
Monocytes Absolute: 0.6 10*3/uL (ref 0.1–0.9)
Monocytes: 10 %
Neutrophils Absolute: 2.7 10*3/uL (ref 1.4–7.0)
Neutrophils: 45 %
Platelets: 164 10*3/uL (ref 150–450)
RBC: 4.27 x10E6/uL (ref 3.77–5.28)
RDW: 13.2 % (ref 11.7–15.4)
WBC: 6 10*3/uL (ref 3.4–10.8)

## 2019-06-13 LAB — COMPREHENSIVE METABOLIC PANEL
AG Ratio: 1.5 (calc) (ref 1.0–2.5)
ALT: 18 U/L (ref 6–29)
AST: 20 U/L (ref 10–35)
Albumin: 4.4 g/dL (ref 3.6–5.1)
Alkaline phosphatase (APISO): 138 U/L (ref 37–153)
BUN: 16 mg/dL (ref 7–25)
CO2: 29 mmol/L (ref 20–32)
Calcium: 9.1 mg/dL (ref 8.6–10.4)
Chloride: 101 mmol/L (ref 98–110)
Creat: 0.83 mg/dL (ref 0.50–0.99)
Globulin: 3 g/dL (calc) (ref 1.9–3.7)
Glucose, Bld: 92 mg/dL (ref 65–99)
Potassium: 4 mmol/L (ref 3.5–5.3)
Sodium: 140 mmol/L (ref 135–146)
Total Bilirubin: 0.6 mg/dL (ref 0.2–1.2)
Total Protein: 7.4 g/dL (ref 6.1–8.1)

## 2019-06-13 LAB — LIPID PANEL
Cholesterol: 293 mg/dL — ABNORMAL HIGH (ref <200)
HDL: 113 mg/dL (ref >=50)
LDL Cholesterol (Calc): 161 mg/dL (calc) — ABNORMAL HIGH
Non-HDL Cholesterol (Calc): 180 mg/dL (calc) — ABNORMAL HIGH (ref <130)
Total CHOL/HDL Ratio: 2.6 (calc) (ref <5.0)
Triglycerides: 85 mg/dL (ref <150)

## 2019-06-13 LAB — CBC
HCT: 38.6 % (ref 35.0–45.0)
Hemoglobin: 13.4 g/dL (ref 11.7–15.5)
MCH: 32 pg (ref 27.0–33.0)
MCHC: 34.7 g/dL (ref 32.0–36.0)
MCV: 92.1 fL (ref 80.0–100.0)
MPV: 10.6 fL (ref 7.5–12.5)
Platelets: 165 10*3/uL (ref 140–400)
RBC: 4.19 10*6/uL (ref 3.80–5.10)
RDW: 13.1 % (ref 11.0–15.0)
WBC: 6.1 10*3/uL (ref 3.8–10.8)

## 2019-06-13 LAB — HIV-1 RNA QUANT-NO REFLEX-BLD
HIV 1 RNA Quant: <20 copies/mL
HIV-1 RNA Quant, Log: <1.3 Log copies/mL

## 2019-06-13 LAB — RPR: RPR Ser Ql: NONREACTIVE

## 2019-06-24 ENCOUNTER — Encounter: Payer: 59 | Admitting: Infectious Diseases

## 2019-07-03 ENCOUNTER — Ambulatory Visit: Payer: 59 | Admitting: Infectious Diseases

## 2019-07-03 ENCOUNTER — Other Ambulatory Visit: Payer: Self-pay

## 2019-07-03 VITALS — Wt 220.0 lb

## 2019-07-03 DIAGNOSIS — Z79899 Other long term (current) drug therapy: Secondary | ICD-10-CM

## 2019-07-03 DIAGNOSIS — Z113 Encounter for screening for infections with a predominantly sexual mode of transmission: Secondary | ICD-10-CM

## 2019-07-03 DIAGNOSIS — B20 Human immunodeficiency virus [HIV] disease: Secondary | ICD-10-CM

## 2019-07-03 DIAGNOSIS — M069 Rheumatoid arthritis, unspecified: Secondary | ICD-10-CM

## 2019-07-03 DIAGNOSIS — E0590 Thyrotoxicosis, unspecified without thyrotoxic crisis or storm: Secondary | ICD-10-CM

## 2019-07-03 NOTE — Assessment & Plan Note (Signed)
Appears to be stable. No arthralgias today.

## 2019-07-03 NOTE — Assessment & Plan Note (Signed)
She is doing well Has gotten flu shot.  mammo and pap are up to date Offered/refused condoms.  rtc in 9 months

## 2019-07-03 NOTE — Assessment & Plan Note (Signed)
She is doing well Query if this is cause of wt gain.

## 2019-07-03 NOTE — Progress Notes (Signed)
   Subjective:    Patient ID: Laurali Goddard, female    DOB: December 07, 1955, 63 y.o.   MRN: [id number]  HPI 63yo F who was seen previously in ID for LTBI in 2011 (HIV - then) as she was being screened for embrel (off since 05-2014). She was treated with INH at that time.  She also has hx of RA, elevated lipids, chronic hepatitis (elevated alk phos, MR abd 02-27-15 normal). Has had 2 negative liver Bx per pt.  She was seen by her PCP 6-21-16noted to have new HIV+ (checkedas she had unexplained wt loss ~30# over 6 months). She was also noted to have an oral ulcer and thrush.  Her CD4 was found to be 12. She was seen in ID clinic 6-28, she was started on tivicay/Descovy.  She returned to hospital 7-9-16to 7-15-16with ARDS, IRIS.  She has being rx hyperthyroid. Has gotten XRT. Her thyroxine dose has been stable for 1 year.  Was doing well with descovy-tivicay, deferred changing to biktarvy.  Got Pap and Mammo 10-2018.   HIV 1 RNA Quant (copies/mL)  Date Value  06/10/2019 <20 NOT DETECTED  08/21/2018 28 (H)  02/27/2018 <20 DETECTED (A)   CD4 T Cell Abs (/uL)  Date Value  08/21/2018 690  02/27/2018 590  06/11/2017 450    Review of Systems  Constitutional: Negative for appetite change, chills, fever and unexpected weight change.  Respiratory: Negative for cough and shortness of breath.   Gastrointestinal: Negative for constipation and diarrhea.  Genitourinary: Negative for difficulty urinating.  Musculoskeletal: Negative for arthralgias.  Psychiatric/Behavioral: Negative for sleep disturbance.  Please see HPI. All other systems reviewed and negative.     Objective:   Physical Exam Constitutional:      Appearance: Normal appearance. She is obese.  HENT:     Mouth/Throat:     Mouth: Mucous membranes are moist.     Pharynx: No oropharyngeal exudate.  Eyes:     Extraocular Movements: Extraocular movements intact.     Pupils: Pupils are equal, round, and reactive to light.   Neck:     Musculoskeletal: Normal range of motion and neck supple.  Cardiovascular:     Rate and Rhythm: Normal rate and regular rhythm.  Pulmonary:     Effort: Pulmonary effort is normal.     Breath sounds: Normal breath sounds.  Abdominal:     General: Bowel sounds are normal. There is no distension.     Palpations: Abdomen is soft.     Tenderness: There is no abdominal tenderness.  Musculoskeletal:     Right lower leg: No edema.     Left lower leg: No edema.  Lymphadenopathy:     Cervical: No cervical adenopathy.  Neurological:     Mental Status: She is alert.  Psychiatric:        Mood and Affect: Mood normal.       Assessment & Plan:

## 2019-08-15 ENCOUNTER — Other Ambulatory Visit: Payer: Self-pay | Admitting: Infectious Diseases

## 2019-08-15 DIAGNOSIS — B20 Human immunodeficiency virus [HIV] disease: Secondary | ICD-10-CM

## 2019-08-18 ENCOUNTER — Other Ambulatory Visit: Payer: Self-pay | Admitting: Infectious Diseases

## 2019-08-18 DIAGNOSIS — B20 Human immunodeficiency virus [HIV] disease: Secondary | ICD-10-CM

## 2019-08-18 MED ORDER — TIVICAY 50 MG PO TABS: ORAL_TABLET | ORAL | 5 refills | Status: DC

## 2019-08-18 MED ORDER — DESCOVY 200-25 MG PO TABS: 1.0000 | ORAL_TABLET | Freq: Every day | ORAL | 5 refills | Status: DC

## 2019-09-04 ENCOUNTER — Telehealth: Payer: Self-pay | Admitting: *Deleted

## 2019-09-04 NOTE — Telephone Encounter (Signed)
Patient left voicemail in triage asking for advice. She states her husband was just diagnosed 12/16 with Covid. She would like to know what to do next. When RN returned the call, she had already spoken with Dr Doreene Adas office who will be either testing her for covid, or directing her to the [facility] test site for an appointment (she set one up 12/18 at 11:45).  She does not have symptoms at this time. Landis Gandy, RN

## 2019-09-05 ENCOUNTER — Other Ambulatory Visit: Payer: 59

## 2019-09-06 ENCOUNTER — Telehealth: Payer: Self-pay | Admitting: Unknown Physician Specialty

## 2019-09-06 ENCOUNTER — Other Ambulatory Visit: Payer: Self-pay | Admitting: Unknown Physician Specialty

## 2019-09-06 DIAGNOSIS — U071 COVID-19: Secondary | ICD-10-CM

## 2019-09-06 NOTE — Progress Notes (Signed)
  I connected by phone with Courtney Escobar on 09/06/2019 at 10:34 AM to discuss the potential use of an new treatment for mild to moderate COVID-19 viral infection in non-hospitalized patients.  This patient is a 63 y.o. female that meets the FDA criteria for Emergency Use Authorization of bamlanivimab or casirivimab\imdevimab.  Has a (+) direct SARS-CoV-2 viral test result  Has mild or moderate COVID-19   Is ? 62 years of age and weighs ? 40 kg  Is NOT hospitalized due to COVID-19  Is NOT requiring oxygen therapy or requiring an increase in baseline oxygen flow rate due to COVID-19  Is within 10 days of symptom onset  Has at least one of the high risk factor(s) for progression to severe COVID-19 and/or hospitalization as defined in EUA.  Specific high risk criteria : Diabetes   I have spoken and communicated the following to the patient or parent/caregiver:  1. FDA has authorized the emergency use of bamlanivimab and casirivimab\imdevimab for the treatment of mild to moderate COVID-19 in adults and pediatric patients with positive results of direct SARS-CoV-2 viral testing who are 43 years of age and older weighing at least 40 kg, and who are at high risk for progressing to severe COVID-19 and/or hospitalization.  2. The significant known and potential risks and benefits of bamlanivimab and casirivimab\imdevimab, and the extent to which such potential risks and benefits are unknown.  3. Information on available alternative treatments and the risks and benefits of those alternatives, including clinical trials.  4. Patients treated with bamlanivimab and casirivimab\imdevimab should continue to self-isolate and use infection control measures (e.g., wear mask, isolate, social distance, avoid sharing personal items, clean and disinfect "high touch" surfaces, and frequent handwashing) according to CDC guidelines.   5. The patient or parent/caregiver has the option to accept or refuse  bamlanivimab or casirivimab\imdevimab .  After reviewing this information with the patient, The patient agreed to proceed with receiving the casirivimab\imdevimab infusion and will be provided a copy of the Fact sheet prior to receiving the infusion.Kathrine Haddock 09/06/2019 10:34 AM

## 2019-09-06 NOTE — Telephone Encounter (Signed)
  I connected by phone with Courtney Escobar on 09/06/2019 at 10:23 AM to discuss the potential use of an new treatment for mild to moderate COVID-19 viral infection in non-hospitalized patients.  This patient is a 63 y.o. female that meets the FDA criteria for Emergency Use Authorization of bamlanivimab or casirivimab\imdevimab.  Has a (+) direct SARS-CoV-2 viral test result  Has mild or moderate COVID-19   Is ? 62 years of age and weighs ? 40 kg  Is NOT hospitalized due to COVID-19  Is NOT requiring oxygen therapy or requiring an increase in baseline oxygen flow rate due to COVID-19  Is within 10 days of symptom onset  Has at least one of the high risk factor(s) for progression to severe COVID-19 and/or hospitalization as defined in EUA.  Specific high risk criteria : Diabetes   I have spoken and communicated the following to the patient or parent/caregiver:  1. FDA has authorized the emergency use of bamlanivimab and casirivimab\imdevimab for the treatment of mild to moderate COVID-19 in adults and pediatric patients with positive results of direct SARS-CoV-2 viral testing who are 67 years of age and older weighing at least 40 kg, and who are at high risk for progressing to severe COVID-19 and/or hospitalization.  2. The significant known and potential risks and benefits of bamlanivimab and casirivimab\imdevimab, and the extent to which such potential risks and benefits are unknown.  3. Information on available alternative treatments and the risks and benefits of those alternatives, including clinical trials.  4. Patients treated with bamlanivimab and casirivimab\imdevimab should continue to self-isolate and use infection control measures (e.g., wear mask, isolate, social distance, avoid sharing personal items, clean and disinfect "high touch" surfaces, and frequent handwashing) according to CDC guidelines.   5. The patient or parent/caregiver has the option to accept or refuse  bamlanivimab or casirivimab\imdevimab .  After reviewing this information with the patient, The patient agreed to proceed with receiving the casirivimab\imdevimab infusion and will be provided a copy of the Fact sheet prior to receiving the infusion.Kathrine Haddock 09/06/2019 10:23 AM

## 2019-09-08 ENCOUNTER — Ambulatory Visit (HOSPITAL_COMMUNITY)
Admission: RE | Admit: 2019-09-08 | Discharge: 2019-09-08 | Disposition: A | Discharge status: Home / Self Care | Payer: 59 | Source: Ambulatory Visit | Attending: Pulmonary Disease | Admitting: Pulmonary Disease

## 2019-09-08 DIAGNOSIS — U071 COVID-19: Secondary | ICD-10-CM | POA: Diagnosis not present

## 2019-09-08 MED ORDER — EPINEPHRINE 0.3 MG/0.3ML IJ SOAJ: 0.3000 mg | Freq: Once | INTRAMUSCULAR | Status: DC | PRN

## 2019-09-08 MED ORDER — SODIUM CHLORIDE 0.9 % IV SOLN: INTRAVENOUS | Status: DC | PRN

## 2019-09-08 MED ORDER — FAMOTIDINE IN NACL 20-0.9 MG/50ML-% IV SOLN: 20.0000 mg | Freq: Once | INTRAVENOUS | Status: DC | PRN

## 2019-09-08 MED ORDER — METHYLPREDNISOLONE SODIUM SUCC 125 MG IJ SOLR: 125.0000 mg | Freq: Once | INTRAMUSCULAR | Status: DC | PRN

## 2019-09-08 MED ORDER — ALBUTEROL SULFATE HFA 108 (90 BASE) MCG/ACT IN AERS: 2.0000 | INHALATION_SPRAY | Freq: Once | RESPIRATORY_TRACT | Status: DC | PRN

## 2019-09-08 MED ORDER — DIPHENHYDRAMINE HCL 50 MG/ML IJ SOLN: 50.0000 mg | Freq: Once | INTRAMUSCULAR | Status: DC | PRN

## 2019-09-08 MED ORDER — SODIUM CHLORIDE 0.9 % IV SOLN
Freq: Once | INTRAVENOUS | Status: AC
  Filled 2019-09-08: qty 10

## 2019-09-08 NOTE — Progress Notes (Signed)
  Diagnosis: COVID-19  Physician:  Procedure: [facility] Med: casirivimab\imdevimab infusion - Provided patient with casirivimab\imdevimab fact sheet for patients, parents and caregivers prior to infusion.  Complications: No immediate complications noted.  Discharge: Discharged home   Virgilio Belling 09/08/2019

## 2019-09-08 NOTE — Discharge Instructions (Signed)
Prevent the Spread of COVID-19 if You Are Sick If you are sick with COVID-19 or think you might have COVID-19, follow the steps below to help protect other people in your home and community. Stay home except to get medical care.  Stay home. Most people with COVID-19 have mild illness and are able to recover at home without medical care. Do not leave your home, except to get medical care. Do not visit public areas.  Take care of yourself. Get rest and stay hydrated.  Get medical care when needed. Call your doctor before you go to their office for care. But, if you have trouble breathing or other concerning symptoms, call 911 for immediate help.  Avoid public transportation, ride-sharing, or taxis. Separate yourself from other people and pets in your home.  As much as possible, stay in a specific room and away from other people and pets in your home. Also, you should use a separate bathroom, if available. If you need to be around other people or animals in or outside of the home, wear a cloth face covering. ? See COVID-19 and Animals if you have questions about pets: https://www.cdc.gov/coronavirus/2019-ncov/faq.html#COVID19animals Monitor your symptoms.  Common symptoms of COVID-19 include fever and cough. Trouble breathing is a more serious symptom that means you should get medical attention.  Follow care instructions from your healthcare provider and local health department. Your local health authorities will give instructions on checking your symptoms and reporting information. If you develop emergency warning signs for COVID-19 get medical attention immediately.  Emergency warning signs include*:  Trouble breathing  Persistent pain or pressure in the chest  New confusion or not able to be woken  Bluish lips or face *This list is not all inclusive. Please consult your medical provider for any other symptoms that are severe or concerning to you. Call 911 if you have a medical  emergency. If you have a medical emergency and need to call 911, notify the operator that you have or think you might have, COVID-19. If possible, put on a facemask before medical help arrives. Call ahead before visiting your doctor.  Call ahead. Many medical visits for routine care are being postponed or done by phone or telemedicine.  If you have a medical appointment that cannot be postponed, call your doctor's office. This will help the office protect themselves and other patients. If you are sick, wear a cloth covering over your nose and mouth.  You should wear a cloth face covering over your nose and mouth if you must be around other people or animals, including pets (even at home).  You don't need to wear the cloth face covering if you are alone. If you can't put on a cloth face covering (because of trouble breathing for example), cover your coughs and sneezes in some other way. Try to stay at least 6 feet away from other people. This will help protect the people around you. Note: During the COVID-19 pandemic, medical grade facemasks are reserved for healthcare workers and some first responders. You may need to make a cloth face covering using a scarf or bandana. Cover your coughs and sneezes.  Cover your mouth and nose with a tissue when you cough or sneeze.  Throw used tissues in a lined trash can.  Immediately wash your hands with soap and water for at least 20 seconds. If soap and water are not available, clean your hands with an alcohol-based hand sanitizer that contains at least 60% alcohol. Clean your hands often.    Wash your hands often with soap and water for at least 20 seconds. This is especially important after blowing your nose, coughing, or sneezing; going to the bathroom; and before eating or preparing food.  Use hand sanitizer if soap and water are not available. Use an alcohol-based hand sanitizer with at least 60% alcohol, covering all surfaces of your hands and rubbing  them together until they feel dry.  Soap and water are the best option, especially if your hands are visibly dirty.  Avoid touching your eyes, nose, and mouth with unwashed hands. Avoid sharing personal household items.  Do not share dishes, drinking glasses, cups, eating utensils, towels, or bedding with other people in your home.  Wash these items thoroughly after using them with soap and water or put them in the dishwasher. Clean all "high-touch" surfaces everyday.  Clean and disinfect high-touch surfaces in your "sick room" and bathroom. Let someone else clean and disinfect surfaces in common areas, but not your bedroom and bathroom.  If a caregiver or other person needs to clean and disinfect a sick person's bedroom or bathroom, they should do so on an as-needed basis. The caregiver/other person should wear a mask and wait as long as possible after the sick person has used the bathroom. High-touch surfaces include phones, remote controls, counters, tabletops, doorknobs, bathroom fixtures, toilets, keyboards, tablets, and bedside tables.  Clean and disinfect areas that may have blood, stool, or body fluids on them.  Use household cleaners and disinfectants. Clean the area or item with soap and water or another detergent if it is dirty. Then use a household disinfectant. ? Be sure to follow the instructions on the label to ensure safe and effective use of the product. Many products recommend keeping the surface wet for several minutes to ensure germs are killed. Many also recommend precautions such as wearing gloves and making sure you have good ventilation during use of the product. ? Most EPA-registered household disinfectants should be effective. How to discontinue home isolation  People with COVID-19 who have stayed home (home isolated) can stop home isolation under the following conditions: ? If you will not have a test to determine if you are still contagious, you can leave home  after these three things have happened:  You have had no fever for at least 72 hours (that is three full days of no fever without the use of medicine that reduces fevers) AND  other symptoms have improved (for example, when your cough or shortness of breath has improved) AND  at least 10 days have passed since your symptoms first appeared. ? If you will be tested to determine if you are still contagious, you can leave home after these three things have happened:  You no longer have a fever (without the use of medicine that reduces fevers) AND  other symptoms have improved (for example, when your cough or shortness of breath has improved) AND  you received two negative tests in a row, 24 hours apart. Your doctor will follow CDC guidelines. In all cases, follow the guidance of your healthcare provider and local health department. The decision to stop home isolation should be made in consultation with your healthcare provider and state and local health departments. Local decisions depend on local circumstances. cdc.gov/coronavirus 01/19/2019 This information is not intended to replace advice given to you by your health care provider. Make sure you discuss any questions you have with your health care provider. Document Released: 12/31/2018 Document Revised: 01/29/2019 Document Reviewed: 12/31/2018   Elsevier Patient Education  2020 Elsevier Inc.  

## 2019-09-25 ENCOUNTER — Encounter: Payer: Self-pay | Admitting: Podiatry

## 2019-09-25 ENCOUNTER — Ambulatory Visit: Payer: 59 | Admitting: Podiatry

## 2019-09-25 ENCOUNTER — Ambulatory Visit: Payer: 59

## 2019-09-25 ENCOUNTER — Other Ambulatory Visit: Payer: Self-pay

## 2019-09-25 VITALS — Temp 98.0°F

## 2019-09-25 DIAGNOSIS — M722 Plantar fascial fibromatosis: Secondary | ICD-10-CM

## 2019-09-25 MED ORDER — DICLOFENAC SODIUM 75 MG PO TBEC: 75.0000 mg | DELAYED_RELEASE_TABLET | Freq: Two times a day (BID) | ORAL | 2 refills | Status: AC

## 2019-09-25 NOTE — Patient Instructions (Signed)

## 2019-09-26 NOTE — Progress Notes (Signed)
Subjective:   Patient ID: Courtney Escobar, female   DOB: 64 y.o.   MRN: [id number]   HPI Patient presents stating she is developed a lot of pain in the bottom of her right heel and its been going on now for about 3 months.  Patient does not member specific injury but does have to wear steel toe at work and works on [employer].  Patient does not smoke likes to be active   Review of Systems  All other systems reviewed and are negative.       Objective:  Physical Exam Vitals and nursing note reviewed.  Constitutional:      Appearance: She is well-developed.  Pulmonary:     Effort: Pulmonary effort is normal.  Musculoskeletal:        General: Normal range of motion.  Skin:    General: Skin is warm.  Neurological:     Mental Status: She is alert.     Neurovascular status intact muscle strength was found to be adequate range of motion within normal limits.  Patient is found to have exquisite discomfort plantar fascial right at the insertional point tendon calcaneus with inflammation fluid around the medial band is noted to have good digital perfusion well oriented x3     Assessment:  Acute plantar fasciitis right with inflammation fluid buildup     Plan:  H&P condition reviewed recommended treatment and today I did sterile prep and injected the fascia 3 mg Kenalog 5 mg Xylocaine applied fascial brace and gave instructions on anti-inflammatories and placed on diclofenac 75 mg twice daily.  Instructed on physical therapy support shoes and reappoint to recheck  X-rays indicate small spur no indication of stress fracture arthritis

## 2019-10-09 ENCOUNTER — Ambulatory Visit: Payer: 59 | Admitting: Podiatry

## 2019-10-09 ENCOUNTER — Encounter: Payer: Self-pay | Admitting: Podiatry

## 2019-10-09 ENCOUNTER — Other Ambulatory Visit: Payer: Self-pay

## 2019-10-09 VITALS — Temp 97.0°F

## 2019-10-09 DIAGNOSIS — M722 Plantar fascial fibromatosis: Secondary | ICD-10-CM | POA: Diagnosis not present

## 2019-10-14 NOTE — Progress Notes (Signed)
Subjective:   Patient ID: Courtney Escobar, female   DOB: 64 y.o.   MRN: [id number]   HPI Patient states foot is feeling a lot better with discomfort only upon deep palpation at the time   ROS      Objective:  Physical Exam  Neurovascular status intact with patient's right foot doing well discomfort still present but only upon deep palpation     Assessment:  Acute plantar fasciitis right improved but present     Plan:  H&P advised on continued physical therapy anti-inflammatories and support and patient will be seen back to recheck as needed and hopefully this will be the end of the pathology that the patient is experiencing

## 2019-11-24 ENCOUNTER — Telehealth: Payer: Self-pay | Admitting: *Deleted

## 2019-11-24 NOTE — Telephone Encounter (Signed)
Patient called to let Dr Johnnye Sima know she is planning to get the covid vaccine (likely at [facility]), but knows she has to wait 90 days after her Covid diagnosis.  She was diagnosed in December and received the monoclonal antibody treatment 12/21.  She wanted to make sure she was doing the right thing in waiting the 90 days.  She will let us know when she does get it. Landis Gandy, RN

## 2020-02-12 ENCOUNTER — Other Ambulatory Visit: Payer: Self-pay | Admitting: Infectious Diseases

## 2020-02-12 DIAGNOSIS — B20 Human immunodeficiency virus [HIV] disease: Secondary | ICD-10-CM

## 2020-03-15 ENCOUNTER — Other Ambulatory Visit (HOSPITAL_COMMUNITY)
Admission: RE | Admit: 2020-03-15 | Discharge: 2020-03-15 | Disposition: A | Discharge status: Home / Self Care | Payer: 59 | Source: Ambulatory Visit | Attending: Infectious Diseases | Admitting: Infectious Diseases

## 2020-03-15 ENCOUNTER — Other Ambulatory Visit: Payer: 59

## 2020-03-15 ENCOUNTER — Other Ambulatory Visit: Payer: Self-pay

## 2020-03-15 DIAGNOSIS — Z79899 Other long term (current) drug therapy: Secondary | ICD-10-CM

## 2020-03-15 DIAGNOSIS — Z113 Encounter for screening for infections with a predominantly sexual mode of transmission: Secondary | ICD-10-CM | POA: Diagnosis present

## 2020-03-15 DIAGNOSIS — B20 Human immunodeficiency virus [HIV] disease: Secondary | ICD-10-CM

## 2020-03-16 ENCOUNTER — Other Ambulatory Visit: Payer: Self-pay | Admitting: Infectious Diseases

## 2020-03-16 DIAGNOSIS — B20 Human immunodeficiency virus [HIV] disease: Secondary | ICD-10-CM

## 2020-03-16 LAB — T-HELPER CELL (CD4) - (RCID CLINIC ONLY)
CD4 % Helper T Cell: 26 % — ABNORMAL LOW (ref 33–65)
CD4 T Cell Abs: 613 /uL (ref 400–1790)

## 2020-03-16 LAB — URINE CYTOLOGY ANCILLARY ONLY
Chlamydia: NEGATIVE
Comment: NEGATIVE
Comment: NORMAL
Neisseria Gonorrhea: NEGATIVE

## 2020-03-18 ENCOUNTER — Other Ambulatory Visit: Payer: 59

## 2020-03-19 LAB — COMPREHENSIVE METABOLIC PANEL
AG Ratio: 1.3 (calc) (ref 1.0–2.5)
ALT: 18 U/L (ref 6–29)
AST: 26 U/L (ref 10–35)
Albumin: 4.1 g/dL (ref 3.6–5.1)
Alkaline phosphatase (APISO): 137 U/L (ref 37–153)
BUN: 13 mg/dL (ref 7–25)
CO2: 30 mmol/L (ref 20–32)
Calcium: 9.7 mg/dL (ref 8.6–10.4)
Chloride: 102 mmol/L (ref 98–110)
Creat: 0.82 mg/dL (ref 0.50–0.99)
Globulin: 3.1 g/dL (calc) (ref 1.9–3.7)
Glucose, Bld: 94 mg/dL (ref 65–99)
Potassium: 4.8 mmol/L (ref 3.5–5.3)
Sodium: 140 mmol/L (ref 135–146)
Total Bilirubin: 1 mg/dL (ref 0.2–1.2)
Total Protein: 7.2 g/dL (ref 6.1–8.1)

## 2020-03-19 LAB — LIPID PANEL
Cholesterol: 300 mg/dL — ABNORMAL HIGH (ref <200)
HDL: 103 mg/dL (ref >=50)
LDL Cholesterol (Calc): 175 mg/dL (calc) — ABNORMAL HIGH
Non-HDL Cholesterol (Calc): 197 mg/dL (calc) — ABNORMAL HIGH (ref <130)
Total CHOL/HDL Ratio: 2.9 (calc) (ref <5.0)
Triglycerides: 98 mg/dL (ref <150)

## 2020-03-19 LAB — CBC
HCT: 39.8 % (ref 35.0–45.0)
Hemoglobin: 13.4 g/dL (ref 11.7–15.5)
MCH: 32.3 pg (ref 27.0–33.0)
MCHC: 33.7 g/dL (ref 32.0–36.0)
MCV: 95.9 fL (ref 80.0–100.0)
MPV: 10.8 fL (ref 7.5–12.5)
Platelets: 156 10*3/uL (ref 140–400)
RBC: 4.15 10*6/uL (ref 3.80–5.10)
RDW: 13.1 % (ref 11.0–15.0)
WBC: 5.9 10*3/uL (ref 3.8–10.8)

## 2020-03-19 LAB — HIV-1 RNA QUANT-NO REFLEX-BLD
HIV 1 RNA Quant: <20 copies/mL
HIV-1 RNA Quant, Log: <1.3 Log copies/mL

## 2020-03-19 LAB — RPR: RPR Ser Ql: NONREACTIVE

## 2020-04-01 ENCOUNTER — Other Ambulatory Visit: Payer: Self-pay

## 2020-04-01 ENCOUNTER — Ambulatory Visit: Payer: 59 | Admitting: Infectious Diseases

## 2020-04-01 ENCOUNTER — Encounter: Payer: Self-pay | Admitting: Infectious Diseases

## 2020-04-01 VITALS — BP 136/86 | HR 67 | Temp 98.3°F | Wt 222.0 lb

## 2020-04-01 DIAGNOSIS — Z79899 Other long term (current) drug therapy: Secondary | ICD-10-CM

## 2020-04-01 DIAGNOSIS — E785 Hyperlipidemia, unspecified: Secondary | ICD-10-CM

## 2020-04-01 DIAGNOSIS — B20 Human immunodeficiency virus [HIV] disease: Secondary | ICD-10-CM

## 2020-04-01 DIAGNOSIS — I10 Essential (primary) hypertension: Secondary | ICD-10-CM | POA: Diagnosis not present

## 2020-04-01 DIAGNOSIS — Z113 Encounter for screening for infections with a predominantly sexual mode of transmission: Secondary | ICD-10-CM

## 2020-04-01 NOTE — Assessment & Plan Note (Signed)
Has been asx.  Well controlled today.

## 2020-04-01 NOTE — Assessment & Plan Note (Signed)
Encouraged her to diet and exercise.  Will defer to pcp if she needs statin ASCVD score 9.9% risk over 10 years.

## 2020-04-01 NOTE — Assessment & Plan Note (Signed)
Has condoms from last visit Has gotten COVID vax.  Husband has been tested recently per pt.  suggested [place] but she prefers to keep taking 2 drugs She has some concerns about her insurance changing with turning 66 and continuing her rx. I assured her we can get her help if she needs.  rtc in 9 months.

## 2020-04-01 NOTE — Progress Notes (Signed)
° °  Subjective:    Patient ID: Courtney Escobar, female    DOB: [birth year], 64 y.o.   MRN: [id number]  HPI 64yo F who was seen previously in ID for LTBI in 2011 (HIV - then) as she was being screened for embrel (off since 05-2014). She was treated with INH at that time.  She also has hx of RA, elevated lipids, chronic hepatitis (elevated alk phos, MR abd 02-27-15 normal). Has had 2 negative liver Bx per pt.  She was seen by her PCP 6-21-16noted to have new HIV+ (checkedas she had unexplained wt loss ~30# over 6 months). She was also noted to have an oral ulcer and thrush.  Her CD4 was found to be 12. She was seen in ID clinic 6-28, she was started ontivicay/Descovy.  She returned to hospital 7-9-16to 7-15-16with ARDS, IRIS.  She has rxhyperthyroid. Has gotten XRT.Her thyroxine dose has been stable for 1 year.  Was doing well withdescovy-tivicay, deferred changing to biktarvy.  Got Pap and Mammo 2021 per pt.   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 She got infusion at [facility] and felt like this helped dramatically. Her husband got as well- he still has some breathing issues. She feels except for some fatigue. She has since been vaccinated.   HIV 1 RNA Quant (copies/mL)  Date Value  03/15/2020 <20 NOT DETECTED  06/10/2019 <20 NOT DETECTED  08/21/2018 28 (H)   CD4 T Cell Abs (/uL)  Date Value  03/15/2020 613  08/21/2018 690  02/27/2018 590    Review of Systems  Constitutional: Negative for appetite change and unexpected weight change.  Respiratory: Negative for shortness of breath and stridor.   Gastrointestinal: Negative for constipation and diarrhea.  Genitourinary: Negative for difficulty urinating and menstrual problem.  Psychiatric/Behavioral: Negative for sleep disturbance.       Objective:   Physical Exam Vitals reviewed.  Constitutional:      Appearance: Normal appearance. She is obese.  HENT:     Mouth/Throat:     Mouth: Mucous membranes are moist.      Pharynx: No oropharyngeal exudate.  Eyes:     Extraocular Movements: Extraocular movements intact.     Pupils: Pupils are equal, round, and reactive to light.  Cardiovascular:     Rate and Rhythm: Normal rate and regular rhythm.  Pulmonary:     Effort: Pulmonary effort is normal.     Breath sounds: Normal breath sounds.  Abdominal:     General: Bowel sounds are normal. There is no distension.     Palpations: Abdomen is soft.     Tenderness: There is no abdominal tenderness.  Musculoskeletal:     Cervical back: Normal range of motion and neck supple.     Right lower leg: No edema.     Left lower leg: No edema.  Neurological:     General: No focal deficit present.     Mental Status: She is alert.  Psychiatric:        Mood and Affect: Mood normal.           Assessment & Plan:

## 2020-04-16 ENCOUNTER — Other Ambulatory Visit: Payer: Self-pay | Admitting: Infectious Diseases

## 2020-04-16 DIAGNOSIS — B20 Human immunodeficiency virus [HIV] disease: Secondary | ICD-10-CM

## 2020-07-15 ENCOUNTER — Other Ambulatory Visit: Payer: Self-pay | Admitting: Infectious Diseases

## 2020-07-15 DIAGNOSIS — B20 Human immunodeficiency virus [HIV] disease: Secondary | ICD-10-CM

## 2020-09-13 IMAGING — DX DG CHEST 2V
2 series · 2 of 2 positions shown · non-contrast
Comparison: Radiographs September 15, 2016.

CLINICAL DATA: Cough.

EXAM:
CHEST - 2 VIEW

[dg chest 2 view (1 of 2)]
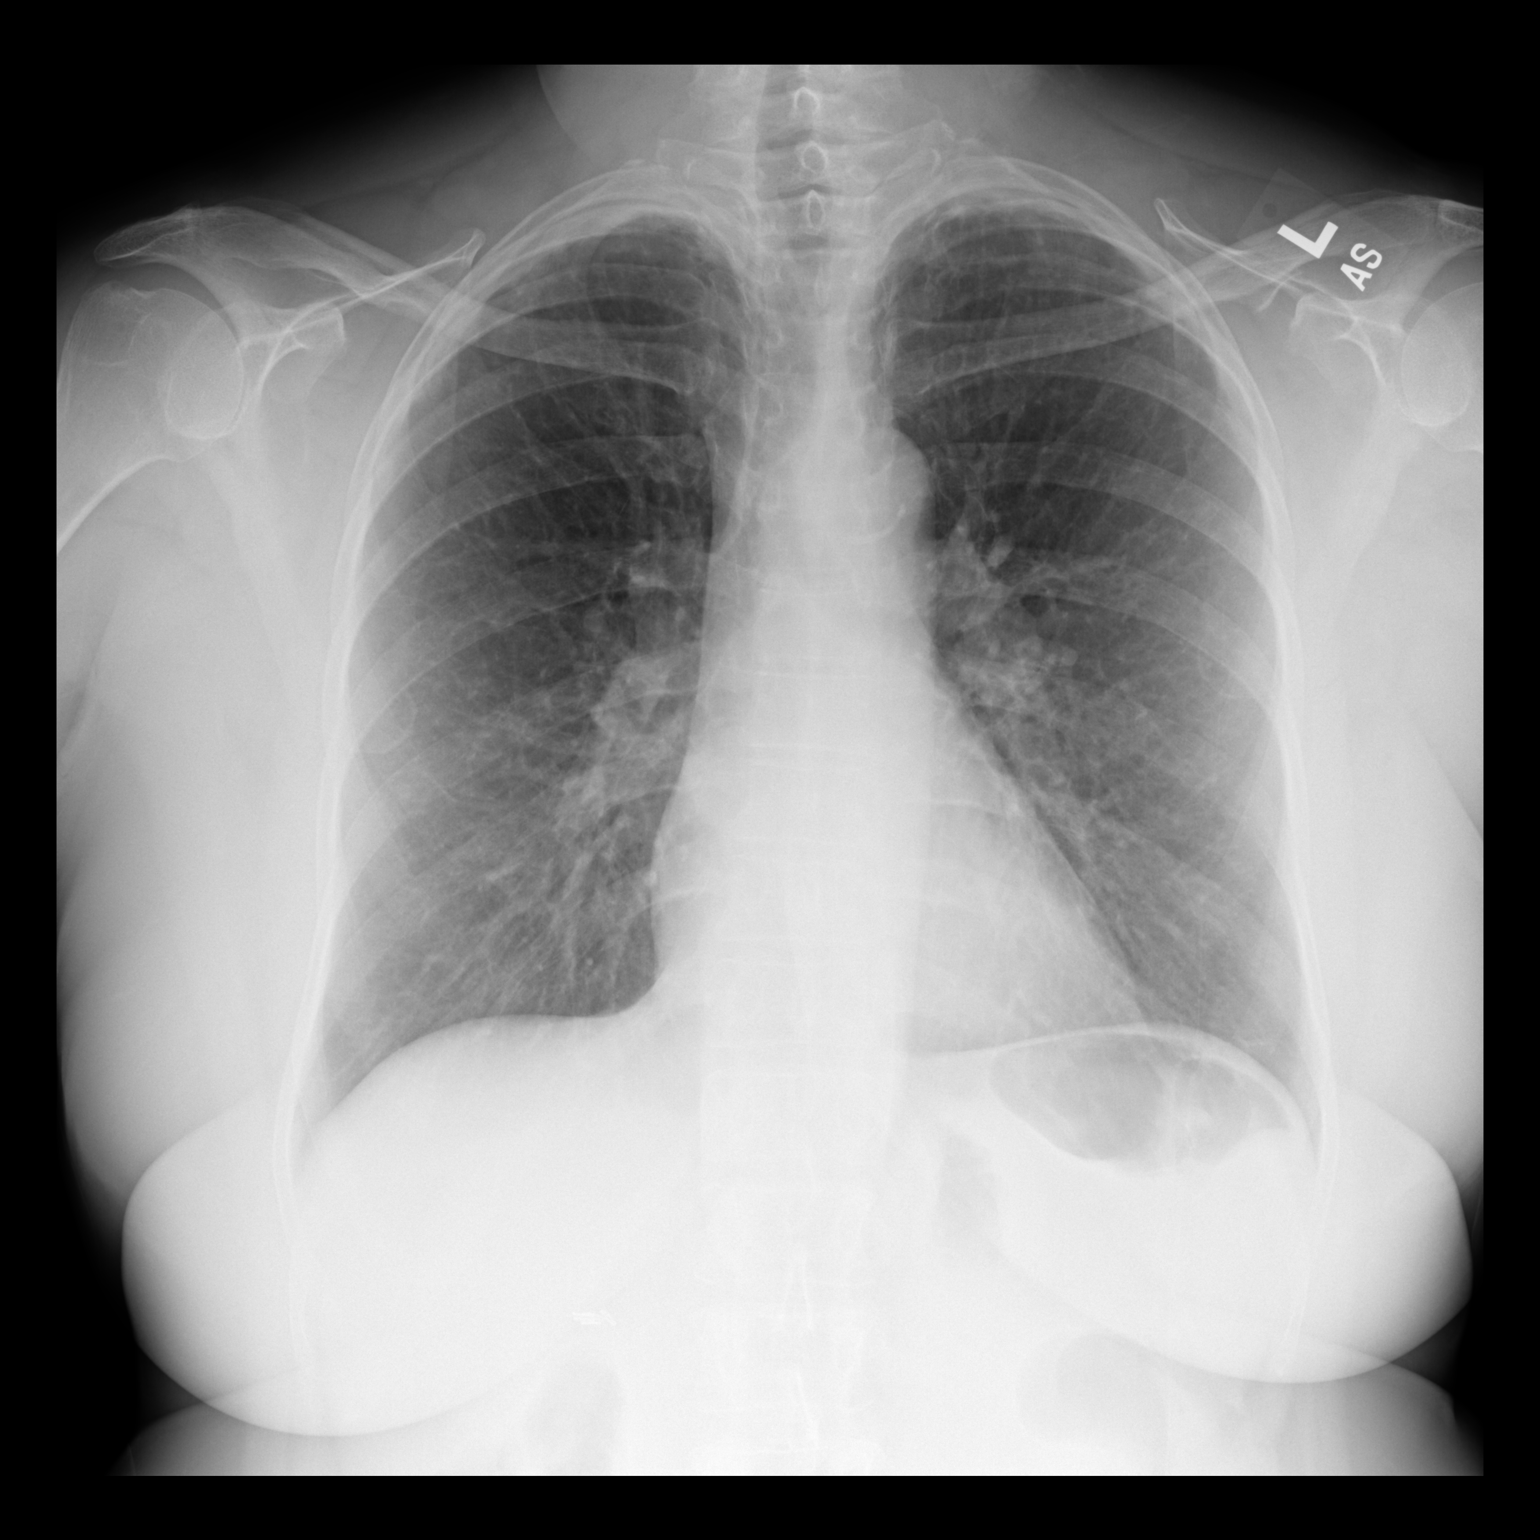

[dg chest 2 view (2 of 2)]
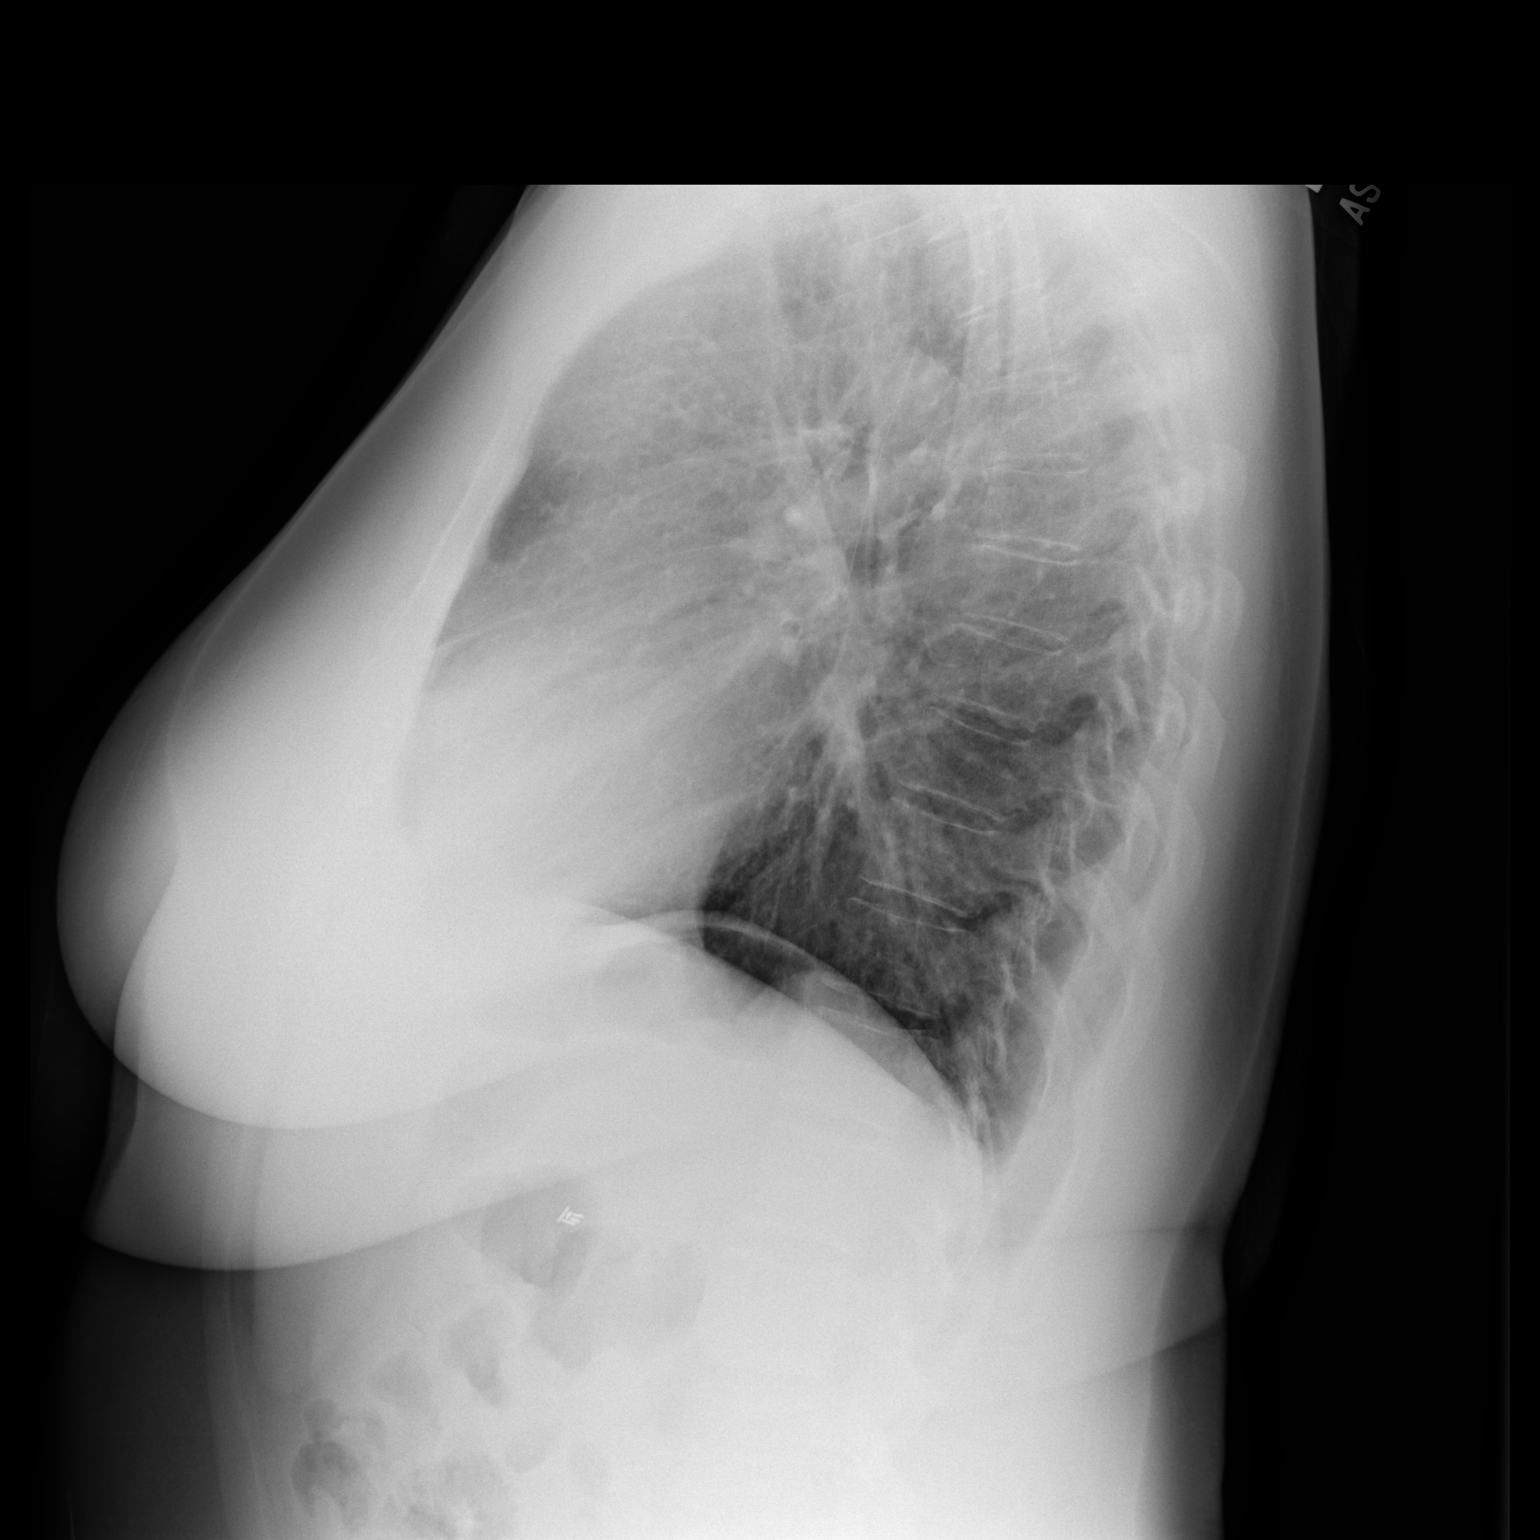

[2 of 2 positions shown; findings below may reference images not displayed]

FINDINGS: The heart size and mediastinal contours are within normal limits.
Both lungs are clear. The visualized skeletal structures are
unremarkable.
IMPRESSION: No active cardiopulmonary disease.

## 2020-09-30 ENCOUNTER — Telehealth: Payer: Self-pay

## 2020-09-30 NOTE — Telephone Encounter (Signed)
Received call from patient stating she developed some congestion on Saturday. She completed a COVID PCR test at [facility] on Monday, and a rapid test at the [facility] on Tuesday. She has yet to hear anything back. RN advised patient that we do not have access to her results, advised her to call [facility] again this afternoon to see if her test has resulted and to try calling [facility] from the [facility] for rapid test results.   Patient interested in infusions if she is positive. RN relayed that infusion supply is extremely limited right now, but told her she can call us back if she does end up positive and would like to discuss a referral.   Beryle Flock, RN

## 2020-12-21 ENCOUNTER — Other Ambulatory Visit: Payer: Self-pay

## 2020-12-21 ENCOUNTER — Other Ambulatory Visit (HOSPITAL_COMMUNITY)
Admission: RE | Admit: 2020-12-21 | Discharge: 2020-12-21 | Disposition: A | Discharge status: Home / Self Care | Payer: 59 | Source: Ambulatory Visit | Attending: Infectious Diseases | Admitting: Infectious Diseases

## 2020-12-21 ENCOUNTER — Other Ambulatory Visit: Payer: 59

## 2020-12-21 DIAGNOSIS — Z113 Encounter for screening for infections with a predominantly sexual mode of transmission: Secondary | ICD-10-CM

## 2020-12-21 DIAGNOSIS — B20 Human immunodeficiency virus [HIV] disease: Secondary | ICD-10-CM

## 2020-12-21 DIAGNOSIS — Z79899 Other long term (current) drug therapy: Secondary | ICD-10-CM

## 2020-12-22 LAB — URINE CYTOLOGY ANCILLARY ONLY
Chlamydia: NEGATIVE
Comment: NEGATIVE
Comment: NORMAL
Neisseria Gonorrhea: NEGATIVE

## 2020-12-22 LAB — T-HELPER CELL (CD4) - (RCID CLINIC ONLY)
CD4 % Helper T Cell: 26 % — ABNORMAL LOW (ref 33–65)
CD4 T Cell Abs: 587 /uL (ref 400–1790)

## 2020-12-23 LAB — COMPREHENSIVE METABOLIC PANEL
AG Ratio: 1.4 (calc) (ref 1.0–2.5)
ALT: 17 U/L (ref 6–29)
AST: 23 U/L (ref 10–35)
Albumin: 4.2 g/dL (ref 3.6–5.1)
Alkaline phosphatase (APISO): 139 U/L (ref 37–153)
BUN/Creatinine Ratio: 17 (calc) (ref 6–22)
BUN: 18 mg/dL (ref 7–25)
CO2: 28 mmol/L (ref 20–32)
Calcium: 9.3 mg/dL (ref 8.6–10.4)
Chloride: 103 mmol/L (ref 98–110)
Creat: 1.03 mg/dL — ABNORMAL HIGH (ref 0.50–0.99)
Globulin: 2.9 g/dL (calc) (ref 1.9–3.7)
Glucose, Bld: 82 mg/dL (ref 65–99)
Potassium: 4.3 mmol/L (ref 3.5–5.3)
Sodium: 140 mmol/L (ref 135–146)
Total Bilirubin: 0.8 mg/dL (ref 0.2–1.2)
Total Protein: 7.1 g/dL (ref 6.1–8.1)

## 2020-12-23 LAB — CBC
HCT: 40.3 % (ref 35.0–45.0)
Hemoglobin: 13.5 g/dL (ref 11.7–15.5)
MCH: 32.1 pg (ref 27.0–33.0)
MCHC: 33.5 g/dL (ref 32.0–36.0)
MCV: 95.7 fL (ref 80.0–100.0)
MPV: 10.5 fL (ref 7.5–12.5)
Platelets: 163 10*3/uL (ref 140–400)
RBC: 4.21 10*6/uL (ref 3.80–5.10)
RDW: 12.7 % (ref 11.0–15.0)
WBC: 6.6 10*3/uL (ref 3.8–10.8)

## 2020-12-23 LAB — LIPID PANEL
Cholesterol: 325 mg/dL — ABNORMAL HIGH (ref <200)
HDL: 114 mg/dL (ref >=50)
LDL Cholesterol (Calc): 187 mg/dL (calc) — ABNORMAL HIGH
Non-HDL Cholesterol (Calc): 211 mg/dL (calc) — ABNORMAL HIGH (ref <130)
Total CHOL/HDL Ratio: 2.9 (calc) (ref <5.0)
Triglycerides: 117 mg/dL (ref <150)

## 2020-12-23 LAB — RPR: RPR Ser Ql: NONREACTIVE

## 2020-12-23 LAB — HIV-1 RNA QUANT-NO REFLEX-BLD
HIV 1 RNA Quant: NOT DETECTED Copies/mL
HIV-1 RNA Quant, Log: NOT DETECTED Log cps/mL

## 2021-01-04 ENCOUNTER — Encounter: Payer: 59 | Admitting: Infectious Diseases

## 2021-01-11 ENCOUNTER — Ambulatory Visit: Payer: 59 | Admitting: Infectious Diseases

## 2021-01-11 ENCOUNTER — Other Ambulatory Visit: Payer: Self-pay

## 2021-01-11 ENCOUNTER — Encounter: Payer: Self-pay | Admitting: Infectious Diseases

## 2021-01-11 VITALS — BP 134/85 | HR 95 | Temp 98.5°F | Wt 227.0 lb

## 2021-01-11 DIAGNOSIS — B20 Human immunodeficiency virus [HIV] disease: Secondary | ICD-10-CM

## 2021-01-11 DIAGNOSIS — Z113 Encounter for screening for infections with a predominantly sexual mode of transmission: Secondary | ICD-10-CM | POA: Diagnosis not present

## 2021-01-11 DIAGNOSIS — E669 Obesity, unspecified: Secondary | ICD-10-CM

## 2021-01-11 DIAGNOSIS — B1910 Unspecified viral hepatitis B without hepatic coma: Secondary | ICD-10-CM | POA: Diagnosis not present

## 2021-01-11 DIAGNOSIS — I10 Essential (primary) hypertension: Secondary | ICD-10-CM

## 2021-01-11 DIAGNOSIS — E785 Hyperlipidemia, unspecified: Secondary | ICD-10-CM

## 2021-01-11 DIAGNOSIS — Z79899 Other long term (current) drug therapy: Secondary | ICD-10-CM

## 2021-01-11 NOTE — Assessment & Plan Note (Addendum)
encouraged diet and exercise. Difficult with thyroid and HIV/ART.

## 2021-01-11 NOTE — Assessment & Plan Note (Addendum)
She is doing well Continue on descovy/tivicay.  husband is HIV-, gets tested regularly. Has interest in PREP. Asked her to set up appt for him.  Offered/refused condoms. Got when she came for labs. Explained U=U.  Labs reviewed with her Offered her PCV13, she defers.  rtc in 12 months with labs.

## 2021-01-11 NOTE — Assessment & Plan Note (Signed)
Will defer to PCP She is out of age range for ASCVD calculator (and total Chol and HDL too high).

## 2021-01-11 NOTE — Assessment & Plan Note (Signed)
Hep B antibodies  positive. Will recheck and check RNA.

## 2021-01-11 NOTE — Progress Notes (Signed)
Subjective:    Patient ID: Courtney Escobar, female  DOB: 1956-02-22, 65 y.o.        MRN: [id number]   HPI 65yo F who was seen previously in ID for LTBI in 2011 (HIV - then) as she was being screened for embrel (off since 05-2014). She was treated with INH at that time.  She also has hx of RA, elevated lipids, chronic hepatitis (elevated alk phos, MR abd 02-27-15 normal). Has had 2 negative liver Bx per pt.  She was seen by her PCP 6-21-16noted to have new HIV+ (checkedas she had unexplained wt loss ~30# over 6 months). She was also noted to have an oral ulcer and thrush.  Her CD4 was found to be 12. She was seen in ID clinic 03-16-15, she was started ontivicay/Descovy.  She returned to hospital 7-9-16to 7-15-16with ARDS, IRIS.  She has rxhyperthyroid. Has gotten XRT.has endo f/u May 2022, dose has been stable.  Was doing well withdescovy-tivicay, deferred changing to [place]. Pap (10-2020 at Dr Harrington Challenger office, nl per pt) and Mammo (10-2020) Has gotten COVID twice (Jan 2022 and 2020).  Has returned to baseline. (no sob or cough). Working.    HIV 1 RNA Quant  Date Value  12/21/2020 Not Detected Copies/mL  03/15/2020 <20 NOT DETECTED copies/mL  06/10/2019 <20 NOT DETECTED copies/mL   CD4 T Cell Abs (/uL)  Date Value  12/21/2020 587  03/15/2020 613  08/21/2018 690     Health Maintenance  Topic Date Due  . FOOT EXAM  Never done  . OPHTHALMOLOGY EXAM  Never done  . URINE MICROALBUMIN  Never done  . TETANUS/TDAP  01/28/2013  . HEMOGLOBIN A1C  09/15/2015  . PAP SMEAR-Modifier  08/03/2019  . COVID-19 Vaccine (3 - Pfizer risk 4-dose series) 01/30/2020  . MAMMOGRAM  10/21/2020  . INFLUENZA VACCINE  04/18/2021  . COLONOSCOPY (Pts 45-73yr Insurance coverage will need to be confirmed)  11/17/2028  . PNEUMOCOCCAL POLYSACCHARIDE VACCINE AGE 13-64 HIGH RISK  Completed  . Hepatitis C Screening  Completed  . HIV Screening  Completed  . HPV VACCINES  Aged Out    Review of Systems   Constitutional: Negative for chills, fever and weight loss.  Respiratory: Negative for cough and shortness of breath.   Gastrointestinal: Negative for blood in stool and constipation.  Genitourinary: Negative for dysuria.  Psychiatric/Behavioral: The patient does not have insomnia.    No change in wt, not exercising.  Please see HPI. All other systems reviewed and negative.     Objective:  Physical Exam Vitals reviewed.  Constitutional:      Appearance: She is obese.  HENT:     Mouth/Throat:     Mouth: Mucous membranes are moist.     Pharynx: No oropharyngeal exudate.  Eyes:     Extraocular Movements: Extraocular movements intact.     Pupils: Pupils are equal, round, and reactive to light.  Cardiovascular:     Rate and Rhythm: Normal rate and regular rhythm.  Pulmonary:     Effort: Pulmonary effort is normal.     Breath sounds: Normal breath sounds.  Abdominal:     General: Bowel sounds are normal. There is no distension.     Palpations: Abdomen is soft.     Tenderness: There is no abdominal tenderness.  Musculoskeletal:        General: Normal range of motion.     Cervical back: Normal range of motion and neck supple.     Right lower leg: No  edema.     Left lower leg: No edema.  Neurological:     General: No focal deficit present.     Mental Status: She is alert.  Psychiatric:        Mood and Affect: Mood normal.            Assessment & Plan:

## 2021-01-11 NOTE — Assessment & Plan Note (Signed)
Appreciate her PCP, continues on lopressor.

## 2021-02-06 ENCOUNTER — Other Ambulatory Visit: Payer: Self-pay | Admitting: Infectious Diseases

## 2021-02-06 DIAGNOSIS — B20 Human immunodeficiency virus [HIV] disease: Secondary | ICD-10-CM

## 2021-02-07 ENCOUNTER — Other Ambulatory Visit: Payer: Self-pay

## 2021-02-07 DIAGNOSIS — B20 Human immunodeficiency virus [HIV] disease: Secondary | ICD-10-CM

## 2021-02-07 MED ORDER — TIVICAY 50 MG PO TABS: ORAL_TABLET | ORAL | 5 refills | Status: DC

## 2021-02-07 MED ORDER — DESCOVY 200-25 MG PO TABS: 1.0000 | ORAL_TABLET | Freq: Every day | ORAL | 5 refills | Status: DC

## 2021-03-02 ENCOUNTER — Other Ambulatory Visit: Payer: Self-pay

## 2021-03-02 ENCOUNTER — Ambulatory Visit: Payer: 59 | Admitting: Podiatry

## 2021-03-02 ENCOUNTER — Encounter: Payer: Self-pay | Admitting: Podiatry

## 2021-03-02 DIAGNOSIS — B070 Plantar wart: Secondary | ICD-10-CM | POA: Diagnosis not present

## 2021-03-04 NOTE — Progress Notes (Signed)
Subjective:   Patient ID: Courtney Escobar, female   DOB: 65 y.o.   MRN: [id number]   HPI Patient states she has developed a painful lesion on the bottom of the left foot and it has occurred over the last 6 months.  Patient states she is tried over-the-counter medicine without relief   ROS      Objective:  Physical Exam  Neurovascular status intact with keratotic lesions of third metatarsal left measuring approximate 7 x 7 mm upon debridement shows pinpoint bleeding pain to lateral pressure with no other issues noted     Assessment:  Probable verruca plantaris plantar aspect left foot pain     Plan:  H&P reviewed condition debridement of lesion accomplished sharp dissection and apply chemical agent to create immune response with sterile dressing and instructed on padding therapy.  Reappoint as symptoms indicate

## 2021-08-05 ENCOUNTER — Other Ambulatory Visit: Payer: Self-pay | Admitting: Infectious Diseases

## 2021-08-05 DIAGNOSIS — B20 Human immunodeficiency virus [HIV] disease: Secondary | ICD-10-CM

## 2021-12-22 ENCOUNTER — Other Ambulatory Visit (HOSPITAL_COMMUNITY)
Admission: RE | Admit: 2021-12-22 | Discharge: 2021-12-22 | Disposition: A | Discharge status: Home / Self Care | Payer: 59 | Source: Ambulatory Visit | Attending: Infectious Diseases | Admitting: Infectious Diseases

## 2021-12-22 ENCOUNTER — Other Ambulatory Visit: Payer: 59

## 2021-12-22 ENCOUNTER — Other Ambulatory Visit: Payer: Self-pay

## 2021-12-22 DIAGNOSIS — Z79899 Other long term (current) drug therapy: Secondary | ICD-10-CM

## 2021-12-22 DIAGNOSIS — Z113 Encounter for screening for infections with a predominantly sexual mode of transmission: Secondary | ICD-10-CM | POA: Diagnosis present

## 2021-12-22 DIAGNOSIS — E669 Obesity, unspecified: Secondary | ICD-10-CM

## 2021-12-22 DIAGNOSIS — E785 Hyperlipidemia, unspecified: Secondary | ICD-10-CM

## 2021-12-22 DIAGNOSIS — B1910 Unspecified viral hepatitis B without hepatic coma: Secondary | ICD-10-CM

## 2021-12-22 DIAGNOSIS — B20 Human immunodeficiency virus [HIV] disease: Secondary | ICD-10-CM

## 2021-12-23 LAB — T-HELPER CELL (CD4) - (RCID CLINIC ONLY)
CD4 % Helper T Cell: 27 % — ABNORMAL LOW (ref 33–65)
CD4 T Cell Abs: 532 /uL (ref 400–1790)

## 2021-12-23 LAB — URINE CYTOLOGY ANCILLARY ONLY
Chlamydia: NEGATIVE
Comment: NEGATIVE
Comment: NORMAL
Neisseria Gonorrhea: NEGATIVE

## 2021-12-25 LAB — COMPREHENSIVE METABOLIC PANEL
AG Ratio: 1.3 (calc) (ref 1.0–2.5)
ALT: 17 U/L (ref 6–29)
AST: 27 U/L (ref 10–35)
Albumin: 3.9 g/dL (ref 3.6–5.1)
Alkaline phosphatase (APISO): 146 U/L (ref 37–153)
BUN: 13 mg/dL (ref 7–25)
CO2: 29 mmol/L (ref 20–32)
Calcium: 9.1 mg/dL (ref 8.6–10.4)
Chloride: 104 mmol/L (ref 98–110)
Creat: 0.94 mg/dL (ref 0.50–1.05)
Globulin: 3.1 g/dL (calc) (ref 1.9–3.7)
Glucose, Bld: 78 mg/dL (ref 65–99)
Potassium: 4.7 mmol/L (ref 3.5–5.3)
Sodium: 139 mmol/L (ref 135–146)
Total Bilirubin: 0.8 mg/dL (ref 0.2–1.2)
Total Protein: 7 g/dL (ref 6.1–8.1)

## 2021-12-25 LAB — CBC
HCT: 40.6 % (ref 35.0–45.0)
Hemoglobin: 13.7 g/dL (ref 11.7–15.5)
MCH: 32.5 pg (ref 27.0–33.0)
MCHC: 33.7 g/dL (ref 32.0–36.0)
MCV: 96.2 fL (ref 80.0–100.0)
MPV: 10.9 fL (ref 7.5–12.5)
Platelets: 136 10*3/uL — ABNORMAL LOW (ref 140–400)
RBC: 4.22 10*6/uL (ref 3.80–5.10)
RDW: 12.6 % (ref 11.0–15.0)
WBC: 5.6 10*3/uL (ref 3.8–10.8)

## 2021-12-25 LAB — HEPATITIS B SURFACE ANTIBODY,QUALITATIVE: Hep B S Ab: REACTIVE — AB

## 2021-12-25 LAB — HEPATITIS B DNA, ULTRAQUANTITATIVE, PCR
Hepatitis B DNA (Calc): <1 Log IU/mL
Hepatitis B DNA: <10 IU/mL

## 2021-12-25 LAB — HEPATITIS B CORE ANTIBODY, TOTAL: Hep B Core Total Ab: REACTIVE — AB

## 2021-12-25 LAB — HIV-1 RNA QUANT-NO REFLEX-BLD
HIV 1 RNA Quant: NOT DETECTED copies/mL
HIV-1 RNA Quant, Log: NOT DETECTED Log copies/mL

## 2021-12-25 LAB — HEPATITIS B SURFACE ANTIGEN: Hepatitis B Surface Ag: NONREACTIVE

## 2021-12-25 LAB — RPR: RPR Ser Ql: NONREACTIVE

## 2021-12-25 LAB — LIPID PANEL
Cholesterol: 234 mg/dL — ABNORMAL HIGH (ref <200)
HDL: 95 mg/dL (ref >=50)
LDL Cholesterol (Calc): 120 mg/dL (calc) — ABNORMAL HIGH
Non-HDL Cholesterol (Calc): 139 mg/dL (calc) — ABNORMAL HIGH (ref <130)
Total CHOL/HDL Ratio: 2.5 (calc) (ref <5.0)
Triglycerides: 93 mg/dL (ref <150)

## 2021-12-25 LAB — HEPATITIS B E ANTIGEN: Hep B E Ag: NONREACTIVE

## 2022-01-05 ENCOUNTER — Encounter: Payer: Self-pay | Admitting: Infectious Diseases

## 2022-01-05 ENCOUNTER — Other Ambulatory Visit: Payer: Self-pay

## 2022-01-05 ENCOUNTER — Ambulatory Visit: Payer: 59 | Admitting: Infectious Diseases

## 2022-01-05 VITALS — BP 142/84 | HR 66 | Temp 98.1°F | Wt 224.0 lb

## 2022-01-05 DIAGNOSIS — B1910 Unspecified viral hepatitis B without hepatic coma: Secondary | ICD-10-CM | POA: Diagnosis not present

## 2022-01-05 DIAGNOSIS — E0590 Thyrotoxicosis, unspecified without thyrotoxic crisis or storm: Secondary | ICD-10-CM

## 2022-01-05 DIAGNOSIS — Z79899 Other long term (current) drug therapy: Secondary | ICD-10-CM | POA: Diagnosis not present

## 2022-01-05 DIAGNOSIS — B20 Human immunodeficiency virus [HIV] disease: Secondary | ICD-10-CM

## 2022-01-05 DIAGNOSIS — Z113 Encounter for screening for infections with a predominantly sexual mode of transmission: Secondary | ICD-10-CM

## 2022-01-05 MED ORDER — BIKTARVY 50-200-25 MG PO TABS: 1.0000 | ORAL_TABLET | Freq: Every day | ORAL | 3 refills | Status: DC

## 2022-01-05 NOTE — Assessment & Plan Note (Signed)
She is doing well ?She consents to changing her rx to [place].  ?Will see her back in 6 months to re-eval her tolerance of this. ?I expalined her labs to her ?She has GYN/mammo f/u next month.  ?Offered/refused condoms.  ?U=U explained.  ?

## 2022-01-05 NOTE — Progress Notes (Signed)
? ?Subjective:  ? ? Patient ID: Courtney Escobar, female  DOB: Mar 13, 1956, 66 y.o.        MRN: [id number] ? ? ?HPI ?66 yo F who was seen previously in ID for LTBI in 2011 (HIV - then) as she was being screened for embrel (off since 05-2014).  She was treated with INH at that time.  ?She also has hx of RA, elevated lipids, chronic hepatitis (elevated alk phos, MR abd 02-27-15 normal). Has had 2 negative liver Bx per pt.   ?She was seen by her PCP 03-09-15 noted to have new HIV+ (checked as she had unexplained wt loss ~30# over 6 months). She was also noted to have an oral ulcer and thrush.    ?Her CD4 was found to be 12. She was seen in ID clinic 03-16-15, she was started on tivicay/Descovy.   ?She returned to hospital 03-27-15 to 04-02-15 with ARDS, IRIS. ?  ?She has rx hyperthyroid. Has gotten XRT. has endo f/u, dose has been stable.  ?Was doing well with descovy-tivicay, deferred changing to [place].  ? ?She was started on flonase recently due to seasonal allergies. ?C/o bad taste from this.   ? ?Pap (01-31-2022 at Dr Harrington Challenger office, prev nl per pt) and Mammo (01-2022) ?Has gotten COVID twice (Jan 2022 and 2020).  ? ?HIV 1 RNA Quant  ?Date Value  ?12/22/2021 NOT DETECTED copies/mL  ?12/21/2020 Not Detected Copies/mL  ?03/15/2020 <20 NOT DETECTED copies/mL  ? ?CD4 T Cell Abs (/uL)  ?Date Value  ?12/22/2021 532  ?12/21/2020 587  ?03/15/2020 613  ? ? ? ?Health Maintenance  ?Topic Date Due  ?? FOOT EXAM  Never done  ?? OPHTHALMOLOGY EXAM  Never done  ?? URINE MICROALBUMIN  Never done  ?? Zoster Vaccines- Shingrix (1 of 2) Never done  ?? TETANUS/TDAP  01/28/2013  ?? HEMOGLOBIN A1C  09/15/2015  ?? PAP SMEAR-Modifier  08/03/2019  ?? COVID-19 Vaccine (3 - Pfizer risk series) 01/30/2020  ?? MAMMOGRAM  10/21/2020  ?? INFLUENZA VACCINE  04/18/2022  ?? COLONOSCOPY (Pts 45-45yr Insurance coverage will need to be confirmed)  11/17/2028  ?? Hepatitis C Screening  Completed  ?? HIV Screening  Completed  ?? HPV VACCINES  Aged Out  ? ? ? ? ?Review  of Systems  ?Constitutional:  Negative for chills, fever and weight loss.  ?HENT:  Positive for sinus pain.   ?Respiratory:  Positive for cough and sputum production (clear- yellow).   ?Gastrointestinal:  Negative for constipation and diarrhea.  ?Genitourinary:  Negative for dysuria.  ?Neurological:  Negative for headaches.  ? ?Please see HPI. All other systems reviewed and negative. ? ?   ?Objective:  ?Physical Exam ?Vitals reviewed.  ?Constitutional:   ?   Appearance: Normal appearance. She is obese.  ?HENT:  ?   Mouth/Throat:  ?   Mouth: Mucous membranes are moist.  ?   Pharynx: No oropharyngeal exudate.  ?Eyes:  ?   Extraocular Movements: Extraocular movements intact.  ?   Pupils: Pupils are equal, round, and reactive to light.  ?Cardiovascular:  ?   Rate and Rhythm: Normal rate and regular rhythm.  ?Pulmonary:  ?   Effort: Pulmonary effort is normal.  ?   Breath sounds: Normal breath sounds.  ?Abdominal:  ?   General: Bowel sounds are normal. There is no distension.  ?   Palpations: Abdomen is soft.  ?   Tenderness: There is no abdominal tenderness.  ?Musculoskeletal:     ?   General:  Normal range of motion.  ?   Cervical back: Normal range of motion and neck supple.  ?   Right lower leg: No edema.  ?   Left lower leg: No edema.  ?Neurological:  ?   General: No focal deficit present.  ?   Mental Status: She is alert.  ? ? ? ? ? ?   ?Assessment & Plan:  ? ?

## 2022-01-05 NOTE — Assessment & Plan Note (Signed)
Has endo f/u next week.  ?Appears to be doing well.  ?

## 2022-01-05 NOTE — Assessment & Plan Note (Signed)
Hep B S Ab and Core+, DNA-.  ?She is immune.  ?Will mark as resolved.  ?

## 2022-03-08 ENCOUNTER — Encounter: Payer: Self-pay | Admitting: Infectious Diseases

## 2022-06-27 ENCOUNTER — Other Ambulatory Visit: Payer: 59

## 2022-06-27 ENCOUNTER — Other Ambulatory Visit (HOSPITAL_COMMUNITY)
Admission: RE | Admit: 2022-06-27 | Discharge: 2022-06-27 | Disposition: A | Discharge status: Home / Self Care | Payer: 59 | Source: Ambulatory Visit | Attending: Infectious Diseases | Admitting: Infectious Diseases

## 2022-06-27 DIAGNOSIS — Z113 Encounter for screening for infections with a predominantly sexual mode of transmission: Secondary | ICD-10-CM | POA: Diagnosis present

## 2022-06-27 DIAGNOSIS — B20 Human immunodeficiency virus [HIV] disease: Secondary | ICD-10-CM

## 2022-06-27 DIAGNOSIS — Z79899 Other long term (current) drug therapy: Secondary | ICD-10-CM

## 2022-06-27 NOTE — Addendum Note (Signed)
Addended by: Truddie Crumble on: 06/27/2022 02:35 PM   Modules accepted: Orders

## 2022-06-28 LAB — LIPID PANEL
Chol/HDL Ratio: 2 ratio (ref 0.0–4.4)
Cholesterol, Total: 219 mg/dL — ABNORMAL HIGH (ref 100–199)
HDL: 110 mg/dL (ref >39)
LDL Chol Calc (NIH): 96 mg/dL (ref 0–99)
Triglycerides: 75 mg/dL (ref 0–149)
VLDL Cholesterol Cal: 13 mg/dL (ref 5–40)

## 2022-06-28 LAB — COMPREHENSIVE METABOLIC PANEL
ALT: 24 IU/L (ref 0–32)
AST: 39 IU/L (ref 0–40)
Albumin/Globulin Ratio: 1.4 (ref 1.2–2.2)
Albumin: 4.2 g/dL (ref 3.9–4.9)
Alkaline Phosphatase: 198 IU/L — ABNORMAL HIGH (ref 44–121)
BUN/Creatinine Ratio: 14 (ref 12–28)
BUN: 11 mg/dL (ref 8–27)
Bilirubin Total: 1.1 mg/dL (ref 0.0–1.2)
CO2: 23 mmol/L (ref 20–29)
Calcium: 8.9 mg/dL (ref 8.7–10.3)
Chloride: 101 mmol/L (ref 96–106)
Creatinine, Ser: 0.76 mg/dL (ref 0.57–1.00)
Globulin, Total: 2.9 g/dL (ref 1.5–4.5)
Glucose: 99 mg/dL (ref 70–99)
Potassium: 4 mmol/L (ref 3.5–5.2)
Sodium: 137 mmol/L (ref 134–144)
Total Protein: 7.1 g/dL (ref 6.0–8.5)
eGFR: 87 mL/min/{1.73_m2} (ref >59)

## 2022-06-28 LAB — RPR: RPR Ser Ql: NONREACTIVE

## 2022-06-28 LAB — CBC
Hematocrit: 39 % (ref 34.0–46.6)
Hemoglobin: 13.9 g/dL (ref 11.1–15.9)
MCH: 33.1 pg — ABNORMAL HIGH (ref 26.6–33.0)
MCHC: 35.6 g/dL (ref 31.5–35.7)
MCV: 93 fL (ref 79–97)
Platelets: 120 10*3/uL — ABNORMAL LOW (ref 150–450)
RBC: 4.2 x10E6/uL (ref 3.77–5.28)
RDW: 12.7 % (ref 11.7–15.4)
WBC: 5.5 10*3/uL (ref 3.4–10.8)

## 2022-06-28 LAB — HIV-1 RNA QUANT-NO REFLEX-BLD: HIV-1 RNA Viral Load: <20 copies/mL

## 2022-06-28 LAB — URINE CYTOLOGY ANCILLARY ONLY
Chlamydia: NEGATIVE
Comment: NEGATIVE
Comment: NORMAL
Neisseria Gonorrhea: NEGATIVE

## 2022-06-28 LAB — T-HELPER CELL (CD4) - (RCID CLINIC ONLY)
CD4 % Helper T Cell: 26 % — ABNORMAL LOW (ref 33–65)
CD4 T Cell Abs: 270 /uL — ABNORMAL LOW (ref 400–1790)

## 2022-07-11 ENCOUNTER — Ambulatory Visit (INDEPENDENT_AMBULATORY_CARE_PROVIDER_SITE_OTHER): Payer: 59 | Admitting: Infectious Diseases

## 2022-07-11 ENCOUNTER — Encounter: Payer: Self-pay | Admitting: Infectious Diseases

## 2022-07-11 VITALS — BP 133/77 | HR 73 | Temp 98.1°F | Ht 67.0 in | Wt 234.4 lb

## 2022-07-11 DIAGNOSIS — E669 Obesity, unspecified: Secondary | ICD-10-CM

## 2022-07-11 DIAGNOSIS — E0590 Thyrotoxicosis, unspecified without thyrotoxic crisis or storm: Secondary | ICD-10-CM | POA: Diagnosis not present

## 2022-07-11 DIAGNOSIS — Z6836 Body mass index (BMI) 36.0-36.9, adult: Secondary | ICD-10-CM

## 2022-07-11 DIAGNOSIS — E785 Hyperlipidemia, unspecified: Secondary | ICD-10-CM

## 2022-07-11 DIAGNOSIS — B20 Human immunodeficiency virus [HIV] disease: Secondary | ICD-10-CM | POA: Diagnosis not present

## 2022-07-11 DIAGNOSIS — Z113 Encounter for screening for infections with a predominantly sexual mode of transmission: Secondary | ICD-10-CM

## 2022-07-11 MED ORDER — ROSUVASTATIN CALCIUM 5 MG PO TABS: 5.0000 mg | ORAL_TABLET | Freq: Every day | ORAL | 3 refills | Status: DC

## 2022-07-11 NOTE — Assessment & Plan Note (Signed)
She is doing well Has gotten RSV and flu vax Will get COVID vax in Dec.  Will see her back in 9 months, labs prior.  Offered/refused condoms.  We discussed her labs.

## 2022-07-11 NOTE — Assessment & Plan Note (Signed)
Appreciate her f/u with PCP and endo

## 2022-07-11 NOTE — Assessment & Plan Note (Signed)
Started on statin by PCP.  Her LFTs are normal.

## 2022-07-11 NOTE — Assessment & Plan Note (Signed)
Encouraged her to exercise and watch diet.

## 2022-07-11 NOTE — Progress Notes (Signed)
Subjective:    Patient ID: Courtney Escobar, female  DOB: 07/02/1956, 66 y.o.        MRN: [id number]   HPI 65 yo F who was seen previously in ID for LTBI in 2011 (HIV - then) as she was being screened for embrel (off since 05-2014).  She was treated with INH at that time.  She also has hx of RA, elevated lipids, chronic hepatitis (elevated alk phos, MR abd 02-27-15 normal). Has had 2 negative liver Bx per pt.   She was seen by her PCP 03-09-15 noted to have new HIV+ (checked as she had unexplained wt loss ~30# over 6 months). She was also noted to have an oral ulcer and thrush.    Her CD4 was found to be 12. She was seen in ID clinic 03-16-15, she was started on tivicay/Descovy.   She returned to hospital 03-27-15 to 04-02-15 with ARDS, IRIS.   She has rx hyperthyroid. Has gotten XRT. has endo f/u, dose has been stable.     She was started on flonase recently due to seasonal allergies. C/o bad taste from this.     Pap and Mammo (01-31-2022 at Dr Harrington Challenger office) (NL for both) Has gotten COVID twice (Jan 2022 and 2020).   Has gotten flu vax at local pharmacy this month.  Ready to retire- "I turn 75 in December". 10 yrs in her current work will be in May- will get $1000 bonus. Wants to travel with her partner, work on her house.    Was doing well with descovy-tivicay, then changed to biktarvy. She likes this new medication.  Recently started on rosuvastatin.   HIV 1 RNA Quant  Date Value  12/22/2021 NOT DETECTED copies/mL  12/21/2020 Not Detected Copies/mL  03/15/2020 <20 NOT DETECTED copies/mL   HIV-1 RNA Viral Load (copies/mL)  Date Value  06/27/2022 <20   CD4 T Cell Abs (/uL)  Date Value  06/27/2022 270 (L)  12/22/2021 532  12/21/2020 587     Health Maintenance  Topic Date Due  . FOOT EXAM  Never done  . OPHTHALMOLOGY EXAM  Never done  . Diabetic kidney evaluation - Urine ACR  Never done  . Zoster Vaccines- Shingrix (1 of 2) Never done  . TETANUS/TDAP  01/28/2013  .  HEMOGLOBIN A1C  09/15/2015  . PAP SMEAR-Modifier  08/03/2019  . COVID-19 Vaccine (3 - Pfizer risk series) 01/30/2020  . MAMMOGRAM  10/21/2020  . INFLUENZA VACCINE  04/18/2022  . Diabetic kidney evaluation - GFR measurement  06/28/2023  . COLONOSCOPY (Pts 45-50yr Insurance coverage will need to be confirmed)  11/17/2028  . Hepatitis C Screening  Completed  . HIV Screening  Completed  . HPV VACCINES  Aged Out      Review of Systems  Constitutional:  Negative for chills, fever and weight loss (has gained wt over last 3 years. since steady.).  Eyes:  Negative for blurred vision.  Respiratory:  Negative for cough and shortness of breath.   Cardiovascular:  Negative for chest pain.  Gastrointestinal:  Negative for constipation.  Genitourinary:  Negative for dysuria.  Psychiatric/Behavioral:  The patient does not have insomnia.    Please see HPI. All other systems reviewed and negative.     Objective:  Physical Exam Vitals reviewed.  Constitutional:      Appearance: Normal appearance. She is obese.  HENT:     Mouth/Throat:     Mouth: Mucous membranes are moist.     Pharynx: Oropharynx is clear.  No oropharyngeal exudate.  Eyes:     Extraocular Movements: Extraocular movements intact.     Pupils: Pupils are equal, round, and reactive to light.  Cardiovascular:     Rate and Rhythm: Normal rate and regular rhythm.  Pulmonary:     Effort: Pulmonary effort is normal.     Breath sounds: Normal breath sounds.  Abdominal:     General: Bowel sounds are normal. There is no distension.     Palpations: Abdomen is soft.     Tenderness: There is no abdominal tenderness.  Musculoskeletal:     Cervical back: Normal range of motion and neck supple.     Right lower leg: No edema.     Left lower leg: No edema.  Neurological:     General: No focal deficit present.     Mental Status: She is alert and oriented to person, place, and time.  Psychiatric:        Mood and Affect: Mood normal.           Assessment & Plan:

## 2022-11-13 ENCOUNTER — Encounter: Payer: Self-pay | Admitting: *Deleted

## 2023-01-19 ENCOUNTER — Other Ambulatory Visit: Payer: Self-pay | Admitting: *Deleted

## 2023-01-19 ENCOUNTER — Other Ambulatory Visit: Payer: Self-pay | Admitting: Infectious Diseases

## 2023-01-19 DIAGNOSIS — B20 Human immunodeficiency virus [HIV] disease: Secondary | ICD-10-CM

## 2023-01-19 MED ORDER — BIKTARVY 50-200-25 MG PO TABS: 1.0000 | ORAL_TABLET | Freq: Every day | ORAL | 3 refills | Status: DC

## 2023-03-12 ENCOUNTER — Ambulatory Visit: Payer: 59 | Admitting: Podiatry

## 2023-03-12 ENCOUNTER — Encounter: Payer: Self-pay | Admitting: Podiatry

## 2023-03-12 VITALS — BP 113/72 | HR 66 | Ht 67.0 in | Wt 230.0 lb

## 2023-03-12 DIAGNOSIS — B351 Tinea unguium: Secondary | ICD-10-CM

## 2023-03-12 MED ORDER — TERBINAFINE HCL 250 MG PO TABS: 250.0000 mg | ORAL_TABLET | Freq: Every day | ORAL | 0 refills | Status: DC

## 2023-03-14 NOTE — Progress Notes (Signed)
Subjective:   Patient ID: Courtney Escobar, female   DOB: 67 y.o.   MRN: [id number]   HPI Patient presents with concerns about thickness of her big toenails with patient wearing steel toe shoes on a daily basis    ROS      Objective:  Physical Exam  Neurovascular status intact range of motion adequate with patient found to have dystrophic deformity of the big toenails both feet that is more trauma than it is fungus most likely with moderate dry skin deformity     Assessment:  Probability that the majority of the condition and change in the nailbeds is related to trauma over fungus     Plan:  H&P reviewed educated her on this do not recommend nail removal as they do not hurt it is okay to use paint and I do not see any oral medicine being of value to her.  Reappoint if they start to get tender or changes occur

## 2023-03-28 ENCOUNTER — Other Ambulatory Visit (HOSPITAL_COMMUNITY)
Admission: RE | Admit: 2023-03-28 | Discharge: 2023-03-28 | Disposition: A | Discharge status: Home / Self Care | Payer: 59 | Source: Ambulatory Visit | Attending: Infectious Diseases | Admitting: Infectious Diseases

## 2023-03-28 ENCOUNTER — Other Ambulatory Visit: Payer: 59

## 2023-03-28 DIAGNOSIS — Z113 Encounter for screening for infections with a predominantly sexual mode of transmission: Secondary | ICD-10-CM | POA: Insufficient documentation

## 2023-03-28 DIAGNOSIS — E785 Hyperlipidemia, unspecified: Secondary | ICD-10-CM

## 2023-03-28 DIAGNOSIS — B20 Human immunodeficiency virus [HIV] disease: Secondary | ICD-10-CM

## 2023-03-29 LAB — URINE CYTOLOGY ANCILLARY ONLY
Chlamydia: NEGATIVE
Comment: NEGATIVE
Comment: NORMAL
Neisseria Gonorrhea: NEGATIVE

## 2023-03-29 LAB — T-HELPER CELL (CD4) - (RCID CLINIC ONLY)
CD4 % Helper T Cell: 27 % — ABNORMAL LOW (ref 33–65)
CD4 T Cell Abs: 653 /uL (ref 400–1790)

## 2023-03-30 LAB — COMPREHENSIVE METABOLIC PANEL
ALT: 22 IU/L (ref 0–32)
AST: 33 IU/L (ref 0–40)
Albumin: 3.8 g/dL — ABNORMAL LOW (ref 3.9–4.9)
Alkaline Phosphatase: 178 IU/L — ABNORMAL HIGH (ref 44–121)
BUN/Creatinine Ratio: 12 (ref 12–28)
BUN: 10 mg/dL (ref 8–27)
Bilirubin Total: 1.2 mg/dL (ref 0.0–1.2)
CO2: 23 mmol/L (ref 20–29)
Calcium: 8.5 mg/dL — ABNORMAL LOW (ref 8.7–10.3)
Chloride: 101 mmol/L (ref 96–106)
Creatinine, Ser: 0.84 mg/dL (ref 0.57–1.00)
Globulin, Total: 2.7 g/dL (ref 1.5–4.5)
Glucose: 68 mg/dL — ABNORMAL LOW (ref 70–99)
Potassium: 4.3 mmol/L (ref 3.5–5.2)
Sodium: 137 mmol/L (ref 134–144)
Total Protein: 6.5 g/dL (ref 6.0–8.5)
eGFR: 77 mL/min/{1.73_m2} (ref >59)

## 2023-03-30 LAB — LIPID PANEL
Chol/HDL Ratio: 2 ratio (ref 0.0–4.4)
Cholesterol, Total: 224 mg/dL — ABNORMAL HIGH (ref 100–199)
HDL: 111 mg/dL (ref >39)
LDL Chol Calc (NIH): 100 mg/dL — ABNORMAL HIGH (ref 0–99)
Triglycerides: 76 mg/dL (ref 0–149)
VLDL Cholesterol Cal: 13 mg/dL (ref 5–40)

## 2023-03-30 LAB — HIV-1 RNA QUANT-NO REFLEX-BLD
HIV-1 RNA Viral Load Log: 1.903 log10copy/mL
HIV-1 RNA Viral Load: 80 copies/mL

## 2023-03-30 LAB — CBC
Hematocrit: 39.4 % (ref 34.0–46.6)
Hemoglobin: 13.5 g/dL (ref 11.1–15.9)
MCH: 32.1 pg (ref 26.6–33.0)
MCHC: 34.3 g/dL (ref 31.5–35.7)
MCV: 94 fL (ref 79–97)
Platelets: 131 10*3/uL — ABNORMAL LOW (ref 150–450)
RBC: 4.2 x10E6/uL (ref 3.77–5.28)
RDW: 13.4 % (ref 11.7–15.4)
WBC: 6.5 10*3/uL (ref 3.4–10.8)

## 2023-03-30 LAB — RPR: RPR Ser Ql: NONREACTIVE

## 2023-04-11 ENCOUNTER — Encounter: Payer: 59 | Admitting: Infectious Diseases

## 2023-04-24 ENCOUNTER — Ambulatory Visit (INDEPENDENT_AMBULATORY_CARE_PROVIDER_SITE_OTHER): Payer: 59 | Admitting: Infectious Diseases

## 2023-04-24 ENCOUNTER — Encounter: Payer: Self-pay | Admitting: Infectious Diseases

## 2023-04-24 ENCOUNTER — Other Ambulatory Visit: Payer: Self-pay

## 2023-04-24 VITALS — BP 128/72 | HR 80 | Temp 97.9°F | Ht 67.0 in | Wt 229.9 lb

## 2023-04-24 DIAGNOSIS — B20 Human immunodeficiency virus [HIV] disease: Secondary | ICD-10-CM | POA: Diagnosis not present

## 2023-04-24 DIAGNOSIS — Z113 Encounter for screening for infections with a predominantly sexual mode of transmission: Secondary | ICD-10-CM

## 2023-04-24 DIAGNOSIS — E669 Obesity, unspecified: Secondary | ICD-10-CM

## 2023-04-24 DIAGNOSIS — Z6836 Body mass index (BMI) 36.0-36.9, adult: Secondary | ICD-10-CM

## 2023-04-24 DIAGNOSIS — E785 Hyperlipidemia, unspecified: Secondary | ICD-10-CM | POA: Diagnosis not present

## 2023-04-24 MED ORDER — BIKTARVY 50-200-25 MG PO TABS
1.0000 | ORAL_TABLET | Freq: Every day | ORAL | 3 refills | Status: DC
Start: 2023-04-24 — End: 2024-05-06

## 2023-04-24 NOTE — Assessment & Plan Note (Signed)
Appreciate PCP f/u Could consider increasing dose of crestor.

## 2023-04-24 NOTE — Progress Notes (Signed)
Subjective:    Patient ID: Courtney Escobar, female  DOB: May 10, 1956, 67 y.o.        MRN: [id number]   HPI 67 yo F who was seen previously in ID for LTBI in 2011 (HIV - then) as she was being screened for embrel (off since 05-2014).  She was treated with INH at that time.  She also has hx of RA, elevated lipids, chronic hepatitis (elevated alk phos, MR abd 02-27-15 normal). She was seen by her PCP 03-09-15 noted to have new HIV+.   Her CD4 was found to be 12. She was seen in ID clinic 03-16-15, she was started on tivicay/Descovy.   She returned to hospital 03-27-15 to 04-02-15 with ARDS, IRIS.   She has rx hyperthyroid. Has gotten XRT. has endo f/u, dose has been stable.    Pap and Mammo (2024 at Dr Tenny Craw office) (NL for both)   Has gotten flu vax at local pharmacy this month.  Ready to retire: March 2025.  Husband retired.  Was doing well with descovy-tivicay, then changed to biktarvy. Recently started on rosuvastatin.  Started on zepbound/tirzepetide and has lost 5# (has been on 1 month). Has been feeling well.  Has not been going to the gym as much as she would like.   HIV 1 RNA Quant  Date Value  12/22/2021 NOT DETECTED copies/mL  12/21/2020 Not Detected Copies/mL  03/15/2020 <20 NOT DETECTED copies/mL   HIV-1 RNA Viral Load (copies/mL)  Date Value  03/28/2023 80  06/27/2022 <20   CD4 T Cell Abs (/uL)  Date Value  03/28/2023 653  06/27/2022 270 (L)  12/22/2021 532     Health Maintenance  Topic Date Due  . FOOT EXAM  Never done  . OPHTHALMOLOGY EXAM  Never done  . Diabetic kidney evaluation - Urine ACR  Never done  . Zoster Vaccines- Shingrix (1 of 2) Never done  . DTaP/Tdap/Td (2 - Tdap) 01/28/2013  . HEMOGLOBIN A1C  09/15/2015  . PAP SMEAR-Modifier  08/03/2019  . COVID-19 Vaccine (3 - Pfizer risk series) 01/30/2020  . MAMMOGRAM  10/21/2020  . Pneumonia Vaccine 4+ Years old (2 of 2 - PCV) 09/05/2022  . DEXA SCAN  Never done  . INFLUENZA VACCINE  04/19/2023  .  Diabetic kidney evaluation - eGFR measurement  03/27/2024  . Colonoscopy  11/17/2028  . Hepatitis C Screening  Completed  . HIV Screening  Completed  . HPV VACCINES  Aged Out      Review of Systems  Constitutional:  Positive for weight loss. Negative for chills and fever.  Respiratory:  Negative for cough and shortness of breath.   Gastrointestinal:  Negative for constipation and diarrhea.  Genitourinary:  Negative for dysuria.  Psychiatric/Behavioral:  Negative for depression. The patient does not have insomnia.    Please see HPI. All other systems reviewed and negative.     Objective:  Physical Exam Vitals reviewed.  Constitutional:      General: She is not in acute distress.    Appearance: She is not toxic-appearing.  HENT:     Mouth/Throat:     Mouth: Mucous membranes are moist.     Pharynx: No oropharyngeal exudate.  Eyes:     Extraocular Movements: Extraocular movements intact.     Pupils: Pupils are equal, round, and reactive to light.  Cardiovascular:     Rate and Rhythm: Normal rate and regular rhythm.  Pulmonary:     Effort: Pulmonary effort is normal.  Breath sounds: Normal breath sounds.  Abdominal:     General: Bowel sounds are normal. There is no distension.     Palpations: Abdomen is soft.     Tenderness: There is no abdominal tenderness.  Musculoskeletal:        General: Normal range of motion.     Cervical back: Normal range of motion and neck supple.     Right lower leg: No edema.     Left lower leg: No edema.  Neurological:     General: No focal deficit present.     Mental Status: She is alert.          Assessment & Plan:

## 2023-04-24 NOTE — Assessment & Plan Note (Signed)
On lipid lowering therapy She is starting to lose wt, she feels well Appreciate PCP f/u.

## 2023-04-24 NOTE — Assessment & Plan Note (Signed)
She is doing well Defers mening vax Will get flu and covid when available Will see her back in 9 months Explained blip to her.  Offered/refused condoms.

## 2023-04-29 ENCOUNTER — Emergency Department (HOSPITAL_COMMUNITY): Payer: 59

## 2023-04-29 ENCOUNTER — Encounter (HOSPITAL_COMMUNITY): Payer: Self-pay

## 2023-04-29 ENCOUNTER — Other Ambulatory Visit: Payer: Self-pay

## 2023-04-29 ENCOUNTER — Emergency Department (HOSPITAL_COMMUNITY)
Admission: EM | Admit: 2023-04-29 | Discharge: 2023-04-29 | Disposition: A | Discharge status: Home / Self Care | Payer: 59 | Attending: Emergency Medicine | Admitting: Emergency Medicine

## 2023-04-29 DIAGNOSIS — W010XXA Fall on same level from slipping, tripping and stumbling without subsequent striking against object, initial encounter: Secondary | ICD-10-CM | POA: Diagnosis not present

## 2023-04-29 DIAGNOSIS — S4992XA Unspecified injury of left shoulder and upper arm, initial encounter: Secondary | ICD-10-CM | POA: Diagnosis present

## 2023-04-29 DIAGNOSIS — S42402A Unspecified fracture of lower end of left humerus, initial encounter for closed fracture: Secondary | ICD-10-CM | POA: Diagnosis not present

## 2023-04-29 MED ORDER — FENTANYL CITRATE PF 50 MCG/ML IJ SOSY
50.0000 ug | PREFILLED_SYRINGE | Freq: Once | INTRAMUSCULAR | Status: AC
  Administered 2023-04-29: 50 ug via INTRAMUSCULAR
  Filled 2023-04-29: qty 1

## 2023-04-29 MED ORDER — IBUPROFEN 800 MG PO TABS
800.0000 mg | ORAL_TABLET | Freq: Once | ORAL | Status: AC
  Administered 2023-04-29: 800 mg via ORAL
  Filled 2023-04-29: qty 1

## 2023-04-29 MED ORDER — OXYCODONE-ACETAMINOPHEN 5-325 MG PO TABS: 1.0000 | ORAL_TABLET | Freq: Four times a day (QID) | ORAL | 0 refills | Status: AC | PRN

## 2023-04-29 NOTE — ED Notes (Signed)
Ortho tech called and advised she is at [facility] she will be approx 15 minutes

## 2023-04-29 NOTE — ED Provider Notes (Signed)
[place] EMERGENCY DEPARTMENT AT [facility] Provider Note   CSN: [id number] Arrival date & time: 04/29/23  0430     History  Chief Complaint  Patient presents with   Fall    LUE pain    Courtney Escobar is a 67 y.o. female who presents with left upper arm pain after falling into a closet door last night.  She has immediate pain and swelling of her arm.  Denies any numbness or tingling in the fingertips. Is unable to move the left arm without pain.   Fall       Home Medications Prior to Admission medications   Medication Sig Start Date End Date Taking? Authorizing Provider  oxyCODONE-acetaminophen (PERCOCET/ROXICET) 5-325 MG tablet Take 1 tablet by mouth every 6 (six) hours as needed for up to 5 days for severe pain. 04/29/23 05/04/23 Yes Arabella Merles, PA-C  alclomethasone (ACLOVATE) 0.05 % cream alclometasone 0.05 % topical cream    [provider]  bictegravir-emtricitabine-tenofovir AF (BIKTARVY) 50-200-25 MG TABS tablet Take 1 tablet by mouth daily. 04/24/23   Ginnie Smart, MD  diclofenac (VOLTAREN) 75 MG EC tablet TK 1 T PO BID WF OR MILK Patient not taking: No sig reported 01/31/18   [provider]  diclofenac (VOLTAREN) 75 MG EC tablet Take 1 tablet (75 mg total) by mouth 2 (two) times daily. Patient not taking: Reported on 04/01/2020 09/25/19   Lenn Sink, DPM  HUMIRA PEN 40 MG/0.8ML PNKT  02/26/18   [provider]  levothyroxine (SYNTHROID, LEVOTHROID) 125 MCG tablet TK 1 T PO D OES 06/03/17   [provider]  metoprolol (LOPRESSOR) 50 MG tablet Take 1.5 tablets (75 mg total) by mouth 2 (two) times daily. Patient taking differently: Take 50 mg by mouth 2 (two) times daily. 04/02/15   Calvert Cantor, MD  rosuvastatin (CRESTOR) 5 MG tablet Take 1 tablet (5 mg total) by mouth daily. 07/11/22 07/11/23  Ginnie Smart, MD  terbinafine (LAMISIL) 250 MG tablet Take 1 tablet (250 mg total) by mouth daily. 03/12/23    Lenn Sink, DPM      Allergies    Codeine    Review of Systems   Review of Systems  Musculoskeletal:        Left arm pain    Physical Exam Updated Vital Signs BP 102/71 (BP Location: Right Arm)   Pulse 82   Temp 98.8 F (37.1 C) (Oral)   Resp 18   Ht 5\' 7"  (1.702 m)   Wt 104.3 kg   SpO2 98%   BMI 36.02 kg/m  Physical Exam Vitals and nursing note reviewed.  Constitutional:      Appearance: Normal appearance.  HENT:     Head: Atraumatic.  Cardiovascular:     Comments: 2+ radial pulse bilaterally Pulmonary:     Effort: Pulmonary effort is normal.  Musculoskeletal:     Comments: Swelling over the left distal humerus, No obvious deformity Patient unable to move left upper extremity without pain  Neurological:     General: No focal deficit present.     Mental Status: She is alert.     Comments: Sensation intact in the left upper extremity  Psychiatric:        Mood and Affect: Mood normal.        Behavior: Behavior normal.     ED Results / Procedures / Treatments   Labs (all labs ordered are listed, but only abnormal results are displayed) Labs Reviewed -  No data to display  EKG None  Radiology DG Humerus Left  Result Date: 04/29/2023 CLINICAL DATA:  67 year old female with history of trauma from a fall. Arm pain. EXAM: LEFT HUMERUS - 2+ VIEW COMPARISON:  No priors. FINDINGS: Two views of the left humerus demonstrate an acute spiral type fracture of the distal third of the humeral diaphysis with approximately one shaft width of posterior displacement of the distal fracture fragment, along with approximately 20 degrees of anterior/medial angulation. IMPRESSION: 1. Acute displaced mildly angulated spiral type fracture of the distal third of the humeral diaphysis, as above. Electronically Signed   By: Trudie Reed M.D.   On: 04/29/2023 06:46    Procedures Procedures    Medications Ordered in ED Medications  fentaNYL (SUBLIMAZE) injection 50 mcg (50 mcg  Intramuscular Given 04/29/23 0715)  ibuprofen (ADVIL) tablet 800 mg (800 mg Oral Given 04/29/23 1610)    ED Course/ Medical Decision Making/ A&P                                 Medical Decision Making Amount and/or Complexity of Data Reviewed Radiology: ordered.  Risk Prescription drug management.   67 y.o. female presents to the ED for concern of left arm pain  Differential diagnosis includes but is not limited to fracture, dislocation, soft tissue injury  ED Course:  Patient presents after falling into a closet door, now with left arm pain.  She was found to have a left spiral mildly displaced distal humerus fracture.  Contacted orthopedics who recommended she be placed into a coaptation splint and follow-up with their office tomorrow. She was placed in splint and discharged home She is given fentanyl and ibuprofen here for pain.   Impression: Distal left humerus spiral fracture, mildly displaced and angulated  Disposition:  The patient was discharged home with instructions to follow-up with orthopedics tomorrow, information provided in discharge paperwork.  She is prescribed Percocet for pain control at home and also instructed on ibuprofen use. Return precautions given.   Imaging Studies ordered: I ordered imaging studies including x-ray left humerus I independently visualized the imaging with scope of interpretation limited to determining acute life threatening conditions related to emergency care. Imaging showed displaced and mildly angulated spiral fracture of the left distal third of the humerus  I agree with the radiologist interpretation   Consultations Obtained: I requested consultation with orthopedics,  and discussed lab and imaging findings as well as pertinent plan - they recommend: placing patient into coaptation splint and following up tomorrow with their office.         Final Clinical Impression(s) / ED Diagnoses Final diagnoses:  Closed fracture of  distal end of left humerus, unspecified fracture morphology, initial encounter    Rx / DC Orders ED Discharge Orders          Ordered    oxyCODONE-acetaminophen (PERCOCET/ROXICET) 5-325 MG tablet  Every 6 hours PRN        04/29/23 0815              Arabella Merles, PA-C 04/29/23 9604    Margarita Grizzle, MD 04/29/23 1507

## 2023-04-29 NOTE — Progress Notes (Signed)
Orthopedic Tech Progress Note Patient Details:  Courtney Escobar 1955-10-10 [id number]  Ortho Devices Type of Ortho Device: Coapt Ortho Device/Splint Location: LUE Ortho Device/Splint Interventions: Ordered, Application, Adjustment   Post Interventions Patient Tolerated: Well Instructions Provided: Care of device  [name] 04/29/2023, 8:45 AM

## 2023-04-29 NOTE — ED Triage Notes (Signed)
Arrived POV. Pt got up to go to the bathroom this AM and tripped and fell into the closet door. Pt c/o left upper arm pain. Denies taking any meds for pain. Denies hitting head and denies loc. Not on blood thinners.

## 2023-04-29 NOTE — Discharge Instructions (Addendum)
You have been found to have a fracture of your left humerus.  Please follow-up with orthopedics tomorrow morning.  Information has been provided below for their office, call them to schedule an appointment.  You have been prescribed Percocet-this is a narcotic/controlled substance medication that has potential addicting qualities.  You may take 1 tablet every 6 hours as needed for severe pain.  Do not drive or operate heavy machinery when taking this medicine as it can be sedating. Do not drink alcohol or take other sedating medications when taking this medicine for safety reasons.  Keep this out of reach of small children.  Please be aware this medicine has Tylenol in it (325 mg/tab) do not exceed the maximum dose of Tylenol in a day per over the counter recommendations should you decide to supplement with Tylenol over the counter.  You may also use up to 800mg  ibuprofen every 6 hours as needed for pain.  Return to the ER if you have numbness or tingling in your fingers, you have uncontrolled pain, you feel like you are left hand is very cold, any other new or concerning symptoms.

## 2024-01-07 ENCOUNTER — Telehealth: Payer: Self-pay | Admitting: Family Medicine

## 2024-01-07 NOTE — Telephone Encounter (Signed)
 Spoke with the patient.    MRN: [id number]  Date: 01/22/2024 Status: Sch  Time: 4:00 PM Length: 15  Visit Type: [facility] INFECTIOUS DISEASE [2216] Copay: $20.00  Provider: Sandie Cross, MD      Copied from CRM [id number]. Topic: Appointments - Appointment Scheduling >> Jan 04, 2024 10:05 AM Madelyne Schiff wrote: Please call Pt Nieland, she would like to set up an appt with Dr Alwin Baars.  (Will not let E2C2 schedule)  Please advise.

## 2024-01-08 ENCOUNTER — Other Ambulatory Visit (INDEPENDENT_AMBULATORY_CARE_PROVIDER_SITE_OTHER)

## 2024-01-08 ENCOUNTER — Other Ambulatory Visit (HOSPITAL_COMMUNITY)
Admission: RE | Admit: 2024-01-08 | Discharge: 2024-01-08 | Disposition: A | Discharge status: Home / Self Care | Source: Ambulatory Visit | Attending: Infectious Diseases | Admitting: Infectious Diseases

## 2024-01-08 DIAGNOSIS — E785 Hyperlipidemia, unspecified: Secondary | ICD-10-CM

## 2024-01-08 DIAGNOSIS — Z113 Encounter for screening for infections with a predominantly sexual mode of transmission: Secondary | ICD-10-CM | POA: Diagnosis present

## 2024-01-08 DIAGNOSIS — B20 Human immunodeficiency virus [HIV] disease: Secondary | ICD-10-CM

## 2024-01-09 LAB — URINE CYTOLOGY ANCILLARY ONLY
Chlamydia: NEGATIVE
Comment: NEGATIVE
Comment: NORMAL
Neisseria Gonorrhea: NEGATIVE

## 2024-01-09 LAB — T-HELPER CELL (CD4) - (RCID CLINIC ONLY)
CD4 % Helper T Cell: 28 % — ABNORMAL LOW (ref 33–65)
CD4 T Cell Abs: 713 /uL (ref 400–1790)

## 2024-01-10 LAB — LIPID PANEL
Chol/HDL Ratio: 2 ratio (ref 0.0–4.4)
Cholesterol, Total: 202 mg/dL — ABNORMAL HIGH (ref 100–199)
HDL: 100 mg/dL (ref >39)
LDL Chol Calc (NIH): 87 mg/dL (ref 0–99)
Triglycerides: 84 mg/dL (ref 0–149)
VLDL Cholesterol Cal: 15 mg/dL (ref 5–40)

## 2024-01-10 LAB — CBC
Hematocrit: 39.7 % (ref 34.0–46.6)
Hemoglobin: 13.7 g/dL (ref 11.1–15.9)
MCH: 33.2 pg — ABNORMAL HIGH (ref 26.6–33.0)
MCHC: 34.5 g/dL (ref 31.5–35.7)
MCV: 96 fL (ref 79–97)
Platelets: 143 10*3/uL — ABNORMAL LOW (ref 150–450)
RBC: 4.13 x10E6/uL (ref 3.77–5.28)
RDW: 13.2 % (ref 11.7–15.4)
WBC: 6.4 10*3/uL (ref 3.4–10.8)

## 2024-01-10 LAB — COMPREHENSIVE METABOLIC PANEL WITH GFR
ALT: 26 IU/L (ref 0–32)
AST: 33 IU/L (ref 0–40)
Albumin: 3.9 g/dL (ref 3.9–4.9)
Alkaline Phosphatase: 157 IU/L — ABNORMAL HIGH (ref 44–121)
BUN/Creatinine Ratio: 17 (ref 12–28)
BUN: 13 mg/dL (ref 8–27)
Bilirubin Total: 1.4 mg/dL — ABNORMAL HIGH (ref 0.0–1.2)
CO2: 25 mmol/L (ref 20–29)
Calcium: 9.4 mg/dL (ref 8.7–10.3)
Chloride: 102 mmol/L (ref 96–106)
Creatinine, Ser: 0.76 mg/dL (ref 0.57–1.00)
Globulin, Total: 2.8 g/dL (ref 1.5–4.5)
Glucose: 86 mg/dL (ref 70–99)
Potassium: 4.3 mmol/L (ref 3.5–5.2)
Sodium: 140 mmol/L (ref 134–144)
Total Protein: 6.7 g/dL (ref 6.0–8.5)
eGFR: 86 mL/min/{1.73_m2} (ref >59)

## 2024-01-10 LAB — HIV-1 RNA QUANT-NO REFLEX-BLD
HIV-1 RNA Viral Load Log: 1.602 {Log_copies}/mL
HIV-1 RNA Viral Load: 40 {copies}/mL

## 2024-01-10 LAB — RPR: RPR Ser Ql: NONREACTIVE

## 2024-01-18 NOTE — Telephone Encounter (Signed)
 PT has been called and resch to 03/12/2024  Copied from CRM #[id number]. Topic: Appointments - Appointment Scheduling >> Jan 04, 2024 10:05 AM Madelyne Schiff wrote: Please call Pt Courtney Escobar, she would like to set up an appt with Dr Alwin Baars.  (Will not let E2C2 schedule)  Please advise. >> Jan 17, 2024  4:18 PM [name] wrote: Patient called back due to app for 06/17 will not work for her. She is free on 06/19 anytime. She was confused due to app were set on 05/06 and 05/20 in her MyChart. She needs call back to reschedule this and call if anything changes so it is convenient  for her as well.  [phone]

## 2024-01-22 ENCOUNTER — Encounter: Admitting: Infectious Diseases

## 2024-02-05 ENCOUNTER — Encounter: Admitting: Infectious Diseases

## 2024-02-06 ENCOUNTER — Other Ambulatory Visit: Payer: Self-pay

## 2024-02-06 ENCOUNTER — Emergency Department (HOSPITAL_COMMUNITY)
Admission: EM | Admit: 2024-02-06 | Discharge: 2024-02-07 | Disposition: A | Discharge status: Home / Self Care | Attending: Emergency Medicine | Admitting: Emergency Medicine

## 2024-02-06 ENCOUNTER — Encounter (HOSPITAL_COMMUNITY): Payer: Self-pay

## 2024-02-06 DIAGNOSIS — E162 Hypoglycemia, unspecified: Secondary | ICD-10-CM | POA: Diagnosis present

## 2024-02-06 DIAGNOSIS — E11649 Type 2 diabetes mellitus with hypoglycemia without coma: Secondary | ICD-10-CM | POA: Diagnosis not present

## 2024-02-06 LAB — COMPREHENSIVE METABOLIC PANEL WITH GFR
ALT: 31 U/L (ref 0–44)
AST: 46 U/L — ABNORMAL HIGH (ref 15–41)
Albumin: 3.7 g/dL (ref 3.5–5.0)
Alkaline Phosphatase: 125 U/L (ref 38–126)
Anion gap: 8 (ref 5–15)
BUN: 12 mg/dL (ref 8–23)
CO2: 23 mmol/L (ref 22–32)
Calcium: 8.3 mg/dL — ABNORMAL LOW (ref 8.9–10.3)
Chloride: 102 mmol/L (ref 98–111)
Creatinine, Ser: 0.59 mg/dL (ref 0.44–1.00)
GFR, Estimated: >60 mL/min (ref >60)
Glucose, Bld: 159 mg/dL — ABNORMAL HIGH (ref 70–99)
Potassium: 3.1 mmol/L — ABNORMAL LOW (ref 3.5–5.1)
Sodium: 133 mmol/L — ABNORMAL LOW (ref 135–145)
Total Bilirubin: 2 mg/dL — ABNORMAL HIGH (ref 0.0–1.2)
Total Protein: 6.8 g/dL (ref 6.5–8.1)

## 2024-02-06 LAB — CBC WITH DIFFERENTIAL/PLATELET
Abs Immature Granulocytes: 0.02 10*3/uL (ref 0.00–0.07)
Basophils Absolute: 0 10*3/uL (ref 0.0–0.1)
Basophils Relative: 0 %
Eosinophils Absolute: 0 10*3/uL (ref 0.0–0.5)
Eosinophils Relative: 0 %
HCT: 36.9 % (ref 36.0–46.0)
Hemoglobin: 12.9 g/dL (ref 12.0–15.0)
Immature Granulocytes: 0 %
Lymphocytes Relative: 11 %
Lymphs Abs: 0.7 10*3/uL (ref 0.7–4.0)
MCH: 33.7 pg (ref 26.0–34.0)
MCHC: 35 g/dL (ref 30.0–36.0)
MCV: 96.3 fL (ref 80.0–100.0)
Monocytes Absolute: 0.6 10*3/uL (ref 0.1–1.0)
Monocytes Relative: 10 %
Neutro Abs: 4.8 10*3/uL (ref 1.7–7.7)
Neutrophils Relative %: 79 %
Platelets: 103 10*3/uL — ABNORMAL LOW (ref 150–400)
RBC: 3.83 MIL/uL — ABNORMAL LOW (ref 3.87–5.11)
RDW: 13.2 % (ref 11.5–15.5)
WBC: 6.2 10*3/uL (ref 4.0–10.5)
nRBC: 0 % (ref 0.0–0.2)

## 2024-02-06 LAB — CBG MONITORING, ED
Glucose-Capillary: 160 mg/dL — ABNORMAL HIGH (ref 70–99)
Glucose-Capillary: 148 mg/dL — ABNORMAL HIGH (ref 70–99)

## 2024-02-06 NOTE — ED Provider Notes (Signed)
 [place] EMERGENCY DEPARTMENT AT [facility] Provider Note   CSN: [id number] Arrival date & time: 02/06/24  2103     History  Chief Complaint  Patient presents with   Hypoglycemia    Courtney Escobar is a 68 y.o. female with medical history of AIDS, anxiety, arthritis, diabetes, GERD, HIV.  Patient presents to ED for evaluation of hypoglycemia.  Reports that she was in her usual state of health earlier this evening.  Reports that she was with her significant other going to [employer] to get food.  States that she was at [employer], ordered food, and then her and her significant other returned to the car.  She states that she got into the car and that is lasting she remembers.  Her significant other reports that she began to act slightly somnolent and lethargic.  Reports that she backed very slowly out of the parking spot and then began to drive as if she was confused as to where she was going.  He states that she then drove to a different restaurant and pulled into the parking lot multiple times.  At this time, they switched drivers as patient husband reported that he thought patient was acting odd.  Patient was able to ambulate from driver side to passenger side.  Patient husband reports that they then drove home and the patient would not get out of the car so the patient husband went inside to eat his dinner.  He states that he then called his daughter and advised her what it happened and his daughter advised him that he should go out and check on his wife.  Patient husband reports that patient was sitting partially out of the car with her legs onto the floor.  He reports he attempted to help her out of the car but she just scratched his arm.  He states that this point he called EMS.  On EMS arrival, the patient CBG was 35.  They administered 25 g of D10 and her CBG elevated to 210.  Patient reports that she does not remember these events.  She states that she has eaten her usual meals today.   States she ate a breakfast biscuit from [employer], protein drink for lunch.  She states that this is typical for herself.  She states that she has recently started taking Zepbound but has not had an injection in 2 weeks.  She began taking this in January.  She also states that before she left to go to [employer], she took her injection of Humira but states she has taken this for over 1 year.  She reports she currently feels she is in her usual state of health.  Denies any symptoms.  She denies any recent excessive urination, excessive hunger, excessive thirst.  Denies any shortness of breath.  Denies history of diabetes.   Hypoglycemia      Home Medications Prior to Admission medications   Medication Sig Start Date End Date Taking? Authorizing Provider  adalimumab (HUMIRA, 2 PEN,) 40 MG/0.4ML pen Inject 40 mg into the skin every 14 (fourteen) days.   Yes [provider]  bictegravir-emtricitabine -tenofovir  AF (BIKTARVY ) 50-200-25 MG TABS tablet Take 1 tablet by mouth daily. 04/24/23  Yes Sandie Cross, MD  levothyroxine (SYNTHROID ) 75 MCG tablet Take 75 mcg by mouth daily before breakfast.   Yes [provider]  metoprolol  (LOPRESSOR ) 50 MG tablet Take 1.5 tablets (75 mg total) by mouth 2 (two) times daily. 04/02/15  Yes Rizwan, Saima, MD  rosuvastatin  (CRESTOR ) 5 MG tablet Take 5 mg by mouth daily.   Yes [provider]  ZEPBOUND 7.5 MG/0.5ML Pen Inject 7.5 mg into the skin once a week. 01/20/24  Yes [provider]  diclofenac  (VOLTAREN ) 75 MG EC tablet TK 1 T PO BID WF OR MILK Patient not taking: No sig reported 01/31/18   [provider]  diclofenac  (VOLTAREN ) 75 MG EC tablet Take 1 tablet (75 mg total) by mouth 2 (two) times daily. Patient not taking: Reported on 04/01/2020 09/25/19   Brandt Cake, DPM  rosuvastatin  (CRESTOR ) 5 MG tablet Take 1 tablet (5 mg total) by mouth daily. 07/11/22 07/11/23  Sandie Cross, MD  terbinafine  (LAMISIL )  250 MG tablet Take 1 tablet (250 mg total) by mouth daily. Patient not taking: Reported on 02/07/2024 03/12/23   Brandt Cake, DPM      Allergies    Codeine    Review of Systems   Review of Systems  All other systems reviewed and are negative.   Physical Exam Updated Vital Signs BP 101/65   Pulse 73   Temp 98 F (36.7 C)   Resp 18   SpO2 96%  Physical Exam Vitals and nursing note reviewed.  Constitutional:      General: She is not in acute distress.    Appearance: She is well-developed.  HENT:     Head: Normocephalic and atraumatic.  Eyes:     Conjunctiva/sclera: Conjunctivae normal.  Cardiovascular:     Rate and Rhythm: Normal rate and regular rhythm.     Heart sounds: No murmur heard. Pulmonary:     Effort: Pulmonary effort is normal. No respiratory distress.     Breath sounds: Normal breath sounds.  Abdominal:     Palpations: Abdomen is soft.     Tenderness: There is no abdominal tenderness.  Musculoskeletal:        General: No swelling.     Cervical back: Neck supple.  Skin:    General: Skin is warm and dry.     Capillary Refill: Capillary refill takes less than 2 seconds.  Neurological:     Mental Status: She is alert and oriented to person, place, and time. Mental status is at baseline.     Comments: CN III through XII intact.  Intact finger-to-nose, heel-to-shin.  No pronator drift.  Follows commands appropriately.  Equal strength bilateral upper extremities.  Equal strength bilateral lower extremities.  Psychiatric:        Mood and Affect: Mood normal.     ED Results / Procedures / Treatments   Labs (all labs ordered are listed, but only abnormal results are displayed) Labs Reviewed  CBC WITH DIFFERENTIAL/PLATELET - Abnormal; Notable for the following components:      Result Value   RBC 3.83 (*)    Platelets 103 (*)    All other components within normal limits  COMPREHENSIVE METABOLIC PANEL WITH GFR - Abnormal; Notable for the following  components:   Sodium 133 (*)    Potassium 3.1 (*)    Glucose, Bld 159 (*)    Calcium  8.3 (*)    AST 46 (*)    Total Bilirubin 2.0 (*)    All other components within normal limits  URINALYSIS, ROUTINE W REFLEX MICROSCOPIC - Abnormal; Notable for the following components:   Specific Gravity, Urine 1.004 (*)    All other components within normal limits  CBG MONITORING, ED - Abnormal; Notable for the following components:   Glucose-Capillary 160 (*)  All other components within normal limits  CBG MONITORING, ED - Abnormal; Notable for the following components:   Glucose-Capillary 148 (*)    All other components within normal limits    EKG None  Radiology No results found.  Procedures Procedures   Medications Ordered in ED Medications  potassium chloride  SA (KLOR-CON  M) CR tablet 40 mEq (40 mEq Oral Given 02/07/24 0129)    ED Course/ Medical Decision Making/ A&P  Medical Decision Making Amount and/or Complexity of Data Reviewed Labs: ordered.  Risk Prescription drug management.   68 year old female presents for evaluation of hyperglycemia.  Please see HPI for further details.  On examination patient alert and oriented x 3.  Afebrile and nontachycardic.  Her lung sounds are clear bilaterally, she is not hypoxic.  Her abdomen is soft and compressible.  Her neurological examination is at baseline without focal neurodeficits.  She follows commands appropriately.  Will check CBC, CMP, urinalysis and point-of-care CBG.  CBC without leukocytosis or anemia.  Metabolic panel with sodium 133, potassium 3.1 repleted with 40 mEq oral potassium, glucose 159, total bilirubin 2.0.  Patient denies any abdominal pain.  Has had elevated bilirubin in the past.  Urinalysis unremarkable, no glucose.  At this time, patient workup reassuring.  Cause of hypoglycemia at this time is uncertain.  Will have patient follow-up with her endocrinology team.  Will encourage patient to eat frequent  small meals until seen by endocrinology.  Have given return precautions and she and her husband both voiced understanding.  Stable to discharge.   Final Clinical Impression(s) / ED Diagnoses Final diagnoses:  Hypoglycemia    Rx / DC Orders ED Discharge Orders     None         Adel Aden, PA-C 02/07/24 0140    Rory Collard, MD 02/07/24 [phone]

## 2024-02-06 NOTE — ED Triage Notes (Addendum)
 Pt BIB EMS from home for hypoglycemia. On arrival, pt was lethargic and warm to touch. Pt cbg was 35 and per husband pt's last meal was yesterday.   No hx of diabetes  18g LAC 20g R Forearm  25 grams D10 given. CBG went to 210

## 2024-02-07 LAB — URINALYSIS, ROUTINE W REFLEX MICROSCOPIC
Bilirubin Urine: NEGATIVE
Glucose, UA: NEGATIVE mg/dL
Hgb urine dipstick: NEGATIVE
Ketones, ur: NEGATIVE mg/dL
Leukocytes,Ua: NEGATIVE
Nitrite: NEGATIVE
Protein, ur: NEGATIVE mg/dL
Specific Gravity, Urine: 1.004 — ABNORMAL LOW (ref 1.005–1.030)
pH: 5 (ref 5.0–8.0)

## 2024-02-07 MED ORDER — POTASSIUM CHLORIDE CRYS ER 20 MEQ PO TBCR
40.0000 meq | EXTENDED_RELEASE_TABLET | Freq: Once | ORAL | Status: AC
  Administered 2024-02-07: 40 meq via ORAL
  Filled 2024-02-07: qty 2

## 2024-02-07 NOTE — Discharge Instructions (Signed)
 It was a pleasure taking part in your care.  As discussed, please follow-up with your PCP as well as your endocrinology team.  Please begin to eat small and frequent meals to ensure that your blood sugar remains stable.  Please return to the ED with any new or worsening symptoms.

## 2024-02-07 NOTE — ED Notes (Signed)
 Pt walked to bathroom with no complaints, RN and MD notified

## 2024-03-04 ENCOUNTER — Encounter: Admitting: Infectious Diseases

## 2024-03-11 ENCOUNTER — Encounter: Admitting: Infectious Diseases

## 2024-03-12 ENCOUNTER — Encounter: Admitting: Infectious Diseases

## 2024-03-19 ENCOUNTER — Other Ambulatory Visit: Payer: Self-pay

## 2024-03-19 ENCOUNTER — Ambulatory Visit: Admitting: Infectious Diseases

## 2024-03-19 VITALS — BP 138/73 | HR 62 | Temp 98.3°F | Resp 28 | Ht 67.0 in | Wt 218.8 lb

## 2024-03-19 DIAGNOSIS — E785 Hyperlipidemia, unspecified: Secondary | ICD-10-CM | POA: Diagnosis not present

## 2024-03-19 DIAGNOSIS — M069 Rheumatoid arthritis, unspecified: Secondary | ICD-10-CM | POA: Diagnosis not present

## 2024-03-19 DIAGNOSIS — B20 Human immunodeficiency virus [HIV] disease: Secondary | ICD-10-CM

## 2024-03-19 DIAGNOSIS — R739 Hyperglycemia, unspecified: Secondary | ICD-10-CM

## 2024-03-19 DIAGNOSIS — Z113 Encounter for screening for infections with a predominantly sexual mode of transmission: Secondary | ICD-10-CM

## 2024-03-19 DIAGNOSIS — Z79899 Other long term (current) drug therapy: Secondary | ICD-10-CM

## 2024-03-19 MED ORDER — ROSUVASTATIN CALCIUM 5 MG PO TABS: 5.0000 mg | ORAL_TABLET | Freq: Every day | ORAL | Status: AC

## 2024-03-19 NOTE — Progress Notes (Signed)
 Subjective:    Patient ID: Courtney Escobar, female  DOB: [birth year], 68 y.o.        MRN: [id number]   HPI 68 yo F who was seen previously in ID for LTBI in 2011 (HIV - then) as she was being screened for embrel (off since 05-2014).  She was treated with INH at that time.  She also has hx of RA, elevated lipids, chronic hepatitis (elevated alk phos, MR abd 02-27-15 normal). She was seen by her PCP 03-09-15 noted to have new HIV+.   Her CD4 was found to be 12. She was seen in ID clinic 03-16-15, she was started on tivicay /Descovy .   She returned to hospital 03-27-15 to 04-02-15 with ARDS, IRIS.   She has rx hyperthyroid. Has gotten XRT. has endo f/u, dose has been stable.    Pap and Mammo (2024 at Dr Okey office) (NL for both)   Has gotten flu vax at local pharmacy this month.  Ready to retire: March 2025 now has appt 04-29-64 with social security (and then may work til Jan). Husband retired.  Was doing well with descovy -tivicay , then changed to biktarvy . On rosuvastatin .  Started on zepbound/tirzepetide and has lost 5# (has been on 1 month)  Clemens 04-29-23 and fractured L humerus. She now has a plate and 8 screws.  No pain now.  Has f/u with pcp regarding her zepbound (had to decrease due to sfx).   Colon pending. Mammogram and pap done 02-2024   HIV 1 RNA Quant  Date Value  12/22/2021 NOT DETECTED copies/mL  12/21/2020 Not Detected Copies/mL  03/15/2020 <20 NOT DETECTED copies/mL   HIV-1 RNA Viral Load (copies/mL)  Date Value  01/08/2024 40  03/28/2023 80  06/27/2022 <20   CD4 T Cell Abs (/uL)  Date Value  01/08/2024 713  03/28/2023 653  06/27/2022 270 (L)     Health Maintenance  Topic Date Due  . FOOT EXAM  Never done  . OPHTHALMOLOGY EXAM  Never done  . Diabetic kidney evaluation - Urine ACR  Never done  . Zoster Vaccines- Shingrix (1 of 2) Never done  . DTaP/Tdap/Td (2 - Tdap) 01/28/2013  . HEMOGLOBIN A1C  09/15/2015  . Pneumococcal Vaccine: 50+ Years (3 of 3 -  PCV) 03/15/2016  . Hepatitis B Vaccines (1 of 3 - Risk 3-dose series) 09/05/2017  . COVID-19 Vaccine (3 - Pfizer risk series) 01/30/2020  . MAMMOGRAM  10/21/2020  . DEXA SCAN  Never done  . INFLUENZA VACCINE  04/18/2024  . Diabetic kidney evaluation - eGFR measurement  02/05/2025  . Colonoscopy  11/28/2028  . Hepatitis C Screening  Completed  . HPV VACCINES  Aged Out  . Meningococcal B Vaccine  Aged Out      Review of Systems  Constitutional:  Negative for chills, fever and weight loss (232--> 216 nadir).  Respiratory:  Negative for cough and shortness of breath.   Gastrointestinal:  Negative for constipation and diarrhea.  Genitourinary:  Negative for dysuria.  Psychiatric/Behavioral:  The patient does not have insomnia.     Please see HPI. All other systems reviewed and negative.     Objective:  Physical Exam Constitutional:      General: She is not in acute distress.    Appearance: Normal appearance. She is not ill-appearing.  HENT:     Mouth/Throat:     Mouth: Mucous membranes are moist.     Pharynx: No oropharyngeal exudate.  Eyes:     Extraocular Movements: Extraocular movements  intact.     Pupils: Pupils are equal, round, and reactive to light.  Cardiovascular:     Rate and Rhythm: Normal rate and regular rhythm.  Pulmonary:     Effort: Pulmonary effort is normal.     Breath sounds: Normal breath sounds.  Abdominal:     General: Bowel sounds are normal. There is no distension.     Palpations: Abdomen is soft.     Tenderness: There is no abdominal tenderness.  Musculoskeletal:     Cervical back: Normal range of motion and neck supple.     Right lower leg: No edema.     Left lower leg: No edema.  Neurological:     General: No focal deficit present.     Mental Status: She is alert.  Psychiatric:        Mood and Affect: Mood normal.           Assessment & Plan:

## 2024-03-19 NOTE — Assessment & Plan Note (Signed)
 Doing well (she's going to see Beyonce next weekend!) No change in ART- biktarvy  On statin.  PCV20 at drug store. Covid and flu when available.  Rtc in 9 months.

## 2024-03-19 NOTE — Assessment & Plan Note (Signed)
 Doing well on humira. No recent flares.

## 2024-03-19 NOTE — Assessment & Plan Note (Signed)
 Related to her zepbound doses.  Will go on lower dose.

## 2024-03-19 NOTE — Assessment & Plan Note (Signed)
 Doing well, on statin Lab Results  Component Value Date   CHOL 202 (H) 01/08/2024   HDL 100 01/08/2024   LDLCALC 87 01/08/2024   TRIG 84 01/08/2024   CHOLHDL 2.0 01/08/2024       Latest Ref Rng & Units 02/06/2024   11:08 PM 01/08/2024    3:36 PM 03/28/2023    3:54 PM  Hepatic Function  Total Protein 6.5 - 8.1 g/dL 6.8  6.7  6.5   Albumin 3.5 - 5.0 g/dL 3.7  3.9  3.8   AST 15 - 41 U/L 46  33  33   ALT 0 - 44 U/L 31  26  22    Alk Phosphatase 38 - 126 U/L 125  157  178   Total Bilirubin 0.0 - 1.2 mg/dL 2.0  1.4  1.2

## 2024-05-06 ENCOUNTER — Other Ambulatory Visit: Payer: Self-pay

## 2024-05-06 DIAGNOSIS — B20 Human immunodeficiency virus [HIV] disease: Secondary | ICD-10-CM

## 2024-05-07 MED ORDER — BIKTARVY 50-200-25 MG PO TABS
1.0000 | ORAL_TABLET | Freq: Every day | ORAL | 3 refills | Status: AC
Start: 2024-05-07 — End: ?

## 2024-10-14 ENCOUNTER — Telehealth: Payer: Self-pay | Admitting: Infectious Diseases

## 2024-10-14 NOTE — Telephone Encounter (Signed)
 Please refer to message below.  Attempted to contact patient, but no answer.  Left message to call our office back and ask to speak with Charsetta, Hme, or Jada to schedule future appointment with Dr. Eben.    Copied from CRM #[id number]. Topic: Appointments - Scheduling Inquiry for Clinic >> Oct 10, 2024  8:55 AM Miquel SAILOR wrote: Reason for CRM: PT requesting visit for PCP hatcher. Need call back to schedule [phone]

## 2024-10-29 ENCOUNTER — Other Ambulatory Visit: Payer: Self-pay

## 2024-11-11 ENCOUNTER — Encounter: Payer: Self-pay | Admitting: Infectious Diseases
